# Patient Record
Sex: Male | Born: 1965 | Race: White | Hispanic: No | Marital: Single | Smoking: Never smoker
Health system: Southern US, Community
[De-identification: ages and names within clinical notes are randomized; demographics above are authoritative.]

## PROBLEM LIST (undated history)

## (undated) ENCOUNTER — Emergency Department (HOSPITAL_BASED_OUTPATIENT_CLINIC_OR_DEPARTMENT_OTHER): Discharge status: Absent without leave | Payer: Self-pay

## (undated) DIAGNOSIS — Z8669 Personal history of other diseases of the nervous system and sense organs: Secondary | ICD-10-CM

## (undated) DIAGNOSIS — E119 Type 2 diabetes mellitus without complications: Secondary | ICD-10-CM

## (undated) DIAGNOSIS — M1990 Unspecified osteoarthritis, unspecified site: Secondary | ICD-10-CM

## (undated) DIAGNOSIS — R519 Headache, unspecified: Secondary | ICD-10-CM

## (undated) DIAGNOSIS — G510 Bell's palsy: Secondary | ICD-10-CM

## (undated) HISTORY — DX: Personal history of other diseases of the nervous system and sense organs: Z86.69

## (undated) HISTORY — DX: Bell's palsy: G51.0

## (undated) HISTORY — PX: VENTRAL HERNIA REPAIR: SHX424

## (undated) HISTORY — PX: KNEE RECONSTRUCTION: SHX5883

---

## 2006-06-15 ENCOUNTER — Encounter: Admission: RE | Admit: 2006-06-15 | Discharge: 2006-06-15 | Payer: Self-pay | Admitting: Internal Medicine

## 2012-08-03 ENCOUNTER — Emergency Department: Payer: Self-pay | Admitting: Emergency Medicine

## 2018-05-12 DIAGNOSIS — I639 Cerebral infarction, unspecified: Secondary | ICD-10-CM

## 2018-05-12 HISTORY — DX: Cerebral infarction, unspecified: I63.9

## 2019-04-05 ENCOUNTER — Other Ambulatory Visit: Payer: Self-pay

## 2019-04-05 DIAGNOSIS — Z20822 Contact with and (suspected) exposure to covid-19: Secondary | ICD-10-CM

## 2019-04-06 LAB — NOVEL CORONAVIRUS, NAA: SARS-CoV-2, NAA: NOT DETECTED

## 2019-10-20 ENCOUNTER — Other Ambulatory Visit: Payer: Self-pay

## 2019-10-20 ENCOUNTER — Ambulatory Visit
Admission: EM | Admit: 2019-10-20 | Discharge: 2019-10-20 | Disposition: A | Discharge status: Home / Self Care | Payer: BC Managed Care – PPO | Attending: Physician Assistant | Admitting: Physician Assistant

## 2019-10-20 DIAGNOSIS — R0981 Nasal congestion: Secondary | ICD-10-CM | POA: Diagnosis not present

## 2019-10-20 DIAGNOSIS — J029 Acute pharyngitis, unspecified: Secondary | ICD-10-CM

## 2019-10-20 DIAGNOSIS — R070 Pain in throat: Secondary | ICD-10-CM

## 2019-10-20 DIAGNOSIS — R0982 Postnasal drip: Secondary | ICD-10-CM

## 2019-10-20 MED ORDER — IPRATROPIUM BROMIDE 0.06 % NA SOLN: 2.0000 | Freq: Four times a day (QID) | NASAL | 0 refills | Status: DC

## 2019-10-20 MED ORDER — FLUTICASONE PROPIONATE 50 MCG/ACT NA SUSP: 2.0000 | Freq: Every day | NASAL | 0 refills | Status: DC

## 2019-10-20 MED ORDER — LIDOCAINE VISCOUS HCL 2 % MT SOLN: 15.0000 mL | OROMUCOSAL | 0 refills | Status: DC | PRN

## 2019-10-20 NOTE — ED Provider Notes (Signed)
[facility]    CSN: [id number] Arrival date & time: 10/20/19  1523      History   Chief Complaint Chief Complaint  Patient presents with  . Sore Throat    HPI Matthew Jennings is a 54 y.o. male.   54 year old male comes in for few day of URI symptoms. Sore throat, post nasal drip, minimal cough, rhinorrhea, nasal congestion. Painful swallowing without tripoding, drooling, trismus. Denies fever, chills, body aches. Denies abdominal pain, nausea, vomiting, diarrhea. Denies shortness of breath, loss of taste/smell. Never smoker. otc cold medicine with some relief.      History reviewed. No pertinent past medical history.  There are no problems to display for this patient.   History reviewed. No pertinent surgical history.     Home Medications    Prior to Admission medications   Medication Sig Start Date End Date Taking? Authorizing Provider  fluticasone (FLONASE) 50 MCG/ACT nasal spray Place 2 sprays into both nostrils daily. 10/20/19   Tasia Catchings, Clifton Safley V, PA-C  ipratropium (ATROVENT) 0.06 % nasal spray Place 2 sprays into both nostrils 4 (four) times daily. 10/20/19   Tasia Catchings, Thaxton Pelley V, PA-C  lidocaine (XYLOCAINE) 2 % solution Use as directed 15 mLs in the mouth or throat as needed for mouth pain. 10/20/19   Ok Edwards, PA-C    Family History History reviewed. No pertinent family history.  Social History Social History   Tobacco Use  . Smoking status: Never Smoker  . Smokeless tobacco: Never Used  Substance Use Topics  . Alcohol use: Yes    Comment: occ  . Drug use: Not Currently     Allergies   Penicillins   Review of Systems Review of Systems  Reason unable to perform ROS: See HPI as above.     Physical Exam Triage Vital Signs ED Triage Vitals  Enc Vitals Group     BP 10/20/19 1536 136/88     Pulse Rate 10/20/19 1536 84     Resp 10/20/19 1536 18     Temp 10/20/19 1536 98.2 F (36.8 C)     Temp Source 10/20/19 1536 Oral     SpO2 10/20/19 1536 96 %      Weight --      Height --      Head Circumference --      Peak Flow --      Pain Score 10/20/19 1538 4     Pain Loc --      Pain Edu? --      Excl. in [place]? --    No data found.  Updated Vital Signs BP 136/88 (BP Location: Left Arm)   Pulse 84   Temp 98.2 F (36.8 C) (Oral)   Resp 18   SpO2 96%   Visual Acuity Right Eye Distance:   Left Eye Distance:   Bilateral Distance:    Right Eye Near:   Left Eye Near:    Bilateral Near:     Physical Exam Constitutional:      General: He is not in acute distress.    Appearance: He is well-developed. He is not ill-appearing, toxic-appearing or diaphoretic.  HENT:     Head: Normocephalic and atraumatic.     Right Ear: Tympanic membrane, ear canal and external ear normal. Tympanic membrane is not erythematous or bulging.     Left Ear: Tympanic membrane, ear canal and external ear normal. Tympanic membrane is not erythematous or bulging.     Nose:  Septal deviation present.     Right Sinus: No maxillary sinus tenderness or frontal sinus tenderness.     Left Sinus: No maxillary sinus tenderness or frontal sinus tenderness.     Mouth/Throat:     Mouth: Mucous membranes are moist.     Pharynx: Oropharynx is clear. Uvula midline.  Eyes:     Conjunctiva/sclera: Conjunctivae normal.     Pupils: Pupils are equal, round, and reactive to light.  Cardiovascular:     Rate and Rhythm: Normal rate and regular rhythm.  Pulmonary:     Effort: Pulmonary effort is normal. No accessory muscle usage, prolonged expiration, respiratory distress or retractions.     Breath sounds: No decreased air movement or transmitted upper airway sounds. No decreased breath sounds.     Comments: LCTAB Musculoskeletal:     Cervical back: Normal range of motion and neck supple.  Skin:    General: Skin is warm and dry.  Neurological:     Mental Status: He is alert and oriented to person, place, and time.      UC Treatments / Results  Labs (all labs ordered  are listed, but only abnormal results are displayed) Labs Reviewed  NOVEL CORONAVIRUS, NAA    EKG   Radiology No results found.  Procedures Procedures (including critical care time)  Medications Ordered in UC Medications - No data to display  Initial Impression / Assessment and Plan / UC Course  I have reviewed the triage vital signs and the nursing notes.  Pertinent labs & imaging results that were available during my care of the patient were reviewed by me and considered in my medical decision making (see chart for details).    COVID PCR test ordered. Patient to quarantine until testing results return. No alarming signs on exam.  Patient speaking in full sentences without respiratory distress.  Symptomatic treatment discussed.  Push fluids.  Return precautions given.  Patient expresses understanding and agrees to plan.  Final Clinical Impressions(s) / UC Diagnoses   Final diagnoses:  Nasal congestion  Sore throat  Post-nasal drip    ED Prescriptions    Medication Sig Dispense Auth. Provider   lidocaine (XYLOCAINE) 2 % solution Use as directed 15 mLs in the mouth or throat as needed for mouth pain. 100 mL Camela Wich V, PA-C   fluticasone (FLONASE) 50 MCG/ACT nasal spray Place 2 sprays into both nostrils daily. 1 g Jasmon Mattice V, PA-C   ipratropium (ATROVENT) 0.06 % nasal spray Place 2 sprays into both nostrils 4 (four) times daily. 15 mL Belinda Fisher, PA-C     PDMP not reviewed this encounter.   Belinda Fisher, PA-C 10/20/19 1630

## 2019-10-20 NOTE — ED Triage Notes (Signed)
Pt c/o sore throat and post nasal drip x2 days. States started with a cough and has now subsided.

## 2019-10-20 NOTE — Discharge Instructions (Signed)
COVID PCR testing ordered. I would like you to quarantine until testing results. Start lidocaine for sore throat, do not eat or drink for the next 40 mins after use as it can stunt your gag reflex. Start flonase, atrovent nasal spray for nasal congestion/drainage. You can use over the counter nasal saline rinse such as neti pot for nasal congestion. Keep hydrated, your urine should be clear to pale yellow in color. Tylenol/motrin for fever and pain. Monitor for any worsening of symptoms, chest pain, shortness of breath, wheezing, swelling of the throat, go to the emergency department for further evaluation needed.   

## 2019-10-21 LAB — SARS-COV-2, NAA 2 DAY TAT

## 2019-10-21 LAB — NOVEL CORONAVIRUS, NAA: SARS-CoV-2, NAA: NOT DETECTED

## 2019-10-23 ENCOUNTER — Telehealth: Payer: Self-pay

## 2019-10-23 NOTE — Telephone Encounter (Signed)
Patient's call was returned and he was notified of negative COVID results. Understanding verbalized by the patient.

## 2020-05-17 ENCOUNTER — Other Ambulatory Visit: Payer: Self-pay

## 2020-05-17 ENCOUNTER — Emergency Department: Payer: Self-pay

## 2020-05-17 ENCOUNTER — Emergency Department
Admission: EM | Admit: 2020-05-17 | Discharge: 2020-05-17 | Disposition: A | Discharge status: Home / Self Care | Payer: Self-pay | Attending: Emergency Medicine | Admitting: Emergency Medicine

## 2020-05-17 DIAGNOSIS — G510 Bell's palsy: Secondary | ICD-10-CM | POA: Insufficient documentation

## 2020-05-17 DIAGNOSIS — E119 Type 2 diabetes mellitus without complications: Secondary | ICD-10-CM | POA: Insufficient documentation

## 2020-05-17 HISTORY — DX: Type 2 diabetes mellitus without complications: E11.9

## 2020-05-17 LAB — COMPREHENSIVE METABOLIC PANEL
ALT: 33 U/L (ref 0–44)
AST: 23 U/L (ref 15–41)
Albumin: 4.6 g/dL (ref 3.5–5.0)
Alkaline Phosphatase: 99 U/L (ref 38–126)
Anion gap: 11 (ref 5–15)
BUN: 17 mg/dL (ref 6–20)
CO2: 25 mmol/L (ref 22–32)
Calcium: 9.8 mg/dL (ref 8.9–10.3)
Chloride: 101 mmol/L (ref 98–111)
Creatinine, Ser: 0.84 mg/dL (ref 0.61–1.24)
GFR, Estimated: >60 mL/min (ref >60)
Glucose, Bld: 348 mg/dL — ABNORMAL HIGH (ref 70–99)
Potassium: 4.2 mmol/L (ref 3.5–5.1)
Sodium: 137 mmol/L (ref 135–145)
Total Bilirubin: 0.6 mg/dL (ref 0.3–1.2)
Total Protein: 8.3 g/dL — ABNORMAL HIGH (ref 6.5–8.1)

## 2020-05-17 LAB — DIFFERENTIAL
Abs Immature Granulocytes: 0.01 10*3/uL (ref 0.00–0.07)
Basophils Absolute: 0 10*3/uL (ref 0.0–0.1)
Basophils Relative: 1 %
Eosinophils Absolute: 0.1 10*3/uL (ref 0.0–0.5)
Eosinophils Relative: 2 %
Immature Granulocytes: 0 %
Lymphocytes Relative: 32 %
Lymphs Abs: 1.8 10*3/uL (ref 0.7–4.0)
Monocytes Absolute: 0.5 10*3/uL (ref 0.1–1.0)
Monocytes Relative: 8 %
Neutro Abs: 3.3 10*3/uL (ref 1.7–7.7)
Neutrophils Relative %: 57 %

## 2020-05-17 LAB — CBC
HCT: 43.2 % (ref 39.0–52.0)
Hemoglobin: 14.9 g/dL (ref 13.0–17.0)
MCH: 29.5 pg (ref 26.0–34.0)
MCHC: 34.5 g/dL (ref 30.0–36.0)
MCV: 85.5 fL (ref 80.0–100.0)
Platelets: 205 10*3/uL (ref 150–400)
RBC: 5.05 MIL/uL (ref 4.22–5.81)
RDW: 12.5 % (ref 11.5–15.5)
WBC: 5.7 10*3/uL (ref 4.0–10.5)
nRBC: 0 % (ref 0.0–0.2)

## 2020-05-17 LAB — PROTIME-INR
INR: 0.9 (ref 0.8–1.2)
Prothrombin Time: 12.2 seconds (ref 11.4–15.2)

## 2020-05-17 LAB — APTT: aPTT: 25 seconds (ref 24–36)

## 2020-05-17 MED ORDER — GADOBUTROL 1 MMOL/ML IV SOLN
7.0000 mL | Freq: Once | INTRAVENOUS | Status: AC | PRN
  Administered 2020-05-17: 7 mL via INTRAVENOUS
  Filled 2020-05-17: qty 7.5

## 2020-05-17 MED ORDER — VALACYCLOVIR HCL 1 G PO TABS: 1000.0000 mg | ORAL_TABLET | Freq: Three times a day (TID) | ORAL | 0 refills | Status: AC

## 2020-05-17 MED ORDER — PREDNISONE 20 MG PO TABS: 60.0000 mg | ORAL_TABLET | Freq: Every day | ORAL | 0 refills | Status: AC

## 2020-05-17 NOTE — ED Provider Notes (Signed)
[facility] Emergency Department Provider Note   ____________________________________________   Event Date/Time   First MD Initiated Contact with Patient 05/17/20 1419     (approximate)  I have reviewed the triage vital signs and the nursing notes.   HISTORY  Chief Complaint Facial Droop    HPI Matthew Jennings is a 55 y.o. male here for evaluation of left facial droop  Patient reports that he woke up Wednesday morning and noticed the left side of his face was drooping including his eyelid.  No fevers or chills no chest pain.  No trouble walking.  No trouble speaking except his mouth will close all the way on the left side and he drools sometimes if he tries to eat things.  Additionally his left eye feels little bit dry.  No vision changes.  Does report a feeling of numbness across the thumb left hand as well reporting it feels like a different sensation in his hand on the left side  No nausea or vomiting.  He suffers from regular and recurrent migraines, reports he last had one a few days ago not having headache now  No chest pain no trouble breathing.  No history of stroke.  Denies medical history   Past Medical History:  Diagnosis Date  . Diabetes mellitus without complication (HCC)     There are no problems to display for this patient.   History reviewed. No pertinent surgical history.  Prior to Admission medications   Medication Sig Start Date End Date Taking? Authorizing Provider  fluticasone (FLONASE) 50 MCG/ACT nasal spray Place 2 sprays into both nostrils daily. 10/20/19   Cathie Hoops, Amy V, PA-C  ipratropium (ATROVENT) 0.06 % nasal spray Place 2 sprays into both nostrils 4 (four) times daily. 10/20/19   Cathie Hoops, Amy V, PA-C  lidocaine (XYLOCAINE) 2 % solution Use as directed 15 mLs in the mouth or throat as needed for mouth pain. 10/20/19   Cathie Hoops, Amy V, PA-C    Allergies Penicillins  No family history on file.  Social History Social History    Tobacco Use  . Smoking status: Never Smoker  . Smokeless tobacco: Never Used  Substance Use Topics  . Alcohol use: Yes    Comment: occ  . Drug use: Not Currently    Review of Systems Constitutional: No fever/chills Eyes: No visual changes except left eye feels dry in the morning. ENT: No sore throat.  No neck pain. Cardiovascular: Denies chest pain. Respiratory: Denies shortness of breath. Gastrointestinal: No abdominal pain.   Genitourinary: Negative for dysuria. Musculoskeletal: Negative for back pain. Skin: Negative for rash. Neurological: Negative for headaches.    ____________________________________________   PHYSICAL EXAM:  VITAL SIGNS: ED Triage Vitals  Enc Vitals Group     BP 05/17/20 1305 (!) 145/89     Pulse Rate 05/17/20 1305 86     Resp 05/17/20 1305 16     Temp 05/17/20 1305 98.5 F (36.9 C)     Temp Source 05/17/20 1305 Oral     SpO2 05/17/20 1305 97 %     Weight 05/17/20 1406 160 lb (72.6 kg)     Height 05/17/20 1406 5\' 10"  (1.778 m)     Head Circumference --      Peak Flow --      Pain Score 05/17/20 1406 0     Pain Loc --      Pain Edu? --      Excl. in [place]? --  Constitutional: Alert and oriented. Well appearing and in no acute distress. Eyes: Conjunctivae are normal. Head: Atraumatic. Nose: No congestion/rhinnorhea.  Nose is slightly deformed towards the right side which he reports is chronic from previous injuries.  No rash on the nose or face. Mouth/Throat: Mucous membranes are moist. Neck: No stridor.  Cardiovascular: Normal rate, regular rhythm. Grossly normal heart sounds.  Good peripheral circulation. Respiratory: Normal respiratory effort.  No retractions. Lungs CTAB. Gastrointestinal: Soft and nontender. No distention. Musculoskeletal: No lower extremity tenderness nor edema. Neurologic:  Normal speech and language. No gross focal neurologic deficits are appreciated except for left-sided facial droop and cranial nerve VII  palsy on the left.  Additionally he does report slight difference in discrimination to touch over the left hand compared to the right but demonstrates good and normal motor use of all 4 extremities.  No ataxia in extremity.  Normal extraocular movements.  NIH score equals = 4 (fairly severe left facial palsy).  Skin:  Skin is warm, dry and intact. No rash noted. Psychiatric: Mood and affect are normal. Speech and behavior are normal.  ____________________________________________   LABS (all labs ordered are listed, but only abnormal results are displayed)  Labs Reviewed  COMPREHENSIVE METABOLIC PANEL - Abnormal; Notable for the following components:      Result Value   Glucose, Bld 348 (*)    Total Protein 8.3 (*)    All other components within normal limits  PROTIME-INR  APTT  CBC  DIFFERENTIAL   ____________________________________________  EKG  Reviewed interpreted 1415 Heart rate 70 QRS 80 QTc 440 Normal sinus rhythm, possible LVH.  No evidence of acute ischemia ____________________________________________  RADIOLOGY  MRI pending at time of signout to Dr. Derrill Kay will follow up ____________________________________________   PROCEDURES  Procedure(s) performed: None  Procedures  Critical Care performed: No  ____________________________________________   INITIAL IMPRESSION / ASSESSMENT AND PLAN / ED COURSE  Pertinent labs & imaging results that were available during my care of the patient were reviewed by me and considered in my medical decision making (see chart for details).   Patient with left-sided facial droop, appears most consistent with Bell's palsy.  No rashes noted.  No eye pain.  No evidence of acute shingles or herpetic outbreak.  Additional on the differential given he does report some paresthesia of the left hand would also include other central cause such as stroke.  Primarily suspect Bell's, but will obtain MRI to evaluate further if this is  negative for stroke or other acute cause I would treat as possible Bell's palsy.  No acute cardiac pulmonary or infectious symptoms.  Clinical Course as of 05/17/20 1734  Thu May 17, 2020  1616 MRI reports patient in queue and pending MRI [MQ]    Clinical Course User Index [MQ] Sharyn Creamer, MD    ----------------------------------------- 5:34 PM on 05/17/2020 -----------------------------------------  Ongoing care assigned to Dr. Derrill Kay including follow-up on MRI of the brain.  If reassuring her normal MRI of the brain I would recommend discharge with treatment for Bell's palsy. ____________________________________________   FINAL CLINICAL IMPRESSION(S) / ED DIAGNOSES  Final diagnoses:  Left-sided Bell's palsy        Note:  This document was prepared using Dragon voice recognition software and may include unintentional dictation errors       Sharyn Creamer, MD 05/17/20 1734

## 2020-05-17 NOTE — ED Triage Notes (Signed)
Pt c/o left sided facial droop and numbness for the past 2 days, patient is unable to control left eye lid movement, denies any pain or numbness any where else. Pt is ambulatory to triage with a steady gait. Speech is clear.

## 2020-05-17 NOTE — Discharge Instructions (Addendum)
Please seek medical attention for any high fevers, chest pain, shortness of breath, change in behavior, persistent vomiting, bloody stool or any other new or concerning symptoms.  

## 2020-05-25 ENCOUNTER — Ambulatory Visit (INDEPENDENT_AMBULATORY_CARE_PROVIDER_SITE_OTHER): Payer: Self-pay | Admitting: Family

## 2020-05-25 ENCOUNTER — Other Ambulatory Visit: Payer: Self-pay

## 2020-05-25 ENCOUNTER — Encounter: Payer: Self-pay | Admitting: Family

## 2020-05-25 VITALS — BP 146/88 | HR 92 | Ht 70.0 in | Wt 169.4 lb

## 2020-05-25 DIAGNOSIS — G510 Bell's palsy: Secondary | ICD-10-CM

## 2020-05-25 DIAGNOSIS — Z09 Encounter for follow-up examination after completed treatment for conditions other than malignant neoplasm: Secondary | ICD-10-CM

## 2020-05-25 DIAGNOSIS — Z7689 Persons encountering health services in other specified circumstances: Secondary | ICD-10-CM

## 2020-05-25 NOTE — Patient Instructions (Signed)
Return in 4 to 6 weeks or sooner if needed for annual physical examination, labs, and health maintenance. Arrive fasting meaning having had no food and/or nothing to drink for at least 8 hours prior to appointment.  Continue Prednisone and Valcyclovir for Bell's Palsy.   Referral to Otolaryngology for Bell's Palsy.  Continue Naproxen for headaches.  Thank you for choosing Primary Care at [facility] for your medical home!    Matthew Jennings was seen by Rema Fendt, NP today.   Lyman Speller Seydel's primary care provider is Amyjo Mizrachi Jodi Geralds, NP.   For the best care possible,  you should try to see Ricky Stabs, NP whenever you come to clinic.   We look forward to seeing you again soon!  If you have any questions about your visit today,  please call us at [phone]  Or feel free to reach your provider via MyChart.    Bell's Palsy, Adult  Bell's palsy is a short-term inability to move muscles in a part of the face. The inability to move, also called paralysis, results from inflammation or compression of the seventh cranial nerve. This nerve travels along the skull and under the ear to the side of the face. This nerve is responsible for facial movements that include blinking, closing the eyes, smiling, and frowning. What are the causes? The exact cause of this condition is not known. It may be caused by an infection from a virus, such as the chickenpox (herpes zoster), Epstein-Barr, or mumps virus. What increases the risk? You are more likely to develop this condition if:  You are pregnant.  You have diabetes.  You have had a recent infection in your nose, throat, or airways.  You have a weakened body defense system (immune system).  You have had a facial injury, such as a fracture.  You have a family history of Bell's palsy. What are the signs or symptoms? Symptoms of this condition include:  Weakness on one side of the face.  Drooping eyelid and corner of the  mouth.  Excessive tearing in one eye.  Difficulty closing the eyelid.  Dry eye.  Drooling.  Dry mouth.  Changes in taste.  Change in facial appearance.  Pain behind one ear.  Ringing in one or both ears.  Sensitivity to sound in one ear.  Facial twitching.  Headache.  Impaired speech.  Dizziness.  Difficulty eating or drinking. Most of the time, only one side of the face is affected. In rare cases, Bell's palsy may affect the whole face. How is this diagnosed? This condition is diagnosed based on:  Your symptoms.  Your medical history.  A physical exam. You may also have to see health care providers who specialize in disorders of the nerves (neurologist) or diseases and conditions of the eye (ophthalmologist). You may have tests, such as:  A test to check for nerve damage (electromyogram).  Imaging studies, such as a CT scan or an MRI.  Blood tests. How is this treated? This condition affects every person differently. Sometimes symptoms go away without treatment within a couple weeks. If treatment is needed, it varies from person to person. The goal of treatment is to reduce inflammation and protect the eye from damage. Treatment for Bell's palsy may include:  Medicines, such as: ? Steroids to reduce swelling and inflammation. ? Antiviral medicines. ? Pain relievers, including aspirin, acetaminophen, or ibuprofen.  Eye drops or ointment to keep your eye moist.  Eye protection, if you cannot  close your eye.  Exercises or massage to regain muscle strength and function (physical therapy). Follow these instructions at home:  Take over-the-counter and prescription medicines only as told by your health care provider.  If your eye is affected: ? Keep your eye moist with eye drops or ointment as told by your health care provider. ? Follow instructions for eye care and protection as told by your health care provider.  Do any physical therapy exercises as  told by your health care provider.  Keep all follow-up visits. This is important.   Contact a health care provider if:  You have a fever or chills.  Your symptoms do not get better within 2-3 weeks, or your symptoms get worse.  Your eye is red, irritated, or painful.  You have new symptoms. Get help right away if:  You have weakness or numbness in a part of your body other than your face.  You have trouble swallowing.  You develop neck pain or stiffness.  You develop dizziness or shortness of breath. Summary  Bell's palsy is a short-term inability to move muscles in a part of the face. The inability to move results from inflammation or compression of the facial nerve.  This condition affects every person differently. Sometimes symptoms go away without treatment within a couple weeks.  If treatment is needed, it varies from person to person. The goal of treatment is to reduce inflammation and protect the eye from damage.  Contact your health care provider if your symptoms do not get better within 2-3 weeks, or your symptoms get worse. This information is not intended to replace advice given to you by your health care provider. Make sure you discuss any questions you have with your health care provider. Document Revised: 01/26/2020 Document Reviewed: 01/26/2020 Elsevier Patient Education  2021 [employer].

## 2020-05-25 NOTE — Progress Notes (Signed)
Subjective:    Matthew Jennings - 55 y.o. male MRN [id number]  Date of birth: 1965-09-02  HPI  Matthew Jennings is to establish care. Patient has no significant PMH.  Current issues and/or concerns: 1. ER FOLLOW UP: 05/17/2020 (per MD hospital discharge note): Visit at the [facility] Emergency Department. Patient with left-sided facial droop, appears most consistent with Bell's palsy.  No rashes noted.  No eye pain.  No evidence of acute shingles or herpetic outbreak.  Additional on the differential given he does report some paresthesia of the left hand would also include other central cause such as stroke. Primarily suspect Bell's, but will obtain MRI to evaluate further if this is negative for stroke or other acute cause I would treat as possible Bell's palsy. No acute cardiac pulmonary or infectious symptoms.   05/25/2020: Time since discharge: 8 days Hospital/facility: [facility] Emergency Department Diagnosis: left sided Bell's palsy Procedures/tests: CMP, Protime-INR, APTT, CBC DIFFERENTIAL, EKG, MRI Consultants: none New medications: Fluticasone propionate, Ipatropium bromide, Lidocaine HCl 2%, Valacyclovir HCl, Prednisone  Discharge instructions: follow-up with Dr. Jenne Campus (Otolaryngology) Status: a little better Today reports he has gotten a little better since hospital discharge. Still having problems with left eye. Reports sometimes its difficult to close the eye and sometimes it doesn't want to close at all. Reports its difficult to focus with one eye. Having light sensitivity and fluctuation of perception, wearing sunshades to help with this. Tongue still numb and difficulty speaking. Numbness in forearm and thumb. Still taking medication and has several days left of this.   ROS per HPI   Health Maintenance:  Health Maintenance Due  Topic Date Due  . Hepatitis C Screening  Never done  . HIV Screening  Never done  . TETANUS/TDAP   Never done  . COLONOSCOPY (Pts 45-29yrs Insurance coverage will need to be confirmed)  Never done    Past Medical History: There are no problems to display for this patient.   Social History   reports that he is a non-smoker but has been exposed to tobacco smoke. He has never used smokeless tobacco. He reports current alcohol use. He reports previous drug use.   Family History  family history includes Alzheimer's disease in his father; Cancer in his mother.   Medications: reviewed and updated   Objective:   Physical Exam BP (!) 146/88 (BP Location: Right Arm, Patient Position: Sitting)   Pulse 92   Ht 5\' 10"  (1.778 m)   Wt 169 lb 6.4 oz (76.8 kg)   SpO2 96%   BMI 24.31 kg/m  Physical Exam HENT:     Head: Normocephalic.     Comments: Left -sided facial paralysis; left eyelid unable to close; unable to smile/frown on left side of face.    Mouth/Throat:     Mouth: Mucous membranes are moist.  Cardiovascular:     Rate and Rhythm: Normal rate and regular rhythm.     Pulses: Normal pulses.     Heart sounds: Normal heart sounds.  Pulmonary:     Effort: Pulmonary effort is normal.     Breath sounds: Normal breath sounds.  Neurological:     General: No focal deficit present.     Mental Status: He is alert and oriented to person, place, and time.  Psychiatric:        Mood and Affect: Mood normal.        Behavior: Behavior normal.     Assessment & Plan:  1. Encounter to establish care: - Patient presents today to establish care.  - Return in 4 to 6 weeks or sooner if needed for annual physical examination, labs, and health maintenance. Arrive fasting meaning having had no food and/or nothing to drink for at least 8 hours prior to appointment. - Offered patient [employer] financial discount/orange card and blue card application. Counseled patient will need to have an appointment with the financial counselor for processing of documentation. Patient agreeable.   2. Hospital  discharge follow-up: 3. Left-sided Bell's palsy: - Visit on 05/17/2020 at the [facility] Emergency Department. Patient with left-sided facial droop, appears most consistent with Bell's palsy. MRI to evaluate further if this is negative for stroke or other acute cause, unremarkable.  - Continue Prednisone and Valacyclovir as prescribed. - Referral to Ophthalmology for further evaluation and management. - Referral to Neurology for further evaluation and management.  - Referral to ENT for further evaluation and management. - Ambulatory referral to Ophthalmology - Ambulatory referral to Neurology - Ambulatory referral to ENT   Ricky Stabs, NP 05/25/2020, 5:13 PM Primary Care at [facility]

## 2020-05-25 NOTE — Progress Notes (Signed)
Establish care  Hospital f/u

## 2020-05-29 ENCOUNTER — Telehealth: Payer: Self-pay | Admitting: Family

## 2020-05-29 NOTE — Telephone Encounter (Signed)
Copied from CRM [id number]. Topic: Appointment Scheduling - Scheduling Inquiry for Clinic >> May 28, 2020 10:25 AM Wyonia Hough E wrote: Reason for CRM: pt called to schedule appt with Mikle Bosworth for [facility] assistance /please advise   Called patient and LVM x3 advising him to call back [phone] to schedule an appointment with Mikle Bosworth.

## 2020-05-30 NOTE — Telephone Encounter (Signed)
I return Pt call, LVM to pls call us back to schedule a financial appt also remind him that he need to bring to the appt the application as well all the documentation he need to qualified for the program

## 2020-05-30 NOTE — Telephone Encounter (Signed)
PT returning a call from [place] / his very upset, his demanding to speak with him now / please advise

## 2020-06-11 ENCOUNTER — Ambulatory Visit: Payer: Self-pay

## 2020-06-11 ENCOUNTER — Other Ambulatory Visit: Payer: Self-pay

## 2020-06-14 ENCOUNTER — Telehealth: Payer: Self-pay | Admitting: Family

## 2020-06-14 NOTE — Telephone Encounter (Signed)
I called the Pt since received an email from [facility] Financial Account pending with [employer]source. Removed from WQ., Pt was inform to contact [employer] to see if he qualified for Medicaid or not and the pend of the information he will get to let us know

## 2020-06-15 ENCOUNTER — Telehealth: Payer: Self-pay | Admitting: Family

## 2020-06-15 NOTE — Progress Notes (Unsigned)
I return Pt call, will contact [facility] financial on Monday to check on [employer] note and to review the CAFA application

## 2020-06-15 NOTE — Telephone Encounter (Signed)
Copied from CRM [id number]. Topic: General - Inquiry >> Jun 13, 2020  4:28 PM Adrian Prince D wrote: Reason for CRM: Patient would like a call back from [facility], he has some questions. He can be reached at [phone]. Please advise

## 2020-06-22 ENCOUNTER — Telehealth: Payer: Self-pay | Admitting: Family

## 2020-06-22 ENCOUNTER — Ambulatory Visit: Payer: Self-pay | Admitting: Otolaryngology

## 2020-06-22 NOTE — Telephone Encounter (Signed)
I return Pt call, he was inform that I was not sure that legal Aid can help but I was going to transfer to [facility] to see if she know anything

## 2020-06-22 NOTE — Telephone Encounter (Signed)
Call placed to patient to reschedule his appointment for today.   While on the phone he asked to be transferred to [facility]. I advised him that Mikle Bosworth was with a patient and could not take his call. His unemployment claim has been denied and he needs to speak with Mikle Bosworth regarding anything legal aid may can do to help him out. Please advise.

## 2020-06-25 ENCOUNTER — Telehealth: Payer: Self-pay

## 2020-06-25 NOTE — Telephone Encounter (Signed)
Patient contacted office to notify [facility] office of an upcoming phone number change.

## 2020-06-29 ENCOUNTER — Ambulatory Visit: Payer: Self-pay | Attending: Otolaryngology | Admitting: Otolaryngology

## 2020-06-29 ENCOUNTER — Other Ambulatory Visit: Payer: Self-pay

## 2020-06-29 VITALS — BP 141/92 | HR 81 | Ht 70.0 in | Wt 167.4 lb

## 2020-06-29 DIAGNOSIS — R29898 Other symptoms and signs involving the musculoskeletal system: Secondary | ICD-10-CM

## 2020-06-29 DIAGNOSIS — G510 Bell's palsy: Secondary | ICD-10-CM

## 2020-06-29 DIAGNOSIS — H169 Unspecified keratitis: Secondary | ICD-10-CM

## 2020-06-29 NOTE — Progress Notes (Signed)
States that the left side of his tongue is numb and he often bites it when he eats.

## 2020-06-29 NOTE — Patient Instructions (Signed)
You likely had ordinary left Bell's palsy facial weakness.  The MRI scan at [facility] did show inflammation of the nerve without any obvious tumors.    Right now the upper face is working better than the lower face.  I expect this to resolve completely over the next month or so.  In the meantime, tape your left eye shut at night so it does not dry out.  I would like to see you back in 1 month.  If the vision remains blurry, you probably should see an ophthalmologist.  I think seeing a neurologist is a good choice also, especially as regards your left arm.

## 2020-06-29 NOTE — Progress Notes (Signed)
Chief complaint: Left facial weakness  History: 55 year old white male with a history remarkable for occasional migraine headaches awakened 1 month ago with complete paralysis of the left face.  He had some pain in the ear and upper neck region.  No change in hearing.  No trauma.  No ear infection.  No prior similar occurrence.  He did have some abnormal sensation on the left tongue.  He was seen at [facility] emergency room where an MRI scan with contrast showed inflammation in the internal auditory canal.  Contemporaneously, he has noticed some numbness along his first and fifth digits on the left hand, and some overall weakness of his hand.   1 month later, he is having slight improvement in his facial function but still having trouble biting his cheek, and dribbling when he eats. Examination: He is trim and basically healthy although anxious.  Mental status is appropriate.  He hears well in conversational speech.  Voice is clear and respirations unlabored through the nose.  The head is atraumatic and neck supple.  Right ear canal has a moderate wax accumulation, nonobstructive.  Left ear canal has mild wax accumulation.  Both drums appear normal and aerated.  Internal nose is moist and patent.  Oral cavity is moist with teeth in good repair.  Oropharynx is clear.  No vesicles.  Neck without adenopathy.  He has nearly complete and symmetric forehead function on both sides.  He has fairly good passive and active closure of the left eye.  He has a very weak lower face.  Cranial nerves are otherwise intact.  No obvious conjunctival injection.  Impression: Left cranial polyganglion-itis (Bell's palsy).  Possible resolving exposure keratitis.  Weakness of the left hand and arm of uncertain significance.  Plan: I taught him how to tape his eye shut at night to avoid further exposure problems.  I would like to see him back in 1 month.  If he is not having a complete response, we may need to repeat the  MRI scan.  I do think a neurologist is of value at this point.

## 2020-07-06 ENCOUNTER — Ambulatory Visit (INDEPENDENT_AMBULATORY_CARE_PROVIDER_SITE_OTHER): Payer: Self-pay | Admitting: Family

## 2020-07-06 ENCOUNTER — Other Ambulatory Visit: Payer: Self-pay

## 2020-07-06 ENCOUNTER — Encounter: Payer: Self-pay | Admitting: Family

## 2020-07-06 VITALS — BP 146/89 | HR 86 | Ht 70.0 in | Wt 165.0 lb

## 2020-07-06 DIAGNOSIS — Z13220 Encounter for screening for lipoid disorders: Secondary | ICD-10-CM

## 2020-07-06 DIAGNOSIS — E119 Type 2 diabetes mellitus without complications: Secondary | ICD-10-CM

## 2020-07-06 DIAGNOSIS — Z114 Encounter for screening for human immunodeficiency virus [HIV]: Secondary | ICD-10-CM

## 2020-07-06 DIAGNOSIS — Z130 Encounter for screening for diseases of the blood and blood-forming organs and certain disorders involving the immune mechanism: Secondary | ICD-10-CM

## 2020-07-06 DIAGNOSIS — Z1159 Encounter for screening for other viral diseases: Secondary | ICD-10-CM

## 2020-07-06 DIAGNOSIS — Z0000 Encounter for general adult medical examination without abnormal findings: Secondary | ICD-10-CM

## 2020-07-06 DIAGNOSIS — Z131 Encounter for screening for diabetes mellitus: Secondary | ICD-10-CM

## 2020-07-06 DIAGNOSIS — Z13228 Encounter for screening for other metabolic disorders: Secondary | ICD-10-CM

## 2020-07-06 DIAGNOSIS — G510 Bell's palsy: Secondary | ICD-10-CM

## 2020-07-06 DIAGNOSIS — Z0100 Encounter for examination of eyes and vision without abnormal findings: Secondary | ICD-10-CM

## 2020-07-06 DIAGNOSIS — Z1329 Encounter for screening for other suspected endocrine disorder: Secondary | ICD-10-CM

## 2020-07-06 DIAGNOSIS — Z1211 Encounter for screening for malignant neoplasm of colon: Secondary | ICD-10-CM

## 2020-07-06 DIAGNOSIS — E785 Hyperlipidemia, unspecified: Secondary | ICD-10-CM

## 2020-07-06 DIAGNOSIS — Z599 Problem related to housing and economic circumstances, unspecified: Secondary | ICD-10-CM

## 2020-07-06 DIAGNOSIS — R030 Elevated blood-pressure reading, without diagnosis of hypertension: Secondary | ICD-10-CM

## 2020-07-06 NOTE — Progress Notes (Signed)
Annual physical Needs eye doctor but most of them do not take CAFA card and he does not have cash for self pay

## 2020-07-06 NOTE — Patient Instructions (Addendum)
Annual physical exam and labs today. Will call with results.   Keep appointment with Neurology.   Follow-up with primary provider in 2 months or sooner if needed for blood pressure check.  Referral to Social Worker Jenel Lucks.  Patient given eye doctor resources to schedule appointment.  Hypertension, Adult Hypertension is another name for high blood pressure. High blood pressure forces your heart to work harder to pump blood. This can cause problems over time. There are two numbers in a blood pressure reading. There is a top number (systolic) over a bottom number (diastolic). It is best to have a blood pressure that is below 120/80. Healthy choices can help lower your blood pressure, or you may need medicine to help lower it. What are the causes? The cause of this condition is not known. Some conditions may be related to high blood pressure. What increases the risk?  Smoking.  Having type 2 diabetes mellitus, high cholesterol, or both.  Not getting enough exercise or physical activity.  Being overweight.  Having too much fat, sugar, calories, or salt (sodium) in your diet.  Drinking too much alcohol.  Having long-term (chronic) kidney disease.  Having a family history of high blood pressure.  Age. Risk increases with age.  Race. You may be at higher risk if you are African American.  Gender. Men are at higher risk than women before age 33. After age 15, women are at higher risk than men.  Having obstructive sleep apnea.  Stress. What are the signs or symptoms?  High blood pressure may not cause symptoms. Very high blood pressure (hypertensive crisis) may cause: ? Headache. ? Feelings of worry or nervousness (anxiety). ? Shortness of breath. ? Nosebleed. ? A feeling of being sick to your stomach (nausea). ? Throwing up (vomiting). ? Changes in how you see. ? Very bad chest pain. ? Seizures. How is this treated?  This condition is treated by making healthy  lifestyle changes, such as: ? Eating healthy foods. ? Exercising more. ? Drinking less alcohol.  Your health care provider may prescribe medicine if lifestyle changes are not enough to get your blood pressure under control, and if: ? Your top number is above 130. ? Your bottom number is above 80.  Your personal target blood pressure may vary. Follow these instructions at home: Eating and drinking  If told, follow the DASH eating plan. To follow this plan: ? Fill one half of your plate at each meal with fruits and vegetables. ? Fill one fourth of your plate at each meal with whole grains. Whole grains include whole-wheat pasta, brown rice, and whole-grain bread. ? Eat or drink low-fat dairy products, such as skim milk or low-fat yogurt. ? Fill one fourth of your plate at each meal with low-fat (lean) proteins. Low-fat proteins include fish, chicken without skin, eggs, beans, and tofu. ? Avoid fatty meat, cured and processed meat, or chicken with skin. ? Avoid pre-made or processed food.  Eat less than 1,500 mg of salt each day.  Do not drink alcohol if: ? Your doctor tells you not to drink. ? You are pregnant, may be pregnant, or are planning to become pregnant.  If you drink alcohol: ? Limit how much you use to:  0-1 drink a day for women.  0-2 drinks a day for men. ? Be aware of how much alcohol is in your drink. In the [place] one drink equals one 12 oz bottle of beer (355 mL), one 5 oz glass  of wine (148 mL), or one 1 oz glass of hard liquor (44 mL).   Lifestyle  Work with your doctor to stay at a healthy weight or to lose weight. Ask your doctor what the best weight is for you.  Get at least 30 minutes of exercise most days of the week. This may include walking, swimming, or biking.  Get at least 30 minutes of exercise that strengthens your muscles (resistance exercise) at least 3 days a week. This may include lifting weights or doing Pilates.  Do not use any products  that contain nicotine or tobacco, such as cigarettes, e-cigarettes, and chewing tobacco. If you need help quitting, ask your doctor.  Check your blood pressure at home as told by your doctor.  Keep all follow-up visits as told by your doctor. This is important.   Medicines  Take over-the-counter and prescription medicines only as told by your doctor. Follow directions carefully.  Do not skip doses of blood pressure medicine. The medicine does not work as well if you skip doses. Skipping doses also puts you at risk for problems.  Ask your doctor about side effects or reactions to medicines that you should watch for. Contact a doctor if you:  Think you are having a reaction to the medicine you are taking.  Have headaches that keep coming back (recurring).  Feel dizzy.  Have swelling in your ankles.  Have trouble with your vision. Get help right away if you:  Get a very bad headache.  Start to feel mixed up (confused).  Feel weak or numb.  Feel faint.  Have very bad pain in your: ? Chest. ? Belly (abdomen).  Throw up more than once.  Have trouble breathing. Summary  Hypertension is another name for high blood pressure.  High blood pressure forces your heart to work harder to pump blood.  For most people, a normal blood pressure is less than 120/80.  Making healthy choices can help lower blood pressure. If your blood pressure does not get lower with healthy choices, you may need to take medicine. This information is not intended to replace advice given to you by your health care provider. Make sure you discuss any questions you have with your health care provider. Document Revised: 01/06/2018 Document Reviewed: 01/06/2018 Elsevier Patient Education  2021 [employer].

## 2020-07-06 NOTE — Progress Notes (Signed)
Patient ID: Matthew Jennings, male     DOB: [birth year]  MRN: [id number]  CC: Annual Physical Exam   Subjective: Matthew Jennings is a 55 y.o. male who presents for annual physical exam. His concerns today include: financial concerns since being unemployed.    Current Outpatient Medications on File Prior to Visit  Medication Sig Dispense Refill  . naproxen sodium (ALEVE) 220 MG tablet Take 220 mg by mouth.     No current facility-administered medications on file prior to visit.    Allergies  Allergen Reactions  . Penicillins Rash    Social History   Socioeconomic History  . Marital status: Single    Spouse name: Not on file  . Number of children: Not on file  . Years of education: Not on file  . Highest education level: Not on file  Occupational History  . Not on file  Tobacco Use  . Smoking status: Passive Smoke Exposure - Never Smoker  . Smokeless tobacco: Never Used  Substance and Sexual Activity  . Alcohol use: Yes    Comment: occ  . Drug use: Not Currently  . Sexual activity: Not Currently  Other Topics Concern  . Not on file  Social History Narrative  . Not on file   Social Determinants of Health   Financial Resource Strain: Not on file  Food Insecurity: Not on file  Transportation Needs: Not on file  Physical Activity: Not on file  Stress: Not on file  Social Connections: Not on file  Intimate Partner Violence: Not on file    Family History  Problem Relation Age of Onset  . Cancer Mother   . Alzheimer's disease Father     Past Surgical History:  Procedure Laterality Date  . KNEE RECONSTRUCTION Left     ROS: Review of Systems Negative except as stated above  PHYSICAL EXAM: BP (!) 146/89 (BP Location: Left Arm, Patient Position: Sitting)   Pulse 86   Ht 5\' 10"  (1.778 m)   Wt 165 lb (74.8 kg)   SpO2 98%   BMI 23.68 kg/m    Wt Readings from Last 3 Encounters:  07/06/20 165 lb (74.8 kg)  06/29/20 167 lb 6.4 oz (75.9 kg)  05/25/20 169  lb 6.4 oz (76.8 kg)    Physical Exam Constitutional:      Appearance: He is normal weight.  HENT:     Head: Normocephalic and atraumatic.     Right Ear: Tympanic membrane, ear canal and external ear normal.     Left Ear: Tympanic membrane, ear canal and external ear normal.     Nose: Nose normal.     Mouth/Throat:     Mouth: Mucous membranes are moist.     Pharynx: Oropharynx is clear.     Comments: Left -sided facial paralysis; left eyelid with decreased active opening and closure; decreased smile/frown on left side of face. Eyes:     Extraocular Movements: Extraocular movements intact.     Conjunctiva/sclera: Conjunctivae normal.     Pupils: Pupils are equal, round, and reactive to light.  Cardiovascular:     Rate and Rhythm: Normal rate and regular rhythm.     Pulses: Normal pulses.     Heart sounds: Normal heart sounds.  Pulmonary:     Effort: Pulmonary effort is normal.     Breath sounds: Normal breath sounds.  Abdominal:     General: Bowel sounds are normal.     Palpations: Abdomen is soft.  Genitourinary:  Comments: Patient declined examination. Musculoskeletal:        General: Normal range of motion.     Cervical back: Normal range of motion and neck supple.  Skin:    General: Skin is warm and dry.     Capillary Refill: Capillary refill takes less than 2 seconds.  Neurological:     General: No focal deficit present.     Mental Status: He is alert and oriented to person, place, and time.  Psychiatric:        Mood and Affect: Mood normal.        Behavior: Behavior normal.     ASSESSMENT AND PLAN: 1. Annual physical exam: - Counseled on 150 minutes of exercise per week as tolerated, healthy eating (including decreased daily intake of saturated fats, cholesterol, added sugars, sodium), STI prevention, and routine healthcare maintenance.  2. Screening for metabolic disorder: - CMP last obtained 05/17/2020.  3. Screening for deficiency anemia: - CBC last  obtained 05/17/2020.  4. Diabetes mellitus screening: - Hemoglobin A1c to screen for pre-diabetes/diabetes. - Hemoglobin A1c  5. Screening cholesterol level: - Lipid panel to screen for high cholesterol.  - Lipid Panel  6. Thyroid disorder screen: - TSH to check thyroid function.  - TSH  7. Need for hepatitis C screening test: - HCV antibody to screen for hepatitis C.  - HCV Ab w/Rflx to Verification  8. Encounter for screening for HIV: - HIV antibody to screen for human immunodeficiency virus.  - HIV antibody (with reflex)  9. Colon cancer screening: - Declined colon cancer screening.  10. Elevated blood-pressure reading, without diagnosis of hypertension: - Blood pressure not at goal during today's visit. Patient asymptomatic without chest pressure, chest pain, palpitations, shortness of breath, and worst headache of life. - Patient not ready to begin hypertension medication at this time until the plans for Matthew Jennings finalized. - Follow-up with primary provider as scheduled.   11. Left-sided Matthew Jennings: - Keep all appointments with ENT. - Keep all appointments with Neurology.    12. Eye exam, routine: -  Referral to Ophthalmology for further evaluation and management.  - Ambulatory referral to Ophthalmology  13. Financial difficulties: - Patient reports concerns for paying bills since being unemployed. Says he is currently receiving EBT. Also, receiving CAFA from [facility] but finding challenges finding providers who accepts this. -Referral to Social Work for [employer].  - Ambulatory referral to Social Work  Patient was given the opportunity to ask questions.  Patient verbalized understanding of the plan and was able to repeat key elements of the plan. Patient was given clear instructions to go to Emergency Department or return to medical center if symptoms don't improve, worsen, or new problems develop.The patient verbalized understanding.   Orders  Placed This Encounter  Procedures  . Hemoglobin A1c  . TSH  . Lipid Panel  . HCV Ab w/Rflx to Verification  . HIV antibody (with reflex)     Requested Prescriptions    No prescriptions requested or ordered in this encounter    Return for Follow-Up 2 months blood pressure check and as soon as possible with Jenel Lucks, LCSW.  Matthew Fendt, NP

## 2020-07-07 DIAGNOSIS — R030 Elevated blood-pressure reading, without diagnosis of hypertension: Secondary | ICD-10-CM | POA: Insufficient documentation

## 2020-07-07 DIAGNOSIS — G510 Bell's palsy: Secondary | ICD-10-CM | POA: Insufficient documentation

## 2020-07-09 LAB — LIPID PANEL
Chol/HDL Ratio: 3.9 ratio (ref 0.0–5.0)
Cholesterol, Total: 213 mg/dL — ABNORMAL HIGH (ref 100–199)
HDL: 54 mg/dL (ref >39)
LDL Chol Calc (NIH): 147 mg/dL — ABNORMAL HIGH (ref 0–99)
Triglycerides: 67 mg/dL (ref 0–149)
VLDL Cholesterol Cal: 12 mg/dL (ref 5–40)

## 2020-07-09 LAB — HCV INTERPRETATION

## 2020-07-09 LAB — HIV ANTIBODY (ROUTINE TESTING W REFLEX): HIV Screen 4th Generation wRfx: NONREACTIVE

## 2020-07-09 LAB — HEMOGLOBIN A1C
Est. average glucose Bld gHb Est-mCnc: 272 mg/dL
Hgb A1c MFr Bld: 11.1 % — ABNORMAL HIGH (ref 4.8–5.6)

## 2020-07-09 LAB — HCV AB W/RFLX TO VERIFICATION: HCV Ab: <0.1 s/co ratio (ref 0.0–0.9)

## 2020-07-09 LAB — TSH: TSH: 1.72 u[IU]/mL (ref 0.450–4.500)

## 2020-07-10 ENCOUNTER — Other Ambulatory Visit: Payer: Self-pay | Admitting: Family

## 2020-07-10 DIAGNOSIS — E785 Hyperlipidemia, unspecified: Secondary | ICD-10-CM | POA: Insufficient documentation

## 2020-07-10 DIAGNOSIS — E119 Type 2 diabetes mellitus without complications: Secondary | ICD-10-CM | POA: Insufficient documentation

## 2020-07-10 MED ORDER — INSULIN GLARGINE 100 UNIT/ML SOLOSTAR PEN: 10.0000 [IU] | PEN_INJECTOR | Freq: Every day | SUBCUTANEOUS | 0 refills | Status: DC

## 2020-07-10 MED ORDER — TRUE METRIX BLOOD GLUCOSE TEST VI STRP
ORAL_STRIP | 12 refills | Status: DC
  Filled 2020-09-26: qty 100, 25d supply, fill #0

## 2020-07-10 MED ORDER — ATORVASTATIN CALCIUM 20 MG PO TABS
20.0000 mg | ORAL_TABLET | Freq: Every day | ORAL | 0 refills | Status: DC
Start: 2020-07-10 — End: 2020-08-20

## 2020-07-10 MED ORDER — TRUEPLUS LANCETS 28G MISC
4 refills | Status: DC
Start: 2020-07-10 — End: 2020-08-20

## 2020-07-10 MED ORDER — TRUE METRIX METER W/DEVICE KIT: PACK | 0 refills | Status: DC

## 2020-07-10 MED ORDER — PEN NEEDLES 31G X 8 MM MISC: 0 refills | Status: DC

## 2020-07-10 MED FILL — TRUEplus LANCETS 28G MISC: 30 days supply | Qty: 100 | Fill #0

## 2020-07-10 MED FILL — !TRUE METRIX BLOOD GLUCOSE: 1 days supply | Qty: 1 | Fill #0

## 2020-07-10 MED FILL — !LANTUS SOLOSTAR 100UNITS/M: 100 | 28 days supply | Qty: 3 | Fill #0

## 2020-07-10 MED FILL — TRUEPLUS PEN NDL 31GX5/16: 31G X 8 MM | 30 days supply | Qty: 100 | Fill #0

## 2020-07-10 MED FILL — TRUE METRIX GLUCOSE TEST ST: 30 days supply | Qty: 100 | Fill #0

## 2020-07-10 MED FILL — ATORVASTATIN CALCIUM 20 MG: 20 | 30 days supply | Qty: 30 | Fill #0

## 2020-07-10 NOTE — Progress Notes (Signed)
Please call patient with update.   Thyroid normal.   Hepatitis C negative.   HIV negative.   Cholesterol higher than expected. High cholesterol may increase risk of heart attack and/or stroke. Consider eating more fruits, vegetables, and lean baked meats such as chicken or fish. Moderate intensity exercise at least 150 minutes as tolerated per week may help as well.    Atorvastatin prescribed for high cholesterol.   Your hemoglobin A1c is consistent with diabetes. Practice healthy eating habits of fresh fruit and vegetables, lean baked meats such as chicken, fish, and [place]; limit breads, rice, pastas, and desserts; practice regular aerobic exercise (at least 150 minutes a week as tolerated) and will recheck at next visit.   Will begin insulin injections Lantus 10 units every night. Check your blood sugars at least 3 times daily, write these results and bring them to your diabetes checkup in 4 weeks or sooner if needed. Please schedule appointment while patient is on the phone.   Goals for blood sugars are: - A fasting blood sugar of 80 to 130 mg/dL. - After eating 180 mg/dL or less.  - In the event of sugars less than 60 mg/dl or greater than 740 mg/dl please notify the clinic ASAP.   Keep all appointments with primary provider and specialists.

## 2020-07-10 NOTE — Addendum Note (Signed)
Addended by: Rema Fendt on: 07/10/2020 12:24 PM   Modules accepted: Orders

## 2020-07-12 ENCOUNTER — Other Ambulatory Visit: Payer: Self-pay

## 2020-07-12 ENCOUNTER — Ambulatory Visit (INDEPENDENT_AMBULATORY_CARE_PROVIDER_SITE_OTHER): Payer: Self-pay | Admitting: Licensed Clinical Social Worker

## 2020-07-12 DIAGNOSIS — Z599 Problem related to housing and economic circumstances, unspecified: Secondary | ICD-10-CM

## 2020-07-16 NOTE — BH Specialist Note (Signed)
LCSW met with patient. LCSW introduced self and explained role at [facility]. Pt was informed of [facility] referral from PCP to assist with community resources.   Pt shared that he is managing chronic medical conditions that resulted in him being fired from employer November 2021. Since then, pt has been experiencing financial strain. He was recently denied both unemployment benefits, in addition, to [place]. Pt receives limited support from brother occasionally.   LCSW provided support and encouragement. Pt was informed about services provided by Legal Aid. Pt provided consent for LCSW to complete referral to assist with appeals.  A message was sent to [facility] Financial Counselor to assist patient with the [facility] program. No additional concerns noted.

## 2020-07-17 ENCOUNTER — Telehealth: Payer: Self-pay | Admitting: Family

## 2020-07-17 NOTE — Telephone Encounter (Signed)
Spoke w/pt in regards to labs and medication that was sent in for cholesterol and diabetes. Pt stated that he is needle phobic and he will not be able to inject the insulin, he wants a pill instead. Please advise

## 2020-07-17 NOTE — Telephone Encounter (Signed)
Pt received a message that medications where ready for pick up but pt says he isn't on any medications and on his last visit medications were not discussed so pt is asking if PCP wants PT to be taking all the medications on his current list because he didn't know. Patient would like some clarification. Please advise and thank you.

## 2020-07-18 ENCOUNTER — Telehealth: Payer: Self-pay | Admitting: Licensed Clinical Social Worker

## 2020-07-18 ENCOUNTER — Other Ambulatory Visit: Payer: Self-pay | Admitting: Family

## 2020-07-18 MED ORDER — METFORMIN HCL 500 MG PO TABS: 500.0000 mg | ORAL_TABLET | Freq: Two times a day (BID) | ORAL | 0 refills | Status: DC

## 2020-07-18 NOTE — Telephone Encounter (Signed)
Insulin discontinued per patient preference.  Metformin prescribed for diabetes. Patient will need a diabetes checkup in 4 weeks. Please schedule appointment while patient is on the phone.

## 2020-07-18 NOTE — Telephone Encounter (Signed)
Call placed to patient. LCSW informed patient that he has been approved for CAFA at 100%. Financial Counselor will mail letter to address on file. Pt shared that he received two missed calls from Legal Aid. LCSW agreed to e-mail Donavan Burnet, requesting pt is contacted earlier in the day to promote chances of contact. No additional concerns noted.

## 2020-07-18 NOTE — Addendum Note (Signed)
Addended by: Rema Fendt on: 07/18/2020 09:19 PM   Modules accepted: Orders

## 2020-07-19 MED FILL — METFORMIN HCL 500 MG TABS: 500 | 30 days supply | Qty: 60 | Fill #0

## 2020-07-24 MED FILL — !LANTUS SOLOSTAR 100UNITS/M: 100 | 28 days supply | Qty: 3 | Fill #0

## 2020-07-24 NOTE — Telephone Encounter (Signed)
Spoke w/pt will be picking up metformin on 3/18

## 2020-07-27 ENCOUNTER — Ambulatory Visit: Payer: Self-pay | Admitting: Otolaryngology

## 2020-07-27 ENCOUNTER — Telehealth: Payer: Self-pay | Admitting: Licensed Clinical Social Worker

## 2020-07-27 NOTE — Telephone Encounter (Signed)
Follow up call placed to patient. Patient was able to speak with Donavan Burnet with Legal Aid. Ms. Lowell Guitar strongly encouraged to apply for SSI/SSDI due to pt's ineligibility for unemployment benefits (lost job due to medical condition) Pt was provided contact number for Social Security Administration to apply.   Pt reports that he has multiple bills mailed to residence. LCSW directed pt to inform the Financial Department of his 100% CAFA and to inquire about [employer] for that particular bill. Pt verbalized understanding and was appreciative for the assistance.   A copy of CAFA letter was mailed to residence, per pt request.

## 2020-08-10 ENCOUNTER — Other Ambulatory Visit: Payer: Self-pay

## 2020-08-10 ENCOUNTER — Encounter: Payer: Self-pay | Admitting: Otolaryngology

## 2020-08-10 ENCOUNTER — Ambulatory Visit: Payer: Self-pay | Attending: Otolaryngology | Admitting: Otolaryngology

## 2020-08-10 VITALS — BP 151/84 | HR 69 | Wt 175.2 lb

## 2020-08-10 DIAGNOSIS — H6121 Impacted cerumen, right ear: Secondary | ICD-10-CM

## 2020-08-10 DIAGNOSIS — R2689 Other abnormalities of gait and mobility: Secondary | ICD-10-CM | POA: Insufficient documentation

## 2020-08-10 DIAGNOSIS — G510 Bell's palsy: Secondary | ICD-10-CM

## 2020-08-10 DIAGNOSIS — H9192 Unspecified hearing loss, left ear: Secondary | ICD-10-CM | POA: Insufficient documentation

## 2020-08-10 MED ORDER — CARBAMIDE PEROXIDE 6.5 % OT SOLN: 5.0000 [drp] | Freq: Two times a day (BID) | OTIC | 2 refills | Status: AC

## 2020-08-10 NOTE — Progress Notes (Signed)
Chief complaint: Follow-up left facial weakness.  History: 6-week check.  He feels like his face may be slightly better, but still having trouble with chewing and speaking.  The left ear still feels like it is underwater.  Recently, he is having trouble hearing with the right ear as well.  The left arm remains weak and numb.  He has not yet gone back to work.  He is having some episodic near falls.  He feels like he is being "pushed over".  No fainting.  No apparent arrhythmias.  No vertigo.  Examination: He is somewhat pale.  He is thin.  Mental status is sharp.  He does have some reduced motion of the left face still, lower more so than upper.  He now does close his left eye well, passively and actively.  The right ear canal has impacted wax against the drum.  Left ear canal appears clear with an aerated tympanic membrane.  Cranial nerves intact except for VII. finger-to-nose testing eyes open and eyes shut intact.  Impression: Incomplete recovery of left facial weakness with some additional symptoms including falling, decreased hearing on the left, and numbness and weakness of his left arm.  Cerumen impaction, right.

## 2020-08-10 NOTE — Patient Instructions (Addendum)
The left side of your face has not fully recovered.  It has now been 2-1/2 months.  Your arm is still weak and numb.  I definitely think you should see the neurologist, which appointment you have scheduled in 10 weeks.  I think you can stop taping your eyes shut at night.  I do not have a good explanation for the decreased hearing on the left and the alteration in your sense of balance.  I will send you to get a hearing test.  The neurologist may address the balance issues as well as the arm.    I think between the numbness and weakness of your left arm, and the occasional balance problems, you are probably not safe to go back to work yet.  We will use Debrox solution to loosen the wax from the right ear before your hearing test.  Recheck here 1-2 months.  If the face does not recover fully by that time, we will check another MRI scan.

## 2020-08-15 ENCOUNTER — Other Ambulatory Visit: Payer: Self-pay

## 2020-08-15 ENCOUNTER — Ambulatory Visit: Payer: Self-pay | Attending: Family

## 2020-08-15 MED FILL — Glucose Blood Test Strip: 25 days supply | Qty: 100 | Fill #0 | Status: CN

## 2020-08-20 ENCOUNTER — Ambulatory Visit (INDEPENDENT_AMBULATORY_CARE_PROVIDER_SITE_OTHER): Payer: Self-pay | Admitting: Diagnostic Neuroimaging

## 2020-08-20 ENCOUNTER — Other Ambulatory Visit: Payer: Self-pay

## 2020-08-20 ENCOUNTER — Encounter: Payer: Self-pay | Admitting: Diagnostic Neuroimaging

## 2020-08-20 VITALS — BP 138/76 | HR 76 | Ht 70.0 in | Wt 176.0 lb

## 2020-08-20 DIAGNOSIS — G510 Bell's palsy: Secondary | ICD-10-CM

## 2020-08-20 DIAGNOSIS — R202 Paresthesia of skin: Secondary | ICD-10-CM

## 2020-08-20 DIAGNOSIS — R200 Anesthesia of skin: Secondary | ICD-10-CM

## 2020-08-20 DIAGNOSIS — G43109 Migraine with aura, not intractable, without status migrainosus: Secondary | ICD-10-CM

## 2020-08-20 MED ORDER — AMITRIPTYLINE HCL 25 MG PO TABS
25.0000 mg | ORAL_TABLET | Freq: Every day | ORAL | 6 refills | Status: DC
  Filled 2020-08-20 – 2020-08-28 (×2): qty 30, 30d supply, fill #0

## 2020-08-20 MED ORDER — RIZATRIPTAN BENZOATE 10 MG PO TBDP
10.0000 mg | ORAL_TABLET | ORAL | 11 refills | Status: DC | PRN
  Filled 2020-08-20: qty 9, 30d supply, fill #0
  Filled 2020-08-28: qty 9, 5d supply, fill #0

## 2020-08-20 NOTE — Patient Instructions (Signed)
LEFT BELL'S PALSY (improving) - continue supportive care   LEFT HAND NUMBNESS (likely peripheral neuropathy; carpal tunnel syndrome + ulnar neuropathy; in setting of diabetic polyneuropathy) - recently started diabetes treatments; recommend to continue diabetes control and monitor; may consider EMG/NCS in future   MIGRAINE WITH AURA  MIGRAINE PREVENTION  LIFESTYLE CHANGES -Stop or avoid smoking -Decrease or avoid caffeine / alcohol -Eat and sleep on a regular schedule -Exercise several times per week - start amitriptyline 25mg  at bedtime (can also help with nerve pain)  MIGRAINE RESCUE  - ibuprofen, tylenol as needed - start rizatriptan (Maxalt) 10mg  as needed for breakthrough headache; may repeat x 1 after 2 hours; max 2 tabs per day or 8 per month   DIABETES - medical mgmt per PCP

## 2020-08-20 NOTE — Progress Notes (Signed)
 [facility]  PATIENT: Matthew Jennings DOB: 01/21/1966  REFERRING CLINICIAN: Stephens, Amy J, NP HISTORY FROM: patient  REASON FOR VISIT: new consult    HISTORICAL  CHIEF COMPLAINT:  Chief Complaint  Patient presents with  . Left sided Bell's Palsy    Rm 7 New Pt  "Jan 13th is when I developed Bell's palsy"    HISTORY OF PRESENT ILLNESS:   55-year-old male here for evaluation of Bell's palsy.  January 2022 patient had onset of left facial weakness, went to the ER for evaluation.  He was also having left hand numbness at the time and had MRI of the brain which showed enhancement of the left facial nerve.  No cause for left hand numbness was found.  Patient followed up with ENT and Bell's palsy symptoms have improved.  Patient continues to have left hand numbness and weakness, mainly affecting digits 1 and 5.  Patient also has bilateral toe and feet numbness.  Patient has new diagnosis of diabetes, to started medications about 1 month ago.  A1c is greater than 11.  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  Patient has history of migraine since age 30 years old with global intense squeezing throbbing sensation, sensitive to light and sound, triggered by weather changes.  Having 3 headaches per week.  He has tried Aleve and Keppra in the past without relief.    REVIEW OF SYSTEMS: Full 14 system review of systems performed and negative with exception of: as per HPI.   ALLERGIES: Allergies  Allergen Reactions  . Penicillins Rash    HOME MEDICATIONS: Outpatient Medications Prior to Visit  Medication Sig Dispense Refill  . atorvastatin (LIPITOR) 20 MG tablet TAKE 1 TABLET (20 MG TOTAL) BY MOUTH DAILY. 90 tablet 0  . Blood Glucose Monitoring Suppl (TRUE METRIX METER) w/Device KIT Use as directed 1 kit 0  . glucose blood (TRUE METRIX BLOOD GLUCOSE TEST) test strip Use as instructed 100 each  12  . metFORMIN (GLUCOPHAGE) 500 MG tablet Take 1 tablet (500 mg total) by mouth 2 (two) times daily with a meal. 60 tablet 0  . naproxen sodium (ALEVE) 220 MG tablet Take 220 mg by mouth.    . carbamide peroxide (DEBROX) 6.5 % OTIC solution Place 5 drops into the right ear 2 (two) times daily. (Patient not taking: Reported on 08/20/2020) 15 mL 2  . insulin glargine (LANTUS) 100 UNIT/ML Solostar Pen INJECT 10 UNITS INTO THE SKIN DAILY. (Patient not taking: Reported on 08/20/2020) 15 mL 0  . Insulin Pen Needle (PEN NEEDLES) 31G X 8 MM MISC UAD (Patient not taking: Reported on 08/20/2020) 100 each 0  . Insulin Pen Needle 31G X 8 MM MISC USE AS DIRECTED (Patient not taking: Reported on 08/20/2020) 100 each 0  . TRUEplus Lancets 28G MISC USE AS DIRECTED (Patient not taking: Reported on 08/20/2020) 100 each 4  . atorvastatin (LIPITOR) 20 MG tablet Take 1 tablet (20 mg total) by mouth daily. 90 tablet 0  . glucose blood test strip USE AS INSTRUCTED 100 strip 12  . metFORMIN (GLUCOPHAGE) 500 MG tablet TAKE 1 TABLET (500 MG TOTAL) BY MOUTH 2 (TWO) TIMES DAILY WITH A MEAL. 60 tablet 0  . TRUEplus Lancets 28G MISC Use as directed 100 each 4   No facility-administered medications prior to visit.    PAST MEDICAL HISTORY: Past Medical History:  Diagnosis Date  . Bell's palsy      left  . Diabetes mellitus without complication ([place])   . Facial paralysis/Bells palsy   . Hx of migraines     PAST SURGICAL HISTORY: Past Surgical History:  Procedure Laterality Date  . KNEE RECONSTRUCTION Left     FAMILY HISTORY: Family History  Problem Relation Age of Onset  . Cancer Mother   . Alzheimer's disease Father     SOCIAL HISTORY: Social History   Socioeconomic History  . Marital status: Single    Spouse name: Not on file  . Number of children: 0  . Years of education: Not on file  . Highest education level: Associate degree: occupational, Hotel manager, or vocational program  Occupational History     Comment: EMT  Tobacco Use  . Smoking status: Passive Smoke Exposure - Never Smoker  . Smokeless tobacco: Never Used  Substance and Sexual Activity  . Alcohol use: Yes    Comment: occ  . Drug use: Not Currently  . Sexual activity: Not Currently  Other Topics Concern  . Not on file  Social History Narrative   Lives alone   Social Determinants of Health   Financial Resource Strain: Not on file  Food Insecurity: Not on file  Transportation Needs: Not on file  Physical Activity: Not on file  Stress: Not on file  Social Connections: Not on file  Intimate Partner Violence: Not on file     PHYSICAL EXAM  GENERAL EXAM/CONSTITUTIONAL: Vitals:  Vitals:   08/20/20 1113  BP: 138/76  Pulse: 76  Weight: 176 lb (79.8 kg)  Height: 5' 10" (1.778 m)   Body mass index is 25.25 kg/m. Wt Readings from Last 3 Encounters:  08/20/20 176 lb (79.8 kg)  08/10/20 175 lb 3.2 oz (79.5 kg)  07/06/20 165 lb (74.8 kg)    Patient is in no distress; well developed, nourished and groomed; neck is supple  CARDIOVASCULAR:  Examination of carotid arteries is normal; no carotid bruits  Regular rate and rhythm, no murmurs  Examination of peripheral vascular system by observation and palpation is normal  EYES:  Ophthalmoscopic exam of optic discs and posterior segments is normal; no papilledema or hemorrhages No exam data present  MUSCULOSKELETAL:  Gait, strength, tone, movements noted in Neurologic exam below  NEUROLOGIC: MENTAL STATUS:  No flowsheet data found.  awake, alert, oriented to person, place and time  recent and remote memory intact  normal attention and concentration  language fluent, comprehension intact, naming intact  fund of knowledge appropriate  CRANIAL NERVE:   2nd - no papilledema on fundoscopic exam  2nd, 3rd, 4th, 6th - pupils equal and reactive to light, visual fields full to confrontation, extraocular muscles intact, no nystagmus  5th - facial  sensation symmetric  7th - facial strength symmetric; EXCEPT LEFT EYE CLOSURE SLIGHTLY DECR  8th - hearing intact  9th - palate elevates symmetrically, uvula midline  11th - shoulder shrug symmetric  12th - tongue protrusion midline  MOTOR:   normal bulk and tone, full strength in the BUE, BLE; EXCEPT MILD ATROPHY AND WEAKNESS OF LEFT HAND INTRINSIC AND THENAR MUSCLES  SENSORY:   normal and symmetric to light touch, pinprick, temperature, vibration; EXCEPT DECR PP IN LEFT HAND DIGIT 1 AND 5; ABSENT VIB AT TOES  COORDINATION:   finger-nose-finger, fine finger movements normal  REFLEXES:   deep tendon reflexes TRACE and symmetric  GAIT/STATION:   narrow based gait     DIAGNOSTIC DATA (LABS, IMAGING, TESTING) - I reviewed patient records, labs, notes, testing and  imaging myself where available.  Lab Results  Component Value Date   WBC 5.7 05/17/2020   HGB 14.9 05/17/2020   HCT 43.2 05/17/2020   MCV 85.5 05/17/2020   PLT 205 05/17/2020      Component Value Date/Time   NA 137 05/17/2020 1416   K 4.2 05/17/2020 1416   CL 101 05/17/2020 1416   CO2 25 05/17/2020 1416   GLUCOSE 348 (H) 05/17/2020 1416   BUN 17 05/17/2020 1416   CREATININE 0.84 05/17/2020 1416   CALCIUM 9.8 05/17/2020 1416   PROT 8.3 (H) 05/17/2020 1416   ALBUMIN 4.6 05/17/2020 1416   AST 23 05/17/2020 1416   ALT 33 05/17/2020 1416   ALKPHOS 99 05/17/2020 1416   BILITOT 0.6 05/17/2020 1416   GFRNONAA >60 05/17/2020 1416   Lab Results  Component Value Date   CHOL 213 (H) 07/06/2020   HDL 54 07/06/2020   LDLCALC 147 (H) 07/06/2020   TRIG 67 07/06/2020   CHOLHDL 3.9 07/06/2020   Lab Results  Component Value Date   HGBA1C 11.1 (H) 07/06/2020   No results found for: VITAMINB12 Lab Results  Component Value Date   TSH 1.720 07/06/2020    05/17/20 MRI brain / IAC [I reviewed images myself and agree with interpretation. -VRP]  - Prominent asymmetric enhancement of the left seventh cranial  nerve extending from the IAC fundus through the mastoid segment. Findings are compatible with Bell's palsy in the appropriate clinical setting. Should the patient's symptoms persist or follow an unexpected course, follow-up brain MRI with contrast and with IAC protocol recommended. - T2 hyperintensity and enhancement within the left mandibular condyle, nonspecific but possibly degenerative or inflammatory in etiology. Clinical correlation is recommended.    ASSESSMENT AND PLAN  55 y.o. year old male here with:  Dx:  1. Left-sided Bell's palsy   2. Numbness and tingling in left hand   3. Migraine with aura and without status migrainosus, not intractable      PLAN:   LEFT BELL'S PALSY (improving) - continue supportive care   LEFT HAND NUMBNESS (likely peripheral neuropathy; carpal tunnel syndrome + ulnar neuropathy; in setting of diabetic polyneuropathy) - recently started diabetes treatments; recommend to continue diabetes control and monitor; may consider EMG/NCS in future   MIGRAINE WITH AURA  MIGRAINE PREVENTION  LIFESTYLE CHANGES -Stop or avoid smoking -Decrease or avoid caffeine / alcohol -Eat and sleep on a regular schedule -Exercise several times per week - start amitriptyline 25mg at bedtime (can also help with nerve pain)  MIGRAINE RESCUE  - ibuprofen, tylenol as needed - start rizatriptan (Maxalt) 10mg as needed for breakthrough headache; may repeat x 1 after 2 hours; max 2 tabs per day or 8 per month   DIABETES - medical mgmt per PCP   Meds ordered this encounter  Medications  . amitriptyline (ELAVIL) 25 MG tablet    Sig: Take 1 tablet (25 mg total) by mouth at bedtime.    Dispense:  30 tablet    Refill:  6  . rizatriptan (MAXALT-MLT) 10 MG disintegrating tablet    Sig: Take 1 tablet (10 mg total) by mouth as needed for migraine. May repeat in 2 hours if needed    Dispense:  9 tablet    Refill:  11    Return in about 6 months (around  02/19/2021) for with NP (Amy Lomax).    VIKRAM R. PENUMALLI, MD 08/20/2020, 11:52 AM Certified in Neurology, Neurophysiology and Neuroimaging  [facility] [address], [address] [place], [place] [address] [phone]  

## 2020-08-22 ENCOUNTER — Other Ambulatory Visit: Payer: Self-pay

## 2020-08-27 ENCOUNTER — Other Ambulatory Visit: Payer: Self-pay

## 2020-08-28 ENCOUNTER — Other Ambulatory Visit: Payer: Self-pay

## 2020-08-29 ENCOUNTER — Telehealth: Payer: Self-pay | Admitting: Family

## 2020-08-29 NOTE — Telephone Encounter (Signed)
Copied from CRM [id number]. Topic: General - Other >> Aug 28, 2020 10:43 AM Leafy Ro wrote: Reason for CRM: Pt is calling and requesting Matthew Jennings to return his call. Pt had an appt with dr Marjory Lies on 08-20-2020 and per pt doctor told him once he gets proper insurance he would sch nerve conduction test if need be. Pt also mention the doctor said he guess his numbness and weakness in left arm was due to pulled /pinch nerve in left elbow also carpal tunnel in left wrist

## 2020-08-29 NOTE — Telephone Encounter (Signed)
I return Pt call, LVM inform that he is approve for the [facility] Financial program and if he need any specialist, he need to talk to his PCP to get a referral

## 2020-09-01 NOTE — Progress Notes (Signed)
Patient ID: Matthew Jennings, male    DOB: Jun 09, 1965  MRN: [id number]  CC: Diabetes Follow-Up  Subjective: Matthew Jennings is a 55 y.o. male who presents for diabetes follow-up. His concerns today include:   1. DIABETES TYPE 2 FOLLOW-UP: 07/06/2020: - Continue Metformin.  - Begin Lantus 10 units daily.   09/03/2020: Last A1C: 10.7% on 09/03/2020  Med Adherence:  '[]'  Yes    '[x]'  No, reports did not begin taking Lantus because he does not like needles. Wanted to give course of Metformin as monotherapy before considering Lantus. Medication side effects:  '[]'  Yes    '[x]'  No Home Monitoring?  '[x]'  Yes    '[]'  No Home glucose results range: 140's-240's Diet Adherence: '[x]'  Yes    '[]'  No Exercise: '[x]'  Yes    '[]'  No Hypoglycemic episodes?: '[]'  Yes    '[x]'  No Numbness of the feet? '[]'  Yes    '[x]'  No Retinopathy hx? '[]'  Yes    '[x]'  No Last eye exam: Reports he does have list of eye resources but unable to pay the $100 - $300 fee to be seen at this time.  Comments: Reports he is still not ready to try Lantus as of yet because he does not like the thought of sticking himself with needles. Would rather try an additional pill first.   2. BLOOD PRESSURE FOLLOW-UP: 07/06/2020: - Blood pressure not at goal during today's visit. Patient asymptomatic without chest pressure, chest pain, palpitations, shortness of breath, and worst headache of life. - Patient not ready to begin hypertension medication at this time until the plans for Bell's palsy finalized.  09/03/2020: Blood pressure normal at today's visit.  Patient Active Problem List   Diagnosis Date Noted  . Hearing loss of right ear due to cerumen impaction 08/10/2020  . Hearing loss of left ear 08/10/2020  . Balance problem 08/10/2020  . Type 2 diabetes mellitus ([place]) 07/10/2020  . Hyperlipidemia 07/10/2020  . Bell's palsy 07/07/2020  . Elevated blood-pressure reading without diagnosis of hypertension 07/07/2020     Current Outpatient Medications on  File Prior to Visit  Medication Sig Dispense Refill  . amitriptyline (ELAVIL) 25 MG tablet Take 1 tablet (25 mg total) by mouth at bedtime. 30 tablet 6  . atorvastatin (LIPITOR) 20 MG tablet TAKE 1 TABLET (20 MG TOTAL) BY MOUTH DAILY. 90 tablet 0  . Blood Glucose Monitoring Suppl (TRUE METRIX METER) w/Device KIT Use as directed 1 kit 0  . carbamide peroxide (DEBROX) 6.5 % OTIC solution Place 5 drops into the right ear 2 (two) times daily. (Patient not taking: Reported on 08/20/2020) 15 mL 2  . glucose blood (TRUE METRIX BLOOD GLUCOSE TEST) test strip Use as instructed 100 each 12  . insulin glargine (LANTUS) 100 UNIT/ML Solostar Pen INJECT 10 UNITS INTO THE SKIN DAILY. (Patient not taking: Reported on 08/20/2020) 15 mL 0  . Insulin Pen Needle (PEN NEEDLES) 31G X 8 MM MISC UAD (Patient not taking: Reported on 08/20/2020) 100 each 0  . Insulin Pen Needle 31G X 8 MM MISC USE AS DIRECTED (Patient not taking: Reported on 08/20/2020) 100 each 0  . naproxen sodium (ALEVE) 220 MG tablet Take 220 mg by mouth.    . rizatriptan (MAXALT-MLT) 10 MG disintegrating tablet Take 1 tablet (10 mg total) by mouth as needed for migraine. May repeat in 2 hours if needed 9 tablet 11  . TRUEplus Lancets 28G MISC USE AS DIRECTED (Patient not taking: Reported on 08/20/2020) 100 each  4   No current facility-administered medications on file prior to visit.    Allergies  Allergen Reactions  . Penicillins Rash    Social History   Socioeconomic History  . Marital status: Single    Spouse name: Not on file  . Number of children: 0  . Years of education: Not on file  . Highest education level: Associate degree: occupational, Hotel manager, or vocational program  Occupational History    Comment: EMT  Tobacco Use  . Smoking status: Passive Smoke Exposure - Never Smoker  . Smokeless tobacco: Never Used  Substance and Sexual Activity  . Alcohol use: Yes    Comment: occ  . Drug use: Not Currently  . Sexual activity: Not  Currently  Other Topics Concern  . Not on file  Social History Narrative   Lives alone   Social Determinants of Health   Financial Resource Strain: Not on file  Food Insecurity: Not on file  Transportation Needs: Not on file  Physical Activity: Not on file  Stress: Not on file  Social Connections: Not on file  Intimate Partner Violence: Not on file    Family History  Problem Relation Age of Onset  . Cancer Mother   . Alzheimer's disease Father     Past Surgical History:  Procedure Laterality Date  . KNEE RECONSTRUCTION Left     ROS: Review of Systems Negative except as stated above  PHYSICAL EXAM: BP 128/79 (BP Location: Left Arm, Patient Position: Sitting)   Pulse 79   Ht '5\' 10"'  (1.778 m)   Wt 170 lb 3.2 oz (77.2 kg)   SpO2 98%   BMI 24.42 kg/m   Physical Exam HENT:     Head: Normocephalic and atraumatic.  Eyes:     Extraocular Movements: Extraocular movements intact.     Conjunctiva/sclera: Conjunctivae normal.     Pupils: Pupils are equal, round, and reactive to light.  Cardiovascular:     Rate and Rhythm: Normal rate and regular rhythm.     Pulses: Normal pulses.     Heart sounds: Normal heart sounds.  Pulmonary:     Effort: Pulmonary effort is normal.     Breath sounds: Normal breath sounds.  Musculoskeletal:     Cervical back: Normal range of motion and neck supple.  Neurological:     General: No focal deficit present.     Mental Status: He is alert and oriented to person, place, and time.  Psychiatric:        Mood and Affect: Mood normal.        Behavior: Behavior normal.    ASSESSMENT AND PLAN: 1. Type 2 diabetes mellitus without complication, without long-term current use of insulin ([place]): - Hemoglobin A1c not at goal today at 10.7%, goal < 7%. This is similar to previous hemoglobin A1c of 11.1% on 07/06/2020. Next hemoglobin A1c due July 2022.  - Patient not ready to try course of Lantus as of yet. He does not like needles.  - Increase  Metformin from 500 mg twice daily to 1000 mg twice daily.  - Begin Dapagliflozin as prescribed.  - To achieve an A1C goal of less than or equal to 7.0 percent, a fasting blood sugar of 80 to 130 mg/dL and a postprandial glucose (90 to 120 minutes after a meal) less than 180 mg/dL. In the event of sugars less than 60 mg/dl or greater than 400 mg/dl please notify the clinic ASAP. It is recommended that you undergo annual eye exams and  annual foot exams. - Discussed the importance of healthy eating habits, low-carbohydrate diet, low-sugar diet, regular aerobic exercise (at least 150 minutes a week as tolerated) and medication compliance to achieve or maintain control of diabetes. - BMP to evaluate kidney function and electrolyte balance. -Microalbumin/creatinine ratio to check kidney function.  -Follow-up with clinical pharmacist in 4 weeks for diabetes checkup. Write your home blood sugar results down each day and bring those results to your appointment along with your home glucose monitor. Medications may be revised at that time if needed. - POCT glycosylated hemoglobin (Hb U4R) - Basic Metabolic Panel - Microalbumin/Creatinine Ratio, Urine - metFORMIN (GLUCOPHAGE) 1000 MG tablet; Take 1 tablet (1,000 mg total) by mouth 2 (two) times daily with a meal.  Dispense: 180 tablet; Refill: 0 - dapagliflozin propanediol (FARXIGA) 5 MG TABS tablet; Take 1 tablet (5 mg total) by mouth daily before breakfast.  Dispense: 30 tablet; Refill: 0   Patient was given the opportunity to ask questions.  Patient verbalized understanding of the plan and was able to repeat key elements of the plan. Patient was given clear instructions to go to Emergency Department or return to medical center if symptoms don't improve, worsen, or new problems develop.The patient verbalized understanding.   Orders Placed This Encounter  Procedures  . Basic Metabolic Panel  . Microalbumin/Creatinine Ratio, Urine  . POCT glycosylated  hemoglobin (Hb A1C)     Requested Prescriptions   Signed Prescriptions Disp Refills  . metFORMIN (GLUCOPHAGE) 1000 MG tablet 180 tablet 0    Sig: Take 1 tablet (1,000 mg total) by mouth 2 (two) times daily with a meal.  . dapagliflozin propanediol (FARXIGA) 5 MG TABS tablet 30 tablet 0    Sig: Take 1 tablet (5 mg total) by mouth daily before breakfast.    Return in about 4 weeks (around 10/01/2020) for Follow-Up Lurena Joiner at [facility] and [facility].  Camillia Herter, NP

## 2020-09-03 ENCOUNTER — Other Ambulatory Visit: Payer: Self-pay

## 2020-09-03 ENCOUNTER — Encounter: Payer: Self-pay | Admitting: Family

## 2020-09-03 ENCOUNTER — Ambulatory Visit (INDEPENDENT_AMBULATORY_CARE_PROVIDER_SITE_OTHER): Payer: Self-pay | Admitting: Family

## 2020-09-03 VITALS — BP 128/79 | HR 79 | Ht 70.0 in | Wt 170.2 lb

## 2020-09-03 DIAGNOSIS — E119 Type 2 diabetes mellitus without complications: Secondary | ICD-10-CM

## 2020-09-03 LAB — POCT GLYCOSYLATED HEMOGLOBIN (HGB A1C): Hemoglobin A1C: 10.7 % — AB (ref 4.0–5.6)

## 2020-09-03 MED ORDER — DAPAGLIFLOZIN PROPANEDIOL 5 MG PO TABS
5.0000 mg | ORAL_TABLET | Freq: Every day | ORAL | 0 refills | Status: DC
  Filled 2020-09-03: qty 30, 30d supply, fill #0

## 2020-09-03 MED ORDER — METFORMIN HCL 1000 MG PO TABS
1000.0000 mg | ORAL_TABLET | Freq: Two times a day (BID) | ORAL | 0 refills | Status: DC
Start: 2020-09-03 — End: 2020-11-29
  Filled 2020-09-03: qty 60, 30d supply, fill #0
  Filled 2020-11-01: qty 60, 30d supply, fill #1

## 2020-09-03 NOTE — Progress Notes (Signed)
diabetes f/u

## 2020-09-03 NOTE — Patient Instructions (Addendum)
Increase Metformin.   Begin Matthew Jennings.  Follow-up in 4 weeks for diabetes meter check with Franky Macho located at [facility] [address] [place], [address] Phone: [phone] Fax: [phone]  Type 2 Diabetes Mellitus, Diagnosis, Adult Type 2 diabetes (type 2 diabetes mellitus) is a long-term disease. It may happen when there is one or both of these problems:  The pancreas does not make enough insulin.  The body does not react in a normal way to insulin that it makes. Insulin lets sugars go into cells in your body. If you have type 2 diabetes, sugars cannot get into your cells. Sugars build up in the blood. This causes high blood sugar. What are the causes? The exact cause of this condition is not known. What increases the risk? The following factors may make you more likely to develop this condition:  Having type 2 diabetes in your family.  Being overweight or very overweight.  Not being active.  Your body not reacting in a normal way to the insulin it makes.  Having higher than normal blood sugar over time.  Having a type of diabetes when you were pregnant.  Having a condition that causes small fluid-filled sacs on your ovaries. What are the signs or symptoms? At first, you may have no symptoms. You will get symptoms slowly. They may include:  More thirst than normal.  More hunger than normal.  Needing to pee more than normal.  Losing weight without trying.  Feeling tired.  Feeling weak.  Seeing things blurry.  Dark patches on your skin. How is this treated? This condition may be treated by a diabetes expert. You may need to:  Follow an eating plan made by a food expert (dietitian).  Get regular exercise.  Find ways to deal with stress.  Check blood sugar as often as told.  Take medicines. Your doctor will set treatment goals for you. Your blood sugar should be at these levels:  Before meals: 80-130  mg/dL (4.4-8.1 mmol/L).  After meals: below 180 mg/dL (10 mmol/L).  Over the last 2-3 months: less than 7%. Follow these instructions at home: Medicines  Take your diabetes medicines or insulin every day.  Take medicines to help you not get other problems caused by this condition. You may need: ? Aspirin. ? Medicine to lower cholesterol. ? Medicine to control blood pressure. Questions to ask your doctor  Should I meet with a diabetes educator?  What medicines do I need, and when should I take them?  What will I need to treat my condition at home?  When should I check my blood sugar?  Where can I find a support group?  Who can I call if I have questions?  When is my next doctor visit? General instructions  Take over-the-counter and prescription medicines only as told by your doctor.  Keep all follow-up visits as told by your doctor. This is important. Where to find more information  American Diabetes Association (ADA): www.diabetes.org  American Association of Diabetes Care and Education Specialists (ADCES): www.diabeteseducator.org  International Diabetes Federation (IDF): DCOnly.dk Contact a doctor if:  Your blood sugar is at or above 240 mg/dL (85.6 mmol/L) for 2 days in a row.  You have been sick for 2 days or more, and you are not getting better.  You have had a fever for 2 days or more, and you are not getting better.  You have any of these problems for more than  6 hours: ? You cannot eat or drink. ? You feel like you may vomit. ? You vomit. ? You have watery poop (diarrhea). Get help right away if:  Your blood sugar is very low. This means it is lower than 54 mg/dL (3 mmol/L).  You feel mixed up (confused).  You have trouble thinking clearly.  You have trouble breathing.  You have medium or large ketone levels in your pee. These symptoms may be an emergency. Do not wait to see if the symptoms will go away. Get medical help right away. Call your  local emergency services (911 in the [place] Do not drive yourself to the hospital. Summary  Type 2 diabetes is a long-term disease. Your pancreas may not make enough insulin, or your body may not react in a normal way to insulin that it makes.  This condition is treated with an eating plan, lifestyle changes, and medicines.  Your doctor will set treatment goals for you. These will help you keep your blood sugar in a healthy range.  Keep all follow-up visits as told by your doctor. This is important. This information is not intended to replace advice given to you by your health care provider. Make sure you discuss any questions you have with your health care provider. Document Revised: 11/23/2019 Document Reviewed: 11/23/2019 Elsevier Patient Education  2021 [employer].

## 2020-09-04 LAB — BASIC METABOLIC PANEL
BUN/Creatinine Ratio: 23 — ABNORMAL HIGH (ref 9–20)
BUN: 22 mg/dL (ref 6–24)
CO2: 24 mmol/L (ref 20–29)
Calcium: 9.5 mg/dL (ref 8.7–10.2)
Chloride: 102 mmol/L (ref 96–106)
Creatinine, Ser: 0.94 mg/dL (ref 0.76–1.27)
Glucose: 234 mg/dL — ABNORMAL HIGH (ref 65–99)
Potassium: 4.2 mmol/L (ref 3.5–5.2)
Sodium: 141 mmol/L (ref 134–144)
eGFR: 96 mL/min/{1.73_m2} (ref >59)

## 2020-09-04 LAB — MICROALBUMIN / CREATININE URINE RATIO
Creatinine, Urine: 154.7 mg/dL
Microalb/Creat Ratio: 37 mg/g creat — ABNORMAL HIGH (ref 0–29)
Microalbumin, Urine: 58 ug/mL

## 2020-09-04 NOTE — Progress Notes (Signed)
You have protein spilling over into the urine. This could mean that diabetes is affecting the kidneys. Good diabetes control may help.   Kidney function normal.   Diabetes discussed in office.

## 2020-09-24 ENCOUNTER — Ambulatory Visit: Payer: Self-pay | Attending: Otolaryngology | Admitting: Audiology

## 2020-09-24 ENCOUNTER — Other Ambulatory Visit: Payer: Self-pay

## 2020-09-24 DIAGNOSIS — H6121 Impacted cerumen, right ear: Secondary | ICD-10-CM | POA: Insufficient documentation

## 2020-09-24 NOTE — Progress Notes (Deleted)
Patient ID: Matthew Jennings, male    DOB: 06/04/1965  MRN: [id number]  CC: Impacted Ear Wax  Subjective: Matthew Jennings is a 55 y.o. male who presents for  His concerns today include:  1. Impacted Ear Wax: Duration: {Blank single:19197::"days","weeks","months"} Involved ear(s): {Blank single:19197::"yes","no"} {Blank single:19197::""right","left","bilateral"} Sensation of feeling clogged/plugged: {Blank single:19197::"yes","no"} Decreased/muffled hearing:{Blank single:19197::"yes","no"} Ear pain: {Blank single:19197::"yes","no"} Fever: {Blank single:19197::"yes","no"} Otorrhea: {Blank single:19197::"yes","no"} Hearing loss: {Blank single:19197::"yes","no"} Upper respiratory infection symptoms: {Blank single:19197::"yes","no"} Using Q-Tips: {Blank single:19197::"yes","no"} Status: {Blank multiple:19196::"better","worse","stable","fluctuating"} History of cerumenosis: {Blank single:19197::"yes","no"} Treatments attempted: {Blank single:19197::"none","pseudoephedrine"}   Patient Active Problem List   Diagnosis Date Noted  . Hearing loss of right ear due to cerumen impaction 08/10/2020  . Hearing loss of left ear 08/10/2020  . Balance problem 08/10/2020  . Type 2 diabetes mellitus ([place]) 07/10/2020  . Hyperlipidemia 07/10/2020  . Bell's palsy 07/07/2020  . Elevated blood-pressure reading without diagnosis of hypertension 07/07/2020     Current Outpatient Medications on File Prior to Visit  Medication Sig Dispense Refill  . amitriptyline (ELAVIL) 25 MG tablet Take 1 tablet (25 mg total) by mouth at bedtime. 30 tablet 6  . atorvastatin (LIPITOR) 20 MG tablet TAKE 1 TABLET (20 MG TOTAL) BY MOUTH DAILY. 90 tablet 0  . Blood Glucose Monitoring Suppl (TRUE METRIX METER) w/Device KIT Use as directed 1 kit 0  . carbamide peroxide (DEBROX) 6.5 % OTIC solution Place 5 drops into the right ear 2 (two) times daily. (Patient not taking: Reported on 08/20/2020) 15 mL 2  . dapagliflozin  propanediol (FARXIGA) 5 MG TABS tablet Take 1 tablet (5 mg total) by mouth daily before breakfast. 30 tablet 0  . glucose blood (TRUE METRIX BLOOD GLUCOSE TEST) test strip Use as instructed 100 each 12  . insulin glargine (LANTUS) 100 UNIT/ML Solostar Pen INJECT 10 UNITS INTO THE SKIN DAILY. (Patient not taking: Reported on 08/20/2020) 15 mL 0  . Insulin Pen Needle (PEN NEEDLES) 31G X 8 MM MISC UAD (Patient not taking: Reported on 08/20/2020) 100 each 0  . Insulin Pen Needle 31G X 8 MM MISC USE AS DIRECTED (Patient not taking: Reported on 08/20/2020) 100 each 0  . metFORMIN (GLUCOPHAGE) 1000 MG tablet Take 1 tablet (1,000 mg total) by mouth 2 (two) times daily with a meal. 180 tablet 0  . naproxen sodium (ALEVE) 220 MG tablet Take 220 mg by mouth.    . rizatriptan (MAXALT-MLT) 10 MG disintegrating tablet Take 1 tablet (10 mg total) by mouth as needed for migraine. May repeat in 2 hours if needed 9 tablet 11  . TRUEplus Lancets 28G MISC USE AS DIRECTED (Patient not taking: Reported on 08/20/2020) 100 each 4   No current facility-administered medications on file prior to visit.    Allergies  Allergen Reactions  . Penicillins Rash    Social History   Socioeconomic History  . Marital status: Single    Spouse name: Not on file  . Number of children: 0  . Years of education: Not on file  . Highest education level: Associate degree: occupational, Hotel manager, or vocational program  Occupational History    Comment: EMT  Tobacco Use  . Smoking status: Passive Smoke Exposure - Never Smoker  . Smokeless tobacco: Never Used  Substance and Sexual Activity  . Alcohol use: Yes    Comment: occ  . Drug use: Not Currently  . Sexual activity: Not Currently  Other Topics Concern  . Not on file  Social History Narrative   Lives  alone   Social Determinants of Health   Financial Resource Strain: Not on file  Food Insecurity: Not on file  Transportation Needs: Not on file  Physical Activity: Not on  file  Stress: Not on file  Social Connections: Not on file  Intimate Partner Violence: Not on file    Family History  Problem Relation Age of Onset  . Cancer Mother   . Alzheimer's disease Father     Past Surgical History:  Procedure Laterality Date  . KNEE RECONSTRUCTION Left     ROS: Review of Systems Negative except as stated above  PHYSICAL EXAM: There were no vitals taken for this visit.  Physical Exam  {male adult master:310786} {male adult master:310785}  CMP Latest Ref Rng & Units 09/03/2020 05/17/2020  Glucose 65 - 99 mg/dL 234(H) 348(H)  BUN 6 - 24 mg/dL 22 17  Creatinine 0.76 - 1.27 mg/dL 0.94 0.84  Sodium 134 - 144 mmol/L 141 137  Potassium 3.5 - 5.2 mmol/L 4.2 4.2  Chloride 96 - 106 mmol/L 102 101  CO2 20 - 29 mmol/L 24 25  Calcium 8.7 - 10.2 mg/dL 9.5 9.8  Total Protein 6.5 - 8.1 g/dL - 8.3(H)  Total Bilirubin 0.3 - 1.2 mg/dL - 0.6  Alkaline Phos 38 - 126 U/L - 99  AST 15 - 41 U/L - 23  ALT 0 - 44 U/L - 33   Lipid Panel     Component Value Date/Time   CHOL 213 (H) 07/06/2020 1013   TRIG 67 07/06/2020 1013   HDL 54 07/06/2020 1013   CHOLHDL 3.9 07/06/2020 1013   LDLCALC 147 (H) 07/06/2020 1013    CBC    Component Value Date/Time   WBC 5.7 05/17/2020 1416   RBC 5.05 05/17/2020 1416   HGB 14.9 05/17/2020 1416   HCT 43.2 05/17/2020 1416   PLT 205 05/17/2020 1416   MCV 85.5 05/17/2020 1416   MCH 29.5 05/17/2020 1416   MCHC 34.5 05/17/2020 1416   RDW 12.5 05/17/2020 1416   LYMPHSABS 1.8 05/17/2020 1416   MONOABS 0.5 05/17/2020 1416   EOSABS 0.1 05/17/2020 1416   BASOSABS 0.0 05/17/2020 1416    ASSESSMENT AND PLAN:  There are no diagnoses linked to this encounter.   Patient was given the opportunity to ask questions.  Patient verbalized understanding of the plan and was able to repeat key elements of the plan. Patient was given clear instructions to go to Emergency Department or return to medical center if symptoms don't improve,  worsen, or new problems develop.The patient verbalized understanding.   No orders of the defined types were placed in this encounter.    Requested Prescriptions    No prescriptions requested or ordered in this encounter    No follow-ups on file.  Camillia Herter, NP

## 2020-09-24 NOTE — Procedures (Signed)
  Outpatient Audiology and [facility] [address] [place], [place]  [address] [phone]  AUDIOLOGICAL  EVALUATION  NAME: Matthew Jennings     DOB:   Dec 11, 1965      MRN: [id number]                                                                                     DATE: 09/24/2020     REFERENT: Rema Fendt, NP STATUS: Outpatient DIAGNOSIS: Cerumen impaction   History: Karlis was seen for an audiological evaluation due to a history of left Bell's Palsy. Breccan reports decreased hearing in the right ear occurring for many years. He denies concerns regarding hearing sensitivity in the left ear. Trice reports right tinnitus with the onset occurring 2 months ago and right aural fullness. He denies otalgia. Uriel reprots episodes of dizziness and feeling off-balance when he stands up. Valeria reports using debrox for cerumen management in the right ear.   Evaluation:   Otoscopy showed a clear view of the tympanic membrane in the left ear and occluding cerumen in the right ear.   Tympanometry results were consistent in the left ear with normal middle ear pressure and normal tympanic membrane mobility and in the right ear with no tympanic membrane mobility and middle ear dysfunction likely due to cerumen impaction.   Further audiological testing was not completed due to the cerumen.   Results:  The test and recommendations were reviewed with Beverely Pace. A complete audiological evaluation was not completed at today's evaluation due to impacted cerumen in the right ear. It is recommended for Te[place]um to undergo cerumen removal and management prior to an audiological evaluation.   Recommendations: 1. Referral to an Ear, Nose, and Throat Physician for cerumen removal.  2. Repeat Audiological Evaluation after cerumen management.      Marton Redwood Audiologist, Au.D., CCC-A 09/24/2020  2:56 PM  Cc: Rema Fendt, NP

## 2020-09-25 ENCOUNTER — Ambulatory Visit: Payer: Self-pay | Admitting: Family

## 2020-09-25 ENCOUNTER — Ambulatory Visit: Payer: Self-pay

## 2020-09-26 ENCOUNTER — Other Ambulatory Visit: Payer: Self-pay

## 2020-09-26 ENCOUNTER — Other Ambulatory Visit: Payer: Self-pay | Admitting: Family

## 2020-09-26 DIAGNOSIS — E119 Type 2 diabetes mellitus without complications: Secondary | ICD-10-CM

## 2020-09-26 NOTE — Telephone Encounter (Signed)
Medication Refill - Medication: glucose blood (TRUE METRIX BLOOD GLUCOSE TEST) test strip    Preferred Pharmacy (with phone number or street name):  [facility] Phone:  [phone]  Fax:  [phone]       Agent: Please be advised that RX refills may take up to 3 business days. We ask that you follow-up with your pharmacy.

## 2020-10-01 ENCOUNTER — Ambulatory Visit: Payer: Self-pay | Attending: Family Medicine | Admitting: Pharmacist

## 2020-10-01 ENCOUNTER — Other Ambulatory Visit: Payer: Self-pay

## 2020-10-01 DIAGNOSIS — E119 Type 2 diabetes mellitus without complications: Secondary | ICD-10-CM

## 2020-10-01 MED ORDER — TRULICITY 0.75 MG/0.5ML ~~LOC~~ SOAJ
0.7500 mg | SUBCUTANEOUS | 0 refills | Status: DC
  Filled 2020-10-01: qty 2, 28d supply, fill #0

## 2020-10-01 NOTE — Progress Notes (Signed)
    S:    PCP: Ricky Stabs   No chief complaint on file.  Patient arrives in good spirits.  Presents for diabetes evaluation, education, and management Patient was referred and last seen by Primary Care Provider on 09/03/2020. A1c at that visit was 10.7%. Amy added Marcelline Deist to his metformin. Pt reported that he was not taking Lantus at that visit.  Patient reports Diabetes is longstanding. Denies hx of pancreatitis. Denies personal or fhx of thyroid cancer. No ACS/CAD, CHF, CKD, or stroke hx.   Family/Social History:  -FHx: no pertinent positives  -Tobacco: never smoker  -Alcohol: occasional use   Insurance coverage/medication affordability: self pay  Medication adherence reported with metformin and [place].   Current diabetes medications include: metformin 1000mg  BID, Farxiga 5 mg daily, Lantus 10u daily (not taking).  Patient denies hypoglycemic events.  Patient reported dietary habits:  - Has switched to wheat bread - Continues to consume regular pasta - Has given up sweets, sugar-sweetened beverages; drinks mostly water   Patient-reported exercise habits:  - Mostly through yard work    Patient denies nocturia (nighttime urination).  Patient reports neuropathy (nerve pain). Patient reports visual changes. Patient reports self foot exams.     O:  Lab Results  Component Value Date   HGBA1C 10.7 (A) 09/03/2020   There were no vitals filed for this visit.  Lipid Panel     Component Value Date/Time   CHOL 213 (H) 07/06/2020 1013   TRIG 67 07/06/2020 1013   HDL 54 07/06/2020 1013   CHOLHDL 3.9 07/06/2020 1013   LDLCALC 147 (H) 07/06/2020 1013    Home fasting blood sugars: 144 - 357  2 hour post-meal/random blood sugars: 167 - 311.   Clinical Atherosclerotic Cardiovascular Disease (ASCVD): No  The 10-year ASCVD risk score 07/08/2020 DC Jr., et al., 2013) is: 10.6%   Values used to calculate the score:     Age: 55 years     Sex: Male     Is Non-Hispanic African  American: No     Diabetic: Yes     Tobacco smoker: No     Systolic Blood Pressure: 128 mmHg     Is BP treated: No     HDL Cholesterol: 54 mg/dL     Total Cholesterol: 213 mg/dL    A/P: Diabetes longstanding currently uncontrolled. Patient is able to verbalize appropriate hypoglycemia management plan. Medication adherence appears okay but does not wish to take insulin. We discussed risk vs benefit of a medication such as Trulicity and pt is amenable. -Started Trulicity 0.75 mg weekly.  -Continue metformin and Farxiga at current doses.  -Extensively discussed pathophysiology of diabetes, recommended lifestyle interventions, dietary effects on blood sugar control -Counseled on s/sx of and management of hypoglycemia -Next A1C anticipated 7/22.   Written patient instructions provided.  Total time in face to face counseling 30 minutes.   Follow up Pharmacist Clinic Visit in 1 month.   8/22, PharmD, Butch Penny, CPP Clinical Pharmacist [facility] [phone]

## 2020-10-09 ENCOUNTER — Telehealth: Payer: Self-pay | Admitting: Family

## 2020-10-09 NOTE — Telephone Encounter (Signed)
Pt called in asking about Referral to an Ear, Nose, and Throat Physician for cerumen removal.

## 2020-10-10 NOTE — Telephone Encounter (Signed)
Patient scheduled with Flo Shanks, MD on 12/14/2020 at 11:00 am. If possible let's see if we can call over to [facility] to get him a sooner appointment.

## 2020-10-11 ENCOUNTER — Telehealth: Payer: Self-pay | Admitting: Family

## 2020-10-11 NOTE — Telephone Encounter (Signed)
Called pt to notify him of the upcoming appointment. LVM informing pt of appointment and to call back if he has any questions.

## 2020-10-11 NOTE — Telephone Encounter (Signed)
Copied from CRM [id number]. Topic: General - Other >> Oct 09, 2020 10:21 AM Gaetana Michaelis A wrote: Reason for CRM: Patient would like to be contacted regarding scheduling for their financial counseling   Patient is interested in reapplying for another 6 months of assistance  Please contact to advise further when possible   Called patient and LVM advising I was calling from [facility] in regards to financial correspondence. I let patient know he has active coverage until the end of July and will not be eligible to re-apply until after his current coverage lapses. He will need to give [place] a call Monday July 25th to schedule an appointment for renewal. Patient will have to re-fill out the application and re-provide all necessary documents. Advised patient to call with any further questions or concerns.

## 2020-10-19 ENCOUNTER — Ambulatory Visit: Payer: Self-pay | Admitting: Otolaryngology

## 2020-11-01 ENCOUNTER — Other Ambulatory Visit: Payer: Self-pay

## 2020-11-01 ENCOUNTER — Encounter: Payer: Self-pay | Admitting: Pharmacist

## 2020-11-01 ENCOUNTER — Ambulatory Visit: Payer: Self-pay | Attending: Family | Admitting: Pharmacist

## 2020-11-01 DIAGNOSIS — E785 Hyperlipidemia, unspecified: Secondary | ICD-10-CM

## 2020-11-01 DIAGNOSIS — E119 Type 2 diabetes mellitus without complications: Secondary | ICD-10-CM

## 2020-11-01 LAB — GLUCOSE, POCT (MANUAL RESULT ENTRY): POC Glucose: 179 mg/dl — AB (ref 70–99)

## 2020-11-01 MED ORDER — TRULICITY 1.5 MG/0.5ML ~~LOC~~ SOAJ
1.5000 mg | SUBCUTANEOUS | 2 refills | Status: DC
  Filled 2020-11-01: qty 2, 28d supply, fill #0
  Filled 2020-11-29: qty 2, 28d supply, fill #1
  Filled 2020-12-28: qty 2, 28d supply, fill #2

## 2020-11-01 MED ORDER — ATORVASTATIN CALCIUM 40 MG PO TABS
40.0000 mg | ORAL_TABLET | Freq: Every day | ORAL | 2 refills | Status: DC
  Filled 2020-11-01: qty 30, 30d supply, fill #0
  Filled 2020-12-07: qty 30, 30d supply, fill #1
  Filled 2021-01-24: qty 30, 30d supply, fill #2

## 2020-11-01 MED ORDER — DAPAGLIFLOZIN PROPANEDIOL 5 MG PO TABS
5.0000 mg | ORAL_TABLET | Freq: Every day | ORAL | 2 refills | Status: DC
  Filled 2020-11-01: qty 30, 30d supply, fill #0
  Filled 2020-11-29: qty 30, 30d supply, fill #1
  Filled 2021-01-24: qty 30, 30d supply, fill #2

## 2020-11-01 NOTE — Patient Instructions (Signed)
Thank you for coming to see me today. Please do the following:  Increase your Trulicity to 1.5 mg once a week. I have sent this to your pharmacy.  Try to take metformin twice a day regularly.  Continue taking Marcelline Deist once a day.  Stop the atorvastatin 20 mg and start taking the atorvastatin 40 mg. You will take 1 tablet once a day.   Continue checking blood sugars at home.  Continue making the lifestyle changes we've discussed together during our visit. Diet and exercise play a significant role in improving your blood sugars.  Follow-up with me in 1 month for an A1c check.    Hypoglycemia or low blood sugar:   Low blood sugar can happen quickly and may become an emergency if not treated right away.   While this shouldn't happen often, it can be brought upon if you skip a meal or do not eat enough. Also, if your insulin or other diabetes medications are dosed too high, this can cause your blood sugar to go to low.   Warning signs of low blood sugar include: Feeling shaky or dizzy Feeling weak or tired  Excessive hunger Feeling anxious or upset  Sweating even when you aren't exercising  What to do if I experience low blood sugar? Check your blood sugar with your meter. If lower than 70, proceed to step 2.  Treat with 3-4 glucose tablets or 3 packets of regular sugar. If these aren't around, you can try hard candy. Yet another option would be to drink 4 ounces of fruit juice or 6 ounces of REGULAR soda.  Re-check your sugar in 15 minutes. If it is still below 70, do what you did in step 2 again. If has come back up, go ahead and eat a snack or small meal at this time.

## 2020-11-01 NOTE — Progress Notes (Signed)
S:    PCP: Rema Fendt, NP No chief complaint on file.  Patient arrives in good spirits on 11/01/20. Presents for diabetes evaluation, education, and management. Patient was referred and last seen by Primary Care Provider on 09/03/20. Patient was last seen by pharmacy on 10/01/20. At visit with PCP, patient's A1c was 10.7 and patient reported non-adherence to Lantus 10 units. Farxiga (Dapagliflozin) 5mg  was added to patient's metformin. At visit with CPP on 10/01/20, Trulicity 0.75mg  was initiated d/t above goal home fasting blood sugars (144-357) and post-prandial sugars (167-311).   Patient's diabetes is longstanding. Today he reports doing well. Patient reports adherence to medications. Denies side effects, however, he typically takes one dose of metformin and will only take second dose of metformin if his sugar is too high. Patient does state since starting trulicity that he has only had 3-4 BG readings >200 right beefore bed and one <70, which he attributes to being out in the heat for too long.   Patient reports FBG readings to be between 160-175. He also takes his CBG right before eating and will see readings around 190. Patient does endorse a healthy diet. He eats mainly chicken, salmon, wheat, fruits and vegetables, gatorade zero, and occasional fruit juice. Patient gets most of exercise via yard work and walking to and from his mailbox every day.    Family/Social History:  FH: no pertinent positives Tobacco: never smoker Alcohol: occasional use   Insurance coverage/medication affordability: self-pay  Current diabetes medications include: metformin 1000mg  BID, Farxiga 5mg  daily, Trulicity 0.75mg  once weekly Current hyperlipidemia medications include: Atorvastatin 20mg  once daily    Patient denies nocturia.  Patient reports neuropathy (nerve pain). Patient reports visual changes. Patient reports self foot exams.      O:   PCOT Glucose: 179  Lab Results  Component Value  Date   HGBA1C 10.7 (A) 09/03/2020   There were no vitals filed for this visit.  Lipid Panel     Component Value Date/Time   CHOL 213 (H) 07/06/2020 1013   TRIG 67 07/06/2020 1013   HDL 54 07/06/2020 1013   CHOLHDL 3.9 07/06/2020 1013   LDLCALC 147 (H) 07/06/2020 1013    Clinical Atherosclerotic Cardiovascular Disease (ASCVD): No  The 10-year ASCVD risk score 07/08/2020 DC Jr., et al., 2013) is: 10.6%   Values used to calculate the score:     Age: 55 years     Sex: Male     Is Non-Hispanic African American: No     Diabetic: Yes     Tobacco smoker: No     Systolic Blood Pressure: 128 mmHg     Is BP treated: No     HDL Cholesterol: 54 mg/dL     Total Cholesterol: 213 mg/dL    A/P: Diabetes longstanding currently uncontrolled. Patient is able to verbalize appropriate hypoglycemia management plan. Medication adherence appears optimal. Control is suboptimal due to only taking one dose of metformin 1000mg . Emphasized importance of adherence with patient and he verbalized agreement. Discussed with patient on timing of taking sugar levels throughout the day. Congratulated patient on lifestyle changes being made. Discussed alternative milk options with patient such as oat milk and he was open to trying it.  -Increased dose of Trulicity to 1.5mg  once weekly.  -Continue Metformin 1000mg  BID (instructed to take as prescribed) -Farxiga 5mg  once daily  -Extensively discussed pathophysiology of diabetes, recommended lifestyle interventions, dietary effects on blood sugar control -Counseled on s/sx of and management of hypoglycemia -  Next A1C anticipated 7/22.   ASCVD risk - primary prevention in patient with diabetes. Last LDL is not controlled. ASCVD risk score is not >20%. Patient's LDL on 07/06/20 was 147. Will increase Atorvastatin 20mg  to 40mg  to get patient's LDL levels at goal.  -Adjusted dose of Atorvastatin 40 mg.  -Lipid  Written patient instructions provided.  Total time in face to face  counseling 30 minutes.   Follow up with CPP Clinic Visit in 1 month.     Patient seen with:  PharmD/MBA Candidate [employer] 2023.   [facility], PharmD, 2024, CPP Clinical Pharmacist [facility] [phone]

## 2020-11-02 LAB — LIPID PANEL
Chol/HDL Ratio: 3.8 ratio (ref 0.0–5.0)
Cholesterol, Total: 201 mg/dL — ABNORMAL HIGH (ref 100–199)
HDL: 53 mg/dL (ref >39)
LDL Chol Calc (NIH): 135 mg/dL — ABNORMAL HIGH (ref 0–99)
Triglycerides: 70 mg/dL (ref 0–149)
VLDL Cholesterol Cal: 13 mg/dL (ref 5–40)

## 2020-11-21 ENCOUNTER — Ambulatory Visit: Payer: Self-pay | Admitting: Physician Assistant

## 2020-11-21 ENCOUNTER — Other Ambulatory Visit: Payer: Self-pay

## 2020-11-21 VITALS — BP 153/83 | HR 80 | Temp 98.4°F | Resp 18 | Ht 70.0 in | Wt 166.0 lb

## 2020-11-21 DIAGNOSIS — R200 Anesthesia of skin: Secondary | ICD-10-CM

## 2020-11-21 DIAGNOSIS — R202 Paresthesia of skin: Secondary | ICD-10-CM

## 2020-11-21 DIAGNOSIS — E119 Type 2 diabetes mellitus without complications: Secondary | ICD-10-CM

## 2020-11-21 DIAGNOSIS — R2689 Other abnormalities of gait and mobility: Secondary | ICD-10-CM

## 2020-11-21 DIAGNOSIS — G510 Bell's palsy: Secondary | ICD-10-CM

## 2020-11-21 NOTE — Patient Instructions (Signed)
Please let us know if there is anything else we can do for you  Roney Jaffe, PA-C Physician Assistant [facility] [email or web address]   Health Maintenance, Male Adopting a healthy lifestyle and getting preventive care are important in promoting health and wellness. Ask your health care provider about: The right schedule for you to have regular tests and exams. Things you can do on your own to prevent diseases and keep yourself healthy. What should I know about diet, weight, and exercise? Eat a healthy diet  Eat a diet that includes plenty of vegetables, fruits, low-fat dairy products, and lean protein. Do not eat a lot of foods that are high in solid fats, added sugars, or sodium.  Maintain a healthy weight Body mass index (BMI) is a measurement that can be used to identify possible weight problems. It estimates body fat based on height and weight. Your health care provider can help determine your BMI and help you achieve or maintain ahealthy weight. Get regular exercise Get regular exercise. This is one of the most important things you can do for your health. Most adults should: Exercise for at least 150 minutes each week. The exercise should increase your heart rate and make you sweat (moderate-intensity exercise). Do strengthening exercises at least twice a week. This is in addition to the moderate-intensity exercise. Spend less time sitting. Even light physical activity can be beneficial. Watch cholesterol and blood lipids Have your blood tested for lipids and cholesterol at 55 years of age, then havethis test every 5 years. You may need to have your cholesterol levels checked more often if: Your lipid or cholesterol levels are high. You are older than 55 years of age. You are at high risk for heart disease. What should I know about cancer screening? Many types of cancers can be detected early and may often be prevented.  Depending on your health history and family history, you may need to have cancer screening at various ages. This may include screening for: Colorectal cancer. Prostate cancer. Skin cancer. Lung cancer. What should I know about heart disease, diabetes, and high blood pressure? Blood pressure and heart disease High blood pressure causes heart disease and increases the risk of stroke. This is more likely to develop in people who have high blood pressure readings, are of African descent, or are overweight. Talk with your health care provider about your target blood pressure readings. Have your blood pressure checked: Every 3-5 years if you are 92-46 years of age. Every year if you are 60 years old or older. If you are between the ages of 63 and 64 and are a current or former smoker, ask your health care provider if you should have a one-time screening for abdominal aortic aneurysm (AAA). Diabetes Have regular diabetes screenings. This checks your fasting blood sugar level. Have the screening done: Once every three years after age 72 if you are at a normal weight and have a low risk for diabetes. More often and at a younger age if you are overweight or have a high risk for diabetes. What should I know about preventing infection? Hepatitis B If you have a higher risk for hepatitis B, you should be screened for this virus. Talk with your health care provider to find out if you are at risk forhepatitis B infection. Hepatitis C Blood testing is recommended for: Everyone born from [birth year] through 1965. Anyone with known risk factors for hepatitis C. Sexually transmitted infections (STIs) You should be screened each  year for STIs, including gonorrhea and chlamydia, if: You are sexually active and are younger than 55 years of age. You are older than 55 years of age and your health care provider tells you that you are at risk for this type of infection. Your sexual activity has changed since you were  last screened, and you are at increased risk for chlamydia or gonorrhea. Ask your health care provider if you are at risk. Ask your health care provider about whether you are at high risk for HIV. Your health care provider may recommend a prescription medicine to help prevent HIV infection. If you choose to take medicine to prevent HIV, you should first get tested for HIV. You should then be tested every 3 months for as long as you are taking the medicine. Follow these instructions at home: Lifestyle Do not use any products that contain nicotine or tobacco, such as cigarettes, e-cigarettes, and chewing tobacco. If you need help quitting, ask your health care provider. Do not use street drugs. Do not share needles. Ask your health care provider for help if you need support or information about quitting drugs. Alcohol use Do not drink alcohol if your health care provider tells you not to drink. If you drink alcohol: Limit how much you have to 0-2 drinks a day. Be aware of how much alcohol is in your drink. In the [place] one drink equals one 12 oz bottle of beer (355 mL), one 5 oz glass of wine (148 mL), or one 1 oz glass of hard liquor (44 mL). General instructions Schedule regular health, dental, and eye exams. Stay current with your vaccines. Tell your health care provider if: You often feel depressed. You have ever been abused or do not feel safe at home. Summary Adopting a healthy lifestyle and getting preventive care are important in promoting health and wellness. Follow your health care provider's instructions about healthy diet, exercising, and getting tested or screened for diseases. Follow your health care provider's instructions on monitoring your cholesterol and blood pressure. This information is not intended to replace advice given to you by your health care provider. Make sure you discuss any questions you have with your healthcare provider. Document Revised: 04/21/2018 Document  Reviewed: 04/21/2018 Elsevier Patient Education  2022 [employer].

## 2020-11-21 NOTE — Progress Notes (Signed)
Patient request referral to ENT for hearing loss and Neuro for continued arm tingling. Patient has taken medication and has eaten today.

## 2020-11-21 NOTE — Progress Notes (Signed)
Established Patient Office Visit  Subjective:  Patient ID: Matthew Jennings, male    DOB: [birth year]  Age: 55 y.o. MRN: [id number]  CC:  Chief Complaint  Patient presents with   arm numbness     HPI Matthew Jennings reports that he continues to have numbness and tingling in his left arm and hand, has been having difficulty with balance issues.  Reports that this has all been present since he was diagnosed with left-sided Bell's palsy.  Reports that he has not been able to work and is becoming frustrated because he does not feel anyone is helping him resolve his health issues.  Does endorse that he saw neurology August 20, 2020.     ASSESSMENT AND PLAN   55 y.o. year old male here with:   Dx:   1. Left-sided Bell's palsy  2. Numbness and tingling in left hand  3. Migraine with aura and without status migrainosus, not intractable         PLAN:     LEFT BELL'S PALSY (improving) - continue supportive care     LEFT HAND NUMBNESS (likely peripheral neuropathy; carpal tunnel syndrome + ulnar neuropathy; in setting of diabetic polyneuropathy) - recently started diabetes treatments; recommend to continue diabetes control and monitor; may consider EMG/NCS in future     MIGRAINE WITH AURA   MIGRAINE PREVENTION LIFESTYLE CHANGES -Stop or avoid smoking -Decrease or avoid caffeine / alcohol -Eat and sleep on a regular schedule -Exercise several times per week - start amitriptyline 72m at bedtime (can also help with nerve pain)   MIGRAINE RESCUE - ibuprofen, tylenol as needed - start rizatriptan (Maxalt) 150mas needed for breakthrough headache; may repeat x 1 after 2 hours; max 2 tabs per day or 8 per month     DIABETES - medical mgmt per PCP     Past Medical History:  Diagnosis Date   Bell's palsy    left   Diabetes mellitus without complication (HCC)    Facial paralysis/Bells palsy    Hx of migraines     Past Surgical History:  Procedure Laterality Date    KNEE RECONSTRUCTION Left     Family History  Problem Relation Age of Onset   Cancer Mother    Alzheimer's disease Father     Social History   Socioeconomic History   Marital status: Single    Spouse name: Not on file   Number of children: 0   Years of education: Not on file   Highest education level: Associate degree: occupational, teHotel manageror vocational program  Occupational History    Comment: EMT  Tobacco Use   Smoking status: Passive Smoke Exposure - Never Smoker   Smokeless tobacco: Never  Substance and Sexual Activity   Alcohol use: Yes    Comment: occ   Drug use: Not Currently   Sexual activity: Not Currently  Other Topics Concern   Not on file  Social History Narrative   Lives alone   Social Determinants of Health   Financial Resource Strain: Not on file  Food Insecurity: Not on file  Transportation Needs: Not on file  Physical Activity: Not on file  Stress: Not on file  Social Connections: Not on file  Intimate Partner Violence: Not on file    Outpatient Medications Prior to Visit  Medication Sig Dispense Refill   amitriptyline (ELAVIL) 25 MG tablet Take 1 tablet (25 mg total) by mouth at bedtime. 30 tablet 6   atorvastatin (LIPITOR) 40 MG  tablet Take 1 tablet (40 mg total) by mouth daily. 30 tablet 2   Blood Glucose Monitoring Suppl (TRUE METRIX METER) w/Device KIT Use as directed 1 kit 0   carbamide peroxide (DEBROX) 6.5 % OTIC solution Place 5 drops into the right ear 2 (two) times daily. (Patient not taking: Reported on 08/20/2020) 15 mL 2   dapagliflozin propanediol (FARXIGA) 5 MG TABS tablet Take 1 tablet (5 mg total) by mouth daily before breakfast. 30 tablet 2   Dulaglutide (TRULICITY) 1.5 OY/7.7AJ SOPN Inject 1.5 mg into the skin once a week. 2 mL 2   glucose blood (TRUE METRIX BLOOD GLUCOSE TEST) test strip Use as instructed 100 each 12   metFORMIN (GLUCOPHAGE) 1000 MG tablet Take 1 tablet (1,000 mg total) by mouth 2 (two) times daily with a  meal. 180 tablet 0   naproxen sodium (ALEVE) 220 MG tablet Take 220 mg by mouth.     rizatriptan (MAXALT-MLT) 10 MG disintegrating tablet Take 1 tablet (10 mg total) by mouth as needed for migraine. May repeat in 2 hours if needed 9 tablet 11   TRUEplus Lancets 28G MISC USE AS DIRECTED (Patient not taking: Reported on 08/20/2020) 100 each 4   No facility-administered medications prior to visit.    Allergies  Allergen Reactions   Penicillins Rash    ROS Review of Systems  Constitutional: Negative.   HENT: Negative.    Eyes: Negative.   Respiratory:  Negative for shortness of breath.   Cardiovascular:  Negative for chest pain.  Gastrointestinal: Negative.   Endocrine: Negative.   Genitourinary: Negative.   Musculoskeletal:  Positive for arthralgias, gait problem and myalgias.  Skin: Negative.   Allergic/Immunologic: Negative.   Neurological:  Positive for weakness. Negative for syncope.  Hematological: Negative.   Psychiatric/Behavioral: Negative.       Objective:    Physical Exam Vitals and nursing note reviewed.  Constitutional:      General: He is not in acute distress.    Appearance: Normal appearance. He is not ill-appearing.  HENT:     Head: Normocephalic and atraumatic.     Right Ear: External ear normal.     Left Ear: External ear normal.     Nose: Nose normal.     Mouth/Throat:     Mouth: Mucous membranes are moist.     Pharynx: Oropharynx is clear.  Eyes:     Extraocular Movements: Extraocular movements intact.     Conjunctiva/sclera: Conjunctivae normal.     Pupils: Pupils are equal, round, and reactive to light.  Cardiovascular:     Rate and Rhythm: Normal rate and regular rhythm.     Pulses: Normal pulses.     Heart sounds: Normal heart sounds.  Pulmonary:     Effort: Pulmonary effort is normal.     Breath sounds: Normal breath sounds.  Musculoskeletal:     Right upper arm: No swelling or deformity.     Left upper arm: No swelling or deformity.      Right forearm: No swelling.     Left forearm: No swelling.     Right hand: No swelling. Decreased strength.     Left hand: No swelling.     Cervical back: Normal range of motion and neck supple.  Skin:    General: Skin is warm and dry.  Neurological:     General: No focal deficit present.     Mental Status: He is alert and oriented to person, place, and time.  Psychiatric:  Mood and Affect: Mood normal.        Behavior: Behavior normal.        Thought Content: Thought content normal.        Judgment: Judgment normal.    BP (!) 153/83 (BP Location: Left Arm, Patient Position: Sitting, Cuff Size: Normal)   Pulse 80   Temp 98.4 F (36.9 C) (Temporal)   Resp 18   Ht '5\' 10"'  (1.778 m)   Wt 166 lb (75.3 kg)   SpO2 96%   BMI 23.82 kg/m  Wt Readings from Last 3 Encounters:  11/21/20 166 lb (75.3 kg)  09/03/20 170 lb 3.2 oz (77.2 kg)  08/20/20 176 lb (79.8 kg)     Health Maintenance Due  Topic Date Due   COVID-19 Vaccine (1) Never done   FOOT EXAM  Never done   OPHTHALMOLOGY EXAM  Never done   Zoster Vaccines- Shingrix (1 of 2) Never done    There are no preventive care reminders to display for this patient.  Lab Results  Component Value Date   TSH 1.720 07/06/2020   Lab Results  Component Value Date   WBC 5.7 05/17/2020   HGB 14.9 05/17/2020   HCT 43.2 05/17/2020   MCV 85.5 05/17/2020   PLT 205 05/17/2020   Lab Results  Component Value Date   NA 141 09/03/2020   K 4.2 09/03/2020   CO2 24 09/03/2020   GLUCOSE 234 (H) 09/03/2020   BUN 22 09/03/2020   CREATININE 0.94 09/03/2020   BILITOT 0.6 05/17/2020   ALKPHOS 99 05/17/2020   AST 23 05/17/2020   ALT 33 05/17/2020   PROT 8.3 (H) 05/17/2020   ALBUMIN 4.6 05/17/2020   CALCIUM 9.5 09/03/2020   ANIONGAP 11 05/17/2020   EGFR 96 09/03/2020   Lab Results  Component Value Date   CHOL 201 (H) 11/01/2020   Lab Results  Component Value Date   HDL 53 11/01/2020   Lab Results  Component Value Date    LDLCALC 135 (H) 11/01/2020   Lab Results  Component Value Date   TRIG 70 11/01/2020   Lab Results  Component Value Date   CHOLHDL 3.8 11/01/2020   Lab Results  Component Value Date   HGBA1C 10.7 (A) 09/03/2020      Assessment & Plan:   Problem List Items Addressed This Visit       Endocrine   Type 2 diabetes mellitus (HCC)     Nervous and Auditory   Bell's palsy - Primary     Other   Balance problem   Numbness and tingling in left hand    No orders of the defined types were placed in this encounter.  1. Bell's palsy Patient encouraged to keep follow-up appointment with neurology.  We did attempt to call the neurologist to see if we can get patient in sooner, but unfortunately no earlier appointments are available.  Patient encouraged to continue working on previously prescribed exercises.  Continue working on lowering blood glucose levels.  Red flags given for prompt reevaluation  2. Balance problem   3. Numbness and tingling in left hand   4. Type 2 diabetes mellitus without complication, unspecified whether long term insulin use ([place])   I have reviewed the patient's medical history (PMH, PSH, Social History, Family History, Medications, and allergies) , and have been updated if relevant. I spent 27 minutes reviewing chart and  face to face time with patient.    Follow-up: Return for with Durene Fruits, NP  at [facility] at [facility].    Loraine Grip Mayers, PA-C

## 2020-11-22 ENCOUNTER — Encounter: Payer: Self-pay | Admitting: Physician Assistant

## 2020-11-22 DIAGNOSIS — R200 Anesthesia of skin: Secondary | ICD-10-CM | POA: Insufficient documentation

## 2020-11-23 ENCOUNTER — Other Ambulatory Visit: Payer: Self-pay

## 2020-11-29 ENCOUNTER — Telehealth: Payer: Self-pay | Admitting: Pharmacist

## 2020-11-29 ENCOUNTER — Other Ambulatory Visit: Payer: Self-pay

## 2020-11-29 ENCOUNTER — Ambulatory Visit: Payer: Self-pay | Attending: Family | Admitting: Pharmacist

## 2020-11-29 DIAGNOSIS — E119 Type 2 diabetes mellitus without complications: Secondary | ICD-10-CM

## 2020-11-29 LAB — POCT GLYCOSYLATED HEMOGLOBIN (HGB A1C): Hemoglobin A1C: 7.4 % — AB (ref 4.0–5.6)

## 2020-11-29 MED ORDER — METFORMIN HCL 1000 MG PO TABS
1000.0000 mg | ORAL_TABLET | Freq: Two times a day (BID) | ORAL | 2 refills | Status: DC
  Filled 2020-11-29: qty 60, 30d supply, fill #0
  Filled 2021-01-24: qty 60, 30d supply, fill #1
  Filled 2021-02-19: qty 60, 30d supply, fill #2

## 2020-11-29 MED ORDER — TRUE METRIX BLOOD GLUCOSE TEST VI STRP
ORAL_STRIP | 12 refills | Status: DC
  Filled 2020-11-29: qty 100, 30d supply, fill #0

## 2020-11-29 NOTE — Progress Notes (Signed)
    S:    PCP: Rema Fendt, NP No chief complaint on file.  Patient arrives in good spirits. Presents for diabetes evaluation, education, and management. Patient was referred and last seen by Primary Care Provider on 09/03/20. Patient was last seen by pharmacy on 11/01/2020. At that visit, we increased his Trulicity to 1.5 mg weekly. Also, we increased his atorvastatin to 40 mg daily.   Patient's diabetes is longstanding. Today, he reports doing well. Patient reports adherence to medications. Denies side effects.   Family/Social History:  FH: no pertinent positives Tobacco: never smoker Alcohol: occasional use   Insurance coverage/medication affordability: self-pay  Current diabetes medications include: metformin 1000mg  BID, Farxiga 5mg  daily, Trulicity 1.5mg  once weekly Current hyperlipidemia medications include: Atorvastatin 40mg  once daily    Patient denies nocturia.  Patient reports neuropathy (nerve pain). Patient reports visual changes. Patient reports self foot exams.      O:   Lab Results  Component Value Date   HGBA1C 7.4 (A) 11/29/2020   There were no vitals filed for this visit.  Lipid Panel     Component Value Date/Time   CHOL 201 (H) 11/01/2020 1111   TRIG 70 11/01/2020 1111   HDL 53 11/01/2020 1111   CHOLHDL 3.8 11/01/2020 1111   LDLCALC 135 (H) 11/01/2020 1111    Clinical Atherosclerotic Cardiovascular Disease (ASCVD): No  The 10-year ASCVD risk score 11/03/2020 DC Jr., et al., 2013) is: 13.5%   Values used to calculate the score:     Age: 55 years     Sex: Male     Is Non-Hispanic African American: No     Diabetic: Yes     Tobacco smoker: No     Systolic Blood Pressure: 153 mmHg     Is BP treated: No     HDL Cholesterol: 53 mg/dL     Total Cholesterol: 201 mg/dL    A/P: Diabetes longstanding. A1c today reveals improvement. A1c is 7.4% (down from 10.7%). Patient is able to verbalize appropriate hypoglycemia management plan. Medication adherence  appears optimal. Commended patient on improvement. No changes for now. Will send message to pt's PCP regarding management of neuropathy.  -Continue Trulicity to 1.5mg  once weekly.  -Continue Metformin 1000mg  BID (instructed to take as prescribed) -Farxiga 5mg  once daily  -Extensively discussed pathophysiology of diabetes, recommended lifestyle interventions, dietary effects on blood sugar control -Counseled on s/sx of and management of hypoglycemia -Next A1C anticipated 02/2021.   ASCVD risk - primary prevention in patient with diabetes. Last LDL is not controlled. ASCVD risk score is not >20%. We increased his atorvastatin at his last visit and will continue high intensity statin.   -Continue Atorvastatin 40 mg.   Written patient instructions provided.  Total time in face to face counseling 30 minutes.   Follow up with w/PCP.     53, PharmD, , CPP Clinical Pharmacist [facility] [phone]

## 2020-11-29 NOTE — Telephone Encounter (Signed)
Saw pt today for DM management. His A1c reveals improvement (A1c down from 10.7 to 7.4%). Despite improved CBG control, pt continues to endorse numbness and paresthesia in his feet. He is interested in trying gabapentin for management of diabetic neuropathy. Will forward to patient's PCP for follow-up.  Amy,   Would you consider sending gabapentin rx if appropriate? Also, will yu route to me your response so I can call and let him know? Thank you for including me!

## 2020-11-30 ENCOUNTER — Other Ambulatory Visit: Payer: Self-pay | Admitting: Family

## 2020-11-30 ENCOUNTER — Other Ambulatory Visit: Payer: Self-pay

## 2020-11-30 DIAGNOSIS — E1141 Type 2 diabetes mellitus with diabetic mononeuropathy: Secondary | ICD-10-CM

## 2020-11-30 DIAGNOSIS — E1140 Type 2 diabetes mellitus with diabetic neuropathy, unspecified: Secondary | ICD-10-CM | POA: Insufficient documentation

## 2020-11-30 MED ORDER — GABAPENTIN 300 MG PO CAPS
300.0000 mg | ORAL_CAPSULE | Freq: Every day | ORAL | 0 refills | Status: DC
  Filled 2020-11-30: qty 30, 30d supply, fill #0

## 2020-11-30 NOTE — Telephone Encounter (Signed)
Patient informed. Thank you

## 2020-11-30 NOTE — Telephone Encounter (Signed)
Matthew Jennings,   Per patient request Gabapentin (Neurontin) prescribed for course trial to assist with diabetic neuropathy. Please let patient know Gabapentin may cause drowsiness. To not consume if operating heavy machinery, driving, or working. To not consume with alcohol or illicit substances. Notify provider should he have any side effects and seek immediate medical evaluation if needed.

## 2020-12-07 ENCOUNTER — Other Ambulatory Visit: Payer: Self-pay

## 2020-12-14 ENCOUNTER — Other Ambulatory Visit: Payer: Self-pay

## 2020-12-14 ENCOUNTER — Ambulatory Visit: Payer: Self-pay | Admitting: Otolaryngology

## 2020-12-14 ENCOUNTER — Ambulatory Visit: Payer: Self-pay | Attending: Family Medicine

## 2020-12-21 ENCOUNTER — Other Ambulatory Visit: Payer: Self-pay

## 2020-12-21 ENCOUNTER — Encounter: Payer: Self-pay | Admitting: Family Medicine

## 2020-12-21 MED ORDER — GABAPENTIN 300 MG PO CAPS
300.0000 mg | ORAL_CAPSULE | Freq: Every day | ORAL | 0 refills | Status: DC
  Filled 2020-12-21: qty 90, 90d supply, fill #0

## 2020-12-27 NOTE — Progress Notes (Signed)
Patient not seen by provider

## 2020-12-28 ENCOUNTER — Other Ambulatory Visit: Payer: Self-pay

## 2020-12-28 ENCOUNTER — Ambulatory Visit: Payer: Self-pay | Attending: Family

## 2020-12-28 ENCOUNTER — Ambulatory Visit: Payer: Self-pay | Admitting: Family

## 2020-12-28 ENCOUNTER — Telehealth: Payer: Self-pay | Admitting: Family Medicine

## 2021-01-03 ENCOUNTER — Telehealth: Payer: Self-pay | Admitting: Family

## 2021-01-03 NOTE — Telephone Encounter (Signed)
I return Pt call, he was inform that the application will start the process today and it will take at least 3 week to get an answer, for the financial program

## 2021-01-03 NOTE — Telephone Encounter (Signed)
Copied from CRM [id number]. Topic: General - Inquiry >> Jan 03, 2021 11:06 AM Aretta Nip wrote: Reason for CRM: pt states he is returning a call from [place]... pls fu at [phone]

## 2021-01-10 ENCOUNTER — Ambulatory Visit: Payer: Self-pay | Admitting: Family Medicine

## 2021-01-24 ENCOUNTER — Other Ambulatory Visit: Payer: Self-pay

## 2021-01-24 ENCOUNTER — Other Ambulatory Visit: Payer: Self-pay | Admitting: Family Medicine

## 2021-01-24 MED ORDER — TRULICITY 1.5 MG/0.5ML ~~LOC~~ SOAJ
1.5000 mg | SUBCUTANEOUS | 0 refills | Status: DC
  Filled 2021-01-24: qty 2, 28d supply, fill #0
  Filled 2021-02-19: qty 2, 28d supply, fill #1

## 2021-01-24 NOTE — Telephone Encounter (Signed)
Dulaglutide (Trulicity) refilled per patient request for courtesy 30 day supply. Please schedule in-person visit for medication refills as hemoglobin A1c will be due October 2022.

## 2021-01-25 ENCOUNTER — Other Ambulatory Visit: Payer: Self-pay

## 2021-01-28 ENCOUNTER — Ambulatory Visit: Payer: Self-pay | Admitting: Family Medicine

## 2021-01-29 ENCOUNTER — Other Ambulatory Visit: Payer: Self-pay

## 2021-02-01 ENCOUNTER — Other Ambulatory Visit: Payer: Self-pay

## 2021-02-12 ENCOUNTER — Telehealth: Payer: Self-pay | Admitting: Family Medicine

## 2021-02-12 NOTE — Telephone Encounter (Signed)
OV cancelled due to NP out- phone # out of service, mychart msg sent

## 2021-02-13 NOTE — Telephone Encounter (Signed)
Dr. Marjory Lies,  I was not sure what else to offer this pt. You did not have anything sooner than 03/06/21. Would he be ok to see Shanda Bumps as scheduled?   Called and spoke w/ pt. Appt had to be cx 02/18/21 w/ Amy d/t her being out. Pt insisted on appt asap d/t having disability hearing in Nov. He expressed dissatisfaction stating he has had this appt since April and needed "testing" completed. I apologized for the inconvenience and asked what testing he is referring to as I did not see anything in last note about testing being ordered. He states "testing to figure out what is going on/causing sx". I read that Dr. Marjory Lies mentioned doing EMG/NCS in the future if needed but had not ordered yet. He was going to have him follow up first and do re-eval before determining next steps. There were no available appt except w/ Jessica on 02/18/21. He did not want appt on 03/06/21 at 230p w/ Dr. Marjory Lies. Aware we have to speak w/ MD to ensure this is appropriate. I reiterated that no testing will be done at this visit, this is a 6 month f/u from April to get updated exam, discuss how things are going since last visit.  He states he needs to have it be determined whether he can return to work or if he would qualify for disability. Advised NP can complete exam/do visit and if any questions, can reach out to MD. However, I do need to speak with MD to make sure this is appropriate for him to see NP given this new information. We may need to makes changes to appt if needed. He verbalized understanding.

## 2021-02-13 NOTE — Telephone Encounter (Signed)
Tried calling pt back, # not working. Will send mychart message.

## 2021-02-13 NOTE — Telephone Encounter (Signed)
Pt called stating that he has been waiting for a long time to be seen and he is needing this appt sooner than the appt that was offered. Pt states he can not do VV. Pt has a hearing coming up in Nov and is needing this appt before the hearing. Please advise.

## 2021-02-18 ENCOUNTER — Other Ambulatory Visit: Payer: Self-pay

## 2021-02-18 ENCOUNTER — Ambulatory Visit (INDEPENDENT_AMBULATORY_CARE_PROVIDER_SITE_OTHER): Payer: Self-pay | Admitting: Adult Health

## 2021-02-18 ENCOUNTER — Ambulatory Visit: Payer: Self-pay | Admitting: Family Medicine

## 2021-02-18 ENCOUNTER — Encounter: Payer: Self-pay | Admitting: Adult Health

## 2021-02-18 VITALS — BP 126/86 | HR 99 | Ht 70.0 in | Wt 167.0 lb

## 2021-02-18 DIAGNOSIS — G510 Bell's palsy: Secondary | ICD-10-CM

## 2021-02-18 DIAGNOSIS — R202 Paresthesia of skin: Secondary | ICD-10-CM

## 2021-02-18 DIAGNOSIS — R531 Weakness: Secondary | ICD-10-CM

## 2021-02-18 DIAGNOSIS — R200 Anesthesia of skin: Secondary | ICD-10-CM

## 2021-02-18 MED ORDER — CLOPIDOGREL BISULFATE 75 MG PO TABS
75.0000 mg | ORAL_TABLET | Freq: Every day | ORAL | 11 refills | Status: DC
  Filled 2021-02-18: qty 30, 30d supply, fill #0
  Filled 2021-04-08: qty 30, 30d supply, fill #1
  Filled 2021-06-10: qty 30, 30d supply, fill #0

## 2021-02-18 NOTE — Patient Instructions (Signed)
You are cleared to return back to work but please ensure you take it easy for the first few days and do not over due it! If you have difficulty with your balance or left leg issues, we may need to have you participate in therapies  If symptoms should worsen, we may need to proceed to doing an EMG/NCV  Start clopidogrel 75 mg daily  and continue atorvastatin 40mg  daily  for secondary stroke prevention  Continue to follow up with PCP regarding cholesterol, diabetes and blood pressure management  Maintain strict control of hypertension with blood pressure goal below 130/90, diabetes with hemoglobin A1c goal below 7.0% and cholesterol with LDL cholesterol (bad cholesterol) goal below 70 mg/dL.       Followup in the future with me in 4 months or call earlier if needed       Thank you for coming to see at [facility]. I hope we have been able to provide you high quality care today.  You may receive a patient satisfaction survey over the next few weeks. We would appreciate your feedback and comments so that we may continue to improve ourselves and the health of our patients.

## 2021-02-18 NOTE — Progress Notes (Signed)
[facility]  PATIENT: Matthew Jennings DOB: 1966/04/24  REFERRING CLINICIAN: Camillia Herter, NP HISTORY FROM: patient  REASON FOR VISIT: new consult    HISTORICAL  CHIEF COMPLAINT:  Chief Complaint  Patient presents with   Follow-up    Rm 3 alone   Pt is well, has occasional headaches as well as occasional speech difficulty, weakness in L hand and some imbalance on L side.     HISTORY OF PRESENT ILLNESS:   Today, 02/18/2021, Mr. Matthew Jennings returns for 6 months follow-up.  No residual facial weakness or reoccurring of symptoms.  Continued left hand weakness with numbness (ring and pinky finger and occasional thumb) with difficulty picking up smaller objects and left leg numbness with leg giving out leading to imbalance.  No AD and no falls. Denies any associated pain such as in neck, lower back, hip or knee.  Also reports occasional slurred speech especially when trying to speak quicker.  All symptoms persistent since 05/2020.  Denies new stroke/TIA symptoms.  Reports improvement of bilateral foot numbness as glucose levels improved (most recent A1c 7.4 down from 10.4).  Migraine headaches improved since prior visit - only took 1 month of amitriptyline as he was not aware he had refills.  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 He has been doing cooking at home (like he would at work) without difficulty.  Remains on atorvastatin 40 mg daily.  Blood pressure today 126/86.  No further concerns at this time.    Initial consult visit 08/20/2020 Dr. Leta Baptist: 55 year old male here for evaluation of Bell's palsy.  January 2022 patient had onset of left facial weakness, went to the ER for evaluation.  He was also having left hand numbness at the time and had MRI of the brain which showed enhancement of the left facial nerve.  No cause for left hand numbness was found.  Patient followed up with ENT and Bell's palsy symptoms have improved.  Patient continues  to have left hand numbness and weakness, mainly affecting digits 1 and 5.  Patient also has bilateral toe and feet numbness.  Patient has new diagnosis of diabetes, to started medications about 1 month ago.  A1c is greater than 11.  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  Patient has history of migraine since age 78 years old with global intense squeezing throbbing sensation, sensitive to light and sound, triggered by weather changes.  Having 3 headaches per week.  He has tried Aleve and Keppra in the past without relief.    REVIEW OF SYSTEMS: Full 14 system review of systems performed and negative with exception of: as per HPI.   ALLERGIES: Allergies  Allergen Reactions   Aspirin Other (See Comments)   Penicillins Rash    HOME MEDICATIONS: Outpatient Medications Prior to Visit  Medication Sig Dispense Refill   amitriptyline (ELAVIL) 25 MG tablet Take 1 tablet (25 mg total) by mouth at bedtime. 30 tablet 6   atorvastatin (LIPITOR) 40 MG tablet Take 1 tablet (40 mg total) by mouth daily. 30 tablet 2   Blood Glucose Monitoring Suppl (TRUE METRIX METER) w/Device KIT Use as directed 1 kit 0   carbamide peroxide (DEBROX) 6.5 % OTIC solution Place 5 drops into the right ear 2 (two) times daily. 15 mL 2   dapagliflozin propanediol (FARXIGA) 5 MG TABS tablet Take 1 tablet (5 mg total) by mouth  daily before breakfast. 30 tablet 2   Dulaglutide (TRULICITY) 1.5 ZY/2.4MG SOPN Inject 1.5 mg into the skin once a week. 2.5 mL 0   glucose blood (TRUE METRIX BLOOD GLUCOSE TEST) test strip Use as instructed 100 each 12   metFORMIN (GLUCOPHAGE) 1000 MG tablet Take 1 tablet (1,000 mg total) by mouth 2 (two) times daily with a meal. 60 tablet 2   naproxen sodium (ALEVE) 220 MG tablet Take 220 mg by mouth.     rizatriptan (MAXALT-MLT) 10 MG disintegrating tablet Take 1 tablet (10 mg total) by mouth as needed for migraine. May repeat in 2  hours if needed 9 tablet 11   TRUEplus Lancets 28G MISC USE AS DIRECTED 100 each 4   gabapentin (NEURONTIN) 300 MG capsule Take 1 capsule (300 mg total) by mouth at bedtime. 90 capsule 0   No facility-administered medications prior to visit.    PAST MEDICAL HISTORY: Past Medical History:  Diagnosis Date   Bell's palsy    left   Diabetes mellitus without complication (HCC)    Facial paralysis/Bells palsy    Hx of migraines     PAST SURGICAL HISTORY: Past Surgical History:  Procedure Laterality Date   KNEE RECONSTRUCTION Left     FAMILY HISTORY: Family History  Problem Relation Age of Onset   Cancer Mother    Alzheimer's disease Father     SOCIAL HISTORY: Social History   Socioeconomic History   Marital status: Single    Spouse name: Not on file   Number of children: 0   Years of education: Not on file   Highest education level: Associate degree: occupational, Hotel manager, or vocational program  Occupational History    Comment: EMT  Tobacco Use   Smoking status: Passive Smoke Exposure - Never Smoker   Smokeless tobacco: Never  Substance and Sexual Activity   Alcohol use: Yes    Comment: occ   Drug use: Not Currently   Sexual activity: Not Currently  Other Topics Concern   Not on file  Social History Narrative   Lives alone   Social Determinants of Health   Financial Resource Strain: Not on file  Food Insecurity: Not on file  Transportation Needs: Not on file  Physical Activity: Not on file  Stress: Not on file  Social Connections: Not on file  Intimate Partner Violence: Not on file     PHYSICAL EXAM  GENERAL EXAM/CONSTITUTIONAL: Vitals:  Vitals:   02/18/21 1508  BP: 126/86  Pulse: 99  Weight: 167 lb (75.8 kg)  Height: _0  (1.778 m)   Body mass index is 23.96 kg/m. Wt Readings from Last 3 Encounters:  02/18/21 167 lb (75.8 kg)  11/21/20 166 lb (75.3 kg)  09/03/20 170 lb 3.2 oz (77.2 kg)   Patient is in no distress; well developed,  nourished and groomed; neck is supple  CARDIOVASCULAR: Examination of carotid arteries is normal; no carotid bruits Regular rate and rhythm, no murmurs Examination of peripheral vascular system by observation and palpation is normal  EYES: Ophthalmoscopic exam of optic discs and posterior segments is normal; no papilledema or hemorrhages  MUSCULOSKELETAL: Gait, strength, tone, movements noted in Neurologic exam below  NEUROLOGIC: MENTAL STATUS:  awake, alert, oriented to person, place and time recent and remote memory intact normal attention and concentration language fluent, comprehension intact, naming intact fund of knowledge appropriate  CRANIAL NERVE:  2nd - no papilledema on fundoscopic exam 2nd, 3rd, 4th, 6th - pupils equal and reactive to light, visual  fields full to confrontation, extraocular muscles intact, no nystagmus 5th - facial sensation symmetric 7th - facial strength symmetric 8th - hearing intact 9th - palate elevates symmetrically, uvula midline 11th - shoulder shrug symmetric 12th - tongue protrusion midline  MOTOR:  normal bulk and tone, full strength in the BUE, BLE; EXCEPT MILD ATROPHY AND WEAKNESS OF LEFT HAND INTRINSIC AND THENAR MUSCLES AND SLIGHT LEFT HIP FLEXOR WEAKNESS  SENSORY:  normal and symmetric to light touch, pinprick, temperature, vibration; EXCEPT DECR PP IN LEFT HAND DIGIT 1 AND 5  COORDINATION:  finger-nose-finger mild incoordination of LUE, fine finger movements normal  REFLEXES:  deep tendon reflexes TRACE and symmetric  GAIT/STATION:  narrow based gait with decreased LLE stride length and step height without use of assistive device     DIAGNOSTIC DATA (LABS, IMAGING, TESTING) - I reviewed patient records, labs, notes, testing and imaging myself where available.  Lab Results  Component Value Date   WBC 5.7 05/17/2020   HGB 14.9 05/17/2020   HCT 43.2 05/17/2020   MCV 85.5 05/17/2020   PLT 205 05/17/2020       Component Value Date/Time   NA 141 09/03/2020 1210   K 4.2 09/03/2020 1210   CL 102 09/03/2020 1210   CO2 24 09/03/2020 1210   GLUCOSE 234 (H) 09/03/2020 1210   GLUCOSE 348 (H) 05/17/2020 1416   BUN 22 09/03/2020 1210   CREATININE 0.94 09/03/2020 1210   CALCIUM 9.5 09/03/2020 1210   PROT 8.3 (H) 05/17/2020 1416   ALBUMIN 4.6 05/17/2020 1416   AST 23 05/17/2020 1416   ALT 33 05/17/2020 1416   ALKPHOS 99 05/17/2020 1416   BILITOT 0.6 05/17/2020 1416   GFRNONAA >60 05/17/2020 1416   Lab Results  Component Value Date   CHOL 201 (H) 11/01/2020   HDL 53 11/01/2020   LDLCALC 135 (H) 11/01/2020   TRIG 70 11/01/2020   CHOLHDL 3.8 11/01/2020   Lab Results  Component Value Date   HGBA1C 7.4 (A) 11/29/2020   No results found for: VITAMINB12 Lab Results  Component Value Date   TSH 1.720 07/06/2020    05/17/20 MRI brain / IAC  - Prominent asymmetric enhancement of the left seventh cranial nerve extending from the IAC fundus through the mastoid segment. Findings are compatible with Bell's palsy in the appropriate clinical setting. Should the patient's symptoms persist or follow an unexpected course, follow-up brain MRI with contrast and with IAC protocol recommended. - T2 hyperintensity and enhancement within the left mandibular condyle, nonspecific but possibly degenerative or inflammatory in etiology. Clinical correlation is recommended.     ASSESSMENT AND PLAN  55 y.o. year old male here with:  Dx:  1. Left-sided Bell's palsy   2. Left-sided weakness       PLAN:   LEFT BELL'S PALSY (resolved) - continue supportive care  LEFT SIDED WEAKNESS WITH PARESTHESIAS -mild with ongoing improvement - declines interest in therapy.  Discussed return back to work - currently performing all job duties at home as a [employer] without difficulty. Cleared to return back to work with gradually decreasing job functions/responsibilities as tolerated. He plans to f/u with PCP to  obtain letter for official return -discussed possibility of small right sided stroke (?thalamic) not seen on imaging which would be more than likely secondary to small vessel disease in setting of newly diagnosed diabetes and uncontrolled cholesterol - recommend initiating Plavix 75 mg daily (aspirin allergy) and continue atorvastatin 40 mg daily for stroke prevention measures -Discussed  possibly pursuing EMG/NCV to rule out other contributing factors although patient declines interest at this time -Declines interest in further stroke work-up at this present time -Continue to follow with PCP for aggressive stroke risk factor management including HTN with BP goal<130/90, HLD with LDL goal<70 and DM with A1c goal<7.0 -Most recent A1c 7.4 down from 10.7 managed by PCP -Most recent lipid panel LDL 135 therefore atorvastatin increased to 40 mg daily- plans on repeat lipid panel with PCP (currently not fasting).  If LDL remains above goal, would recommend increasing to 80 mg daily  LEFT HAND NUMBNESS (likely peripheral neuropathy; carpal tunnel syndrome + ulnar neuropathy; in setting of diabetic polyneuropathy) -Gradually improving with improvement of symptoms -Discussed conservative measures and monitoring and if symptoms should worsen or progress,   MIGRAINE WITH AURA -Worsened with episode of Bell's palsy currently now at baseline monitor by PCP      Meds ordered this encounter  Medications   clopidogrel (PLAVIX) 75 MG tablet    Sig: Take 1 tablet (75 mg total) by mouth daily.    Dispense:  30 tablet    Refill:  11     Return in about 4 months (around 06/21/2021).   I spent 38 minutes of face-to-face and non-face-to-face time with patient.  This included previsit chart review, lab review, study review, order entry, electronic health record documentation, patient education and discussion regarding continued left-sided symptoms and possible etiology and further evaluation, further stroke  prevention measures and importance of aggressive stroke risk factor management, history of Bell's palsy and migraine with aura and answered all other questions to patient satisfaction  Frann Rider, AGNP-BC  [facility] [address] [place], [place] [address]  Phone [phone] Fax [phone] Note: This document was prepared with digital dictation and possible smart phrase technology. Any transcriptional errors that result from this process are unintentional.  CC:  [facility] provider: Dr. Harriette Bouillon, Amy Lenna Sciara, NP

## 2021-02-19 ENCOUNTER — Other Ambulatory Visit: Payer: Self-pay | Admitting: Family Medicine

## 2021-02-19 ENCOUNTER — Other Ambulatory Visit: Payer: Self-pay

## 2021-02-19 ENCOUNTER — Other Ambulatory Visit: Payer: Self-pay | Admitting: Family

## 2021-02-19 DIAGNOSIS — E785 Hyperlipidemia, unspecified: Secondary | ICD-10-CM

## 2021-02-19 DIAGNOSIS — E119 Type 2 diabetes mellitus without complications: Secondary | ICD-10-CM

## 2021-02-19 NOTE — Telephone Encounter (Signed)
Requested medications are due for refill today.  yes  Requested medications are on the active medications list.  yes  Last refill. 11/01/2020  Future visit scheduled.   no  Notes to clinic.  PCP listed is Amy Zonia Kief

## 2021-02-20 ENCOUNTER — Other Ambulatory Visit: Payer: Self-pay

## 2021-02-20 MED ORDER — ATORVASTATIN CALCIUM 40 MG PO TABS
40.0000 mg | ORAL_TABLET | Freq: Every day | ORAL | 2 refills | Status: DC
  Filled 2021-02-20: qty 30, 30d supply, fill #0
  Filled 2021-04-08: qty 30, 30d supply, fill #1

## 2021-02-20 MED ORDER — DAPAGLIFLOZIN PROPANEDIOL 5 MG PO TABS
5.0000 mg | ORAL_TABLET | Freq: Every day | ORAL | 0 refills | Status: DC
  Filled 2021-02-20: qty 30, 30d supply, fill #0

## 2021-02-20 NOTE — Telephone Encounter (Signed)
Dapagliflozin Propanediol (Farxiga) and Atorvastatin (Lipitor) refilled per patient request for courtesy refill. Last appointment with me for diabetes management 09/03/2020. Please schedule office appointment for additional medication refills and to update hemoglobin A1c.

## 2021-02-21 ENCOUNTER — Other Ambulatory Visit: Payer: Self-pay

## 2021-02-21 MED ORDER — TRULICITY 1.5 MG/0.5ML ~~LOC~~ SOAJ
1.5000 mg | SUBCUTANEOUS | 0 refills | Status: DC
  Filled 2021-02-21: qty 2, 28d supply, fill #0

## 2021-02-25 ENCOUNTER — Other Ambulatory Visit: Payer: Self-pay

## 2021-02-25 NOTE — Progress Notes (Signed)
Patient ID: Matthew Jennings, male    DOB: 1965/12/16  MRN: [id number]  CC: BELL'S PALSY FOLLOW-UP    Subjective: Matthew Jennings is a 55 y.o. male who presents for Bell's palsy.  His concerns today include:  NEUROLOGY FOLLOW-UP: 02/18/2021 at [facility] per NP note: LEFT BELL'S PALSY (resolved) - continue supportive care   LEFT SIDED WEAKNESS WITH PARESTHESIAS -mild with ongoing improvement - declines interest in therapy.  Discussed return back to work - currently performing all job duties at home as a [employer] without difficulty. Cleared to return back to work with gradually decreasing job functions/responsibilities as tolerated. He plans to f/u with PCP to obtain letter for official return -discussed possibility of small right sided stroke (?thalamic) not seen on imaging which would be more than likely secondary to small vessel disease in setting of newly diagnosed diabetes and uncontrolled cholesterol - recommend initiating Plavix 75 mg daily (aspirin allergy) and continue atorvastatin 40 mg daily for stroke prevention measures -Discussed possibly pursuing EMG/NCV to rule out other contributing factors although patient declines interest at this time -Declines interest in further stroke work-up at this present time -Continue to follow with PCP for aggressive stroke risk factor management including HTN with BP goal<130/90, HLD with LDL goal<70 and DM with A1c goal<7.0 -Most recent A1c 7.4 down from 10.7 managed by PCP -Most recent lipid panel LDL 135 therefore atorvastatin increased to 40 mg daily- plans on repeat lipid panel with PCP (currently not fasting).  If LDL remains above goal, would recommend increasing to 80 mg daily   LEFT HAND NUMBNESS (likely peripheral neuropathy; carpal tunnel syndrome + ulnar neuropathy; in setting of diabetic polyneuropathy) -Gradually improving with improvement of symptoms -Discussed conservative measures and monitoring and if  symptoms should worsen or progress,   MIGRAINE WITH AURA -Worsened with episode of Bell's palsy currently now at baseline monitor by PCP  You are cleared to return back to work but please ensure you take it easy for the first few days and do not over due it! If you have difficulty with your balance or left leg issues, we may need to have you participate in therapies   If symptoms should worsen, we may need to proceed to doing an EMG/NCV   Start clopidogrel 75 mg daily  and continue atorvastatin 75m daily  for secondary stroke prevention  Continue to follow up with PCP regarding cholesterol, diabetes and blood pressure management  Maintain strict control of hypertension with blood pressure goal below 130/90, diabetes with hemoglobin A1c goal below 7.0% and cholesterol with LDL cholesterol (bad cholesterol) goal below 70 mg/dL.     Followup in the future with me in 4 months or call earlier if needed  02/26/2021: Reports still having numbness and weakness in left hand but manageable. Reports still having left leg numbness and giving out occasionally. Reports bilateral feet numbness. Denies recent falls. Denies any facial weakness. Speech back to normal. Headaches are infrequent. Reports was given medications from Neurology for headaches and has not had to use any as of present. Ready to return to work. Employed at [employer] 40 hours per week (8 hour shifts).  2. DIABETES TYPE 2 FOLLOW-UP: 09/03/2020: - Hemoglobin A1c not at goal today at 10.7%, goal < 7%. This is similar to previous hemoglobin A1c of 11.1% on 07/06/2020. Next hemoglobin A1c due July 2022.  - Patient not ready to try course of Lantus as of yet. He does not like needles.  -  Increase Metformin from 500 mg twice daily to 1000 mg twice daily.  - Begin Dapagliflozin as prescribed.    11/29/2020 at [facility] per clinical pharmacist note: Diabetes longstanding. A1c today reveals  improvement. A1c is 7.4% (down from 10.7%). Patient is able to verbalize appropriate hypoglycemia management plan. Medication adherence appears optimal. Commended patient on improvement. No changes for now. Will send message to pt's PCP regarding management of neuropathy.  -Continue Trulicity to 4.0JW once weekly.  -Continue Metformin 1083m BID (instructed to take as prescribed) -Farxiga 517monce daily  -Extensively discussed pathophysiology of diabetes, recommended lifestyle interventions, dietary effects on blood sugar control -Counseled on s/sx of and management of hypoglycemia -Next A1C anticipated 02/2021.    ASCVD risk - primary prevention in patient with diabetes. Last LDL is not controlled. ASCVD risk score is not >20%. We increased his atorvastatin at his last visit and will continue high intensity statin.   -Continue Atorvastatin 40 mg.   02/26/2021: Doing well on current regimen. Home blood sugars 120's-140's with occasional 160. Monitoring what he eats. No additional physical activity outside of home.  Patient Active Problem List   Diagnosis Date Noted   Diabetic neuropathy associated with type 2 diabetes mellitus ([place]/22/2022   Numbness and tingling in left hand 11/22/2020   Hearing loss of right ear due to cerumen impaction 08/10/2020   Hearing loss of left ear 08/10/2020   Balance problem 08/10/2020   Type 2 diabetes mellitus ([place]/05/2020   Hyperlipidemia 07/10/2020   Bell's palsy 07/07/2020   Elevated blood-pressure reading without diagnosis of hypertension 07/07/2020     Current Outpatient Medications on File Prior to Visit  Medication Sig Dispense Refill   amitriptyline (ELAVIL) 25 MG tablet Take 1 tablet (25 mg total) by mouth at bedtime. 30 tablet 6   atorvastatin (LIPITOR) 40 MG tablet Take 1 tablet (40 mg total) by mouth daily. 30 tablet 2   Blood Glucose Monitoring Suppl (TRUE METRIX METER) w/Device KIT Use as directed 1 kit 0   carbamide peroxide  (DEBROX) 6.5 % OTIC solution Place 5 drops into the right ear 2 (two) times daily. 15 mL 2   clopidogrel (PLAVIX) 75 MG tablet Take 1 tablet (75 mg total) by mouth daily. 30 tablet 11   glucose blood (TRUE METRIX BLOOD GLUCOSE TEST) test strip Use as instructed 100 each 12   naproxen sodium (ALEVE) 220 MG tablet Take 220 mg by mouth.     rizatriptan (MAXALT-MLT) 10 MG disintegrating tablet Take 1 tablet (10 mg total) by mouth as needed for migraine. May repeat in 2 hours if needed 9 tablet 11   TRUEplus Lancets 28G MISC USE AS DIRECTED 100 each 4   No current facility-administered medications on file prior to visit.    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 program  Occupational History    Comment: EMT  Tobacco Use   Smoking status: Never    Passive exposure: Yes   Smokeless tobacco: Never  Substance and Sexual Activity   Alcohol use: Yes    Comment: occ   Drug use: Not Currently   Sexual activity: Not Currently  Other Topics  Concern   Not on file  Social History Narrative   Lives alone   Social Determinants of Health   Financial Resource Strain: Not on file  Food Insecurity: Not on file  Transportation Needs: Not on file  Physical Activity: Not on file  Stress: Not on file  Social Connections: Not on file  Intimate Partner Violence: Not on file    Family History  Problem Relation Age of Onset   Cancer Mother    Alzheimer's disease Father     Past Surgical History:  Procedure Laterality Date   KNEE RECONSTRUCTION Left     ROS: Review of Systems Negative except as stated above  PHYSICAL EXAM: BP 129/87 (BP Location: Left Arm, Patient Position: Sitting, Cuff Size: Normal)    Pulse 89   Temp 98 F (36.7 C) (Oral)   Resp 16   Wt 164 lb (74.4 kg)   SpO2 96%   BMI 23.53 kg/m   Physical Exam HENT:     Head: Normocephalic and atraumatic.     Right Ear: Tympanic membrane, ear canal and external ear normal.     Left Ear: Tympanic membrane, ear canal and external ear normal.     Nose: Nose normal.     Mouth/Throat:     Mouth: Mucous membranes are moist.     Pharynx: Oropharynx is clear.  Eyes:     Extraocular Movements: Extraocular movements intact.     Conjunctiva/sclera: Conjunctivae normal.     Pupils: Pupils are equal, round, and reactive to light.  Cardiovascular:     Rate and Rhythm: Normal rate and regular rhythm.     Pulses: Normal pulses.     Heart sounds: Normal heart sounds.  Pulmonary:     Effort: Pulmonary effort is normal.     Breath sounds: Normal breath sounds.  Musculoskeletal:        General: Normal range of motion.     Cervical back: Normal range of motion and neck supple.     Comments: Left upper extremity 4/5. Right upper extremity 5/5.  Neurological:     General: No focal deficit present.     Mental Status: He is alert and oriented to person, place, and time.  Psychiatric:        Mood and Affect: Mood normal.        Behavior: Behavior normal.   Results for orders placed or performed in visit on 02/26/21  POCT glycosylated hemoglobin (Hb A1C)  Result Value Ref Range   Hemoglobin A1C     HbA1c POC (<> result, manual entry)     HbA1c, POC (prediabetic range)     HbA1c, POC (controlled diabetic range) 7.0 0.0 - 7.0 %    ASSESSMENT AND PLAN: 1. Left-sided Bell's palsy: 2. Left-sided weakness: 3. Numbness and tingling in left hand: - Cleared to return to work at most recent Neurology appointment on 02/18/2021. - Patient provided with note to return to work with restrictions of lifting less than 10 pounds, wearing cut-resistant kitchen gloves when appropriate, and having uninterrupted breaks and lunch during his scheduled  shift.  - Follow-up with primary provider in 4 weeks or sooner if needed.   4. Type 2 diabetes mellitus without complication, without long-term current use of insulin ([place]): - Hemoglobin A1c today at goal at 7.0%. This is improved from previous of 7.4% on 11/29/2020. Next due January 2023.  - Continue Dulaglutide, Dapagliflozin Propanediol, and Metformin as prescribed.  - Discussed the importance of healthy eating habits, low-carbohydrate  diet, low-sugar diet, regular aerobic exercise (at least 150 minutes a week as tolerated) and medication compliance to achieve or maintain control of diabetes. - To achieve an A1C goal of less than or equal to 7.0 percent, a fasting blood sugar of 80 to 130 mg/dL and a postprandial glucose (90 to 120 minutes after a meal) less than 180 mg/dL. In the event of sugars less than 60 mg/dl or greater than 400 mg/dl please notify the clinic ASAP. It is recommended that you undergo annual eye exams and annual foot exams. - Follow-up with primary provider in 3 months or sooner if needed.  - POCT glycosylated hemoglobin (Hb A1C) - Dulaglutide (TRULICITY) 1.5 WL/8.9HT SOPN; Inject 1.5 mg into the skin once a week.  Dispense: 6 mL; Refill: 0 - dapagliflozin propanediol (FARXIGA) 5 MG TABS tablet; Take 1 tablet (5 mg total) by mouth daily before breakfast.  Dispense: 90 tablet; Refill: 0 - metFORMIN (GLUCOPHAGE) 1000 MG tablet; Take 1 tablet (1,000 mg total) by mouth 2 (two) times daily with a meal.  Dispense: 180 tablet; Refill: 0 - Insulin Pen Needle 31G X 8 MM MISC; Use as directed  Dispense: 100 each; Refill: 0  5. Hyperlipidemia, unspecified hyperlipidemia type: -Practice low-fat heart healthy diet and at least 150 minutes of moderate intensity exercise weekly as tolerated.  - Continue Atorvastatin as prescribed.  - Lipid panel to screen level of cholesterol control.  - Follow-up with primary provider as scheduled. - Lipid Panel    Patient was given the opportunity  to ask questions.  Patient verbalized understanding of the plan and was able to repeat key elements of the plan. Patient was given clear instructions to go to Emergency Department or return to medical center if symptoms don't improve, worsen, or new problems develop.The patient verbalized understanding.   Orders Placed This Encounter  Procedures   Lipid Panel   POCT glycosylated hemoglobin (Hb A1C)    Requested Prescriptions   Signed Prescriptions Disp Refills   Dulaglutide (TRULICITY) 1.5 DS/2.8JG SOPN 6 mL 0    Sig: Inject 1.5 mg into the skin once a week.   dapagliflozin propanediol (FARXIGA) 5 MG TABS tablet 90 tablet 0    Sig: Take 1 tablet (5 mg total) by mouth daily before breakfast.   metFORMIN (GLUCOPHAGE) 1000 MG tablet 180 tablet 0    Sig: Take 1 tablet (1,000 mg total) by mouth 2 (two) times daily with a meal.   Insulin Pen Needle 31G X 8 MM MISC 100 each 0    Sig: Use as directed    Return in about 3 months (around 05/29/2021) for Follow-Up or next available diabetes and 4 weeks return to work follow-up telemedicine.  Camillia Herter, NP

## 2021-02-26 ENCOUNTER — Other Ambulatory Visit: Payer: Self-pay

## 2021-02-26 ENCOUNTER — Encounter: Payer: Self-pay | Admitting: Family

## 2021-02-26 ENCOUNTER — Ambulatory Visit (INDEPENDENT_AMBULATORY_CARE_PROVIDER_SITE_OTHER): Payer: Self-pay | Admitting: Family

## 2021-02-26 VITALS — BP 129/87 | HR 89 | Temp 98.0°F | Resp 16 | Wt 164.0 lb

## 2021-02-26 DIAGNOSIS — R200 Anesthesia of skin: Secondary | ICD-10-CM

## 2021-02-26 DIAGNOSIS — E785 Hyperlipidemia, unspecified: Secondary | ICD-10-CM

## 2021-02-26 DIAGNOSIS — G510 Bell's palsy: Secondary | ICD-10-CM

## 2021-02-26 DIAGNOSIS — R531 Weakness: Secondary | ICD-10-CM

## 2021-02-26 DIAGNOSIS — E119 Type 2 diabetes mellitus without complications: Secondary | ICD-10-CM

## 2021-02-26 DIAGNOSIS — R202 Paresthesia of skin: Secondary | ICD-10-CM

## 2021-02-26 LAB — POCT GLYCOSYLATED HEMOGLOBIN (HGB A1C): HbA1c, POC (controlled diabetic range): 7 % (ref 0.0–7.0)

## 2021-02-26 MED ORDER — INSULIN PEN NEEDLE 31G X 8 MM MISC
0 refills | Status: DC
  Filled 2021-02-26: qty 100, 25d supply, fill #0

## 2021-02-26 MED ORDER — PEN NEEDLES 31G X 8 MM MISC
0 refills | Status: DC
  Filled 2021-02-26: qty 90, fill #0

## 2021-02-26 MED ORDER — DAPAGLIFLOZIN PROPANEDIOL 5 MG PO TABS
5.0000 mg | ORAL_TABLET | Freq: Every day | ORAL | 0 refills | Status: AC
  Filled 2021-02-26: qty 90, 90d supply, fill #0
  Filled 2021-04-08: qty 30, 30d supply, fill #0

## 2021-02-26 MED ORDER — TRULICITY 1.5 MG/0.5ML ~~LOC~~ SOAJ
1.5000 mg | SUBCUTANEOUS | 0 refills | Status: DC
  Filled 2021-02-26: qty 6, 84d supply, fill #0
  Filled 2021-03-20 – 2021-03-29 (×3): qty 2, 28d supply, fill #0
  Filled 2021-04-10 – 2021-05-20 (×4): qty 2, 28d supply, fill #1
  Filled 2021-05-21 – 2021-05-30 (×2): qty 2, 28d supply, fill #0

## 2021-02-26 MED ORDER — METFORMIN HCL 1000 MG PO TABS
1000.0000 mg | ORAL_TABLET | Freq: Two times a day (BID) | ORAL | 0 refills | Status: DC
  Filled 2021-02-26: qty 180, 90d supply, fill #0
  Filled 2021-04-08: qty 60, 30d supply, fill #0

## 2021-02-26 NOTE — Patient Instructions (Signed)
Diabetic Neuropathy Diabetic neuropathy refers to nerve damage that is caused by diabetes. Over time, people with diabetes can develop nerve damage throughout the body. There are several types of diabetic neuropathy: Peripheral neuropathy. This is the most common type of diabetic neuropathy. It damages the nerves that carry signals between the spinal cord and other parts of the body (peripheral nerves). This usually affects nerves in the feet, legs, hands, and arms. Autonomic neuropathy. This type causes damage to nerves that control involuntary functions (autonomic nerves). Involuntary functions are functions of the body that you do not control. They include heartbeat, body temperature, blood pressure, urination, digestion, sweating, sexual function, or response to changes in blood glucose. Focal neuropathy. This type of nerve damage affects one area of the body, such as an arm, a leg, or the face. The injury may involve one nerve or a small group of nerves. Focal neuropathy can be painful and unpredictable. It occurs most often in older adults with diabetes. This often develops suddenly, but usually improves over time and does not cause long-term problems. Proximal neuropathy. This type of nerve damage affects the nerves of the thighs, hips, buttocks, or legs. It causes severe pain, weakness, and muscle death (atrophy), usually in the thigh muscles. It is more common among older men and people who have type 2 diabetes. The length of recovery time may vary. What are the causes? Peripheral, autonomic, and focal neuropathies are caused by diabetes that is not well controlled with treatment. The cause of proximal neuropathy is not known, but it may be caused by inflammation related to uncontrolled blood glucose levels. What are the signs or symptoms? Peripheral neuropathy Peripheral neuropathy develops slowly over time. When the nerves of the feet and legs no longer work, you may experience: Burning,  stabbing, or aching pain in the legs or feet. Pain or cramping in the legs or feet. Loss of feeling (numbness) and inability to feel pressure or pain in the feet. This can lead to: Thick calluses or sores on areas of constant pressure. Ulcers. Reduced ability to feel temperature changes. Foot deformities. Muscle weakness. Loss of balance or coordination. Autonomic neuropathy The symptoms of autonomic neuropathy vary depending on which nerves are affected. Symptoms may include: Problems with digestion, such as: Nausea or vomiting. Poor appetite. Bloating. Diarrhea or constipation. Trouble swallowing. Losing weight without trying to. Problems with the heart, blood, and lungs, such as: Dizziness, especially when standing up. Fainting. Shortness of breath. Irregular heartbeat. Bladder problems, such as: Trouble starting or stopping urination. Leaking urine. Trouble emptying the bladder. Urinary tract infections (UTIs). Problems with other body functions, such as: Sweat. You may sweat too much or too little. Temperature. You might get hot easily. Or, you might feel cold more than usual. Sexual function. Men may not be able to get or maintain an erection. Women may have vaginal dryness and difficulty with arousal. Focal neuropathy Symptoms affect only one area of the body. Common symptoms include: Numbness. Tingling. Burning pain. Prickling feeling. Very sensitive skin. Weakness. Inability to move (paralysis). Muscle twitching. Muscles getting smaller (wasting). Poor coordination. Double or blurred vision. Proximal neuropathy Sudden, severe pain in the hip, thigh, or buttocks. Pain may spread from the back into the legs (sciatica). Pain and numbness in the arms and legs. Tingling. Loss of bladder control or bowel control. Weakness and wasting of thigh muscles. Difficulty getting up from a seated position. Abdominal swelling. Unexplained weight loss. How is this  diagnosed? Diagnosis varies depending on the type   of neuropathy your health care provider suspects. Peripheral neuropathy Your health care provider will do a neurologic exam. This exam checks your reflexes, how you move, and what you can feel. You may have other tests, such as: Blood tests. Tests of the fluid that surrounds the spinal cord (lumbar puncture). CT scan. MRI. Checking the nerves that control muscles (electromyogram, or EMG). Checking how quickly signals pass through your nerves (nerve conduction study). Checking a small piece of a nerve using a microscope (biopsy). Autonomic neuropathy You may have tests, such as: Tests to measure your blood pressure and heart rate. You may be secured to an exam table that moves you from a lying position to an upright position (table tilt test). Breathing tests to check your lungs. Tests to check how food moves through the digestive system (gastric emptying tests). Blood, sweat, or urine tests. Ultrasound of your bladder. Spinal fluid tests. Focal neuropathy This condition may be diagnosed with: A neurologic exam. CT scan. MRI. EMG. Nerve conduction study. Proximal neuropathy There is no test to diagnose this type of neuropathy. You may have tests to rule out other possible causes of this type of neuropathy. Tests may include: X-rays of your spine and lumbar region. Lumbar puncture. MRI. How is this treated? The goal of treatment is to keep nerve damage from getting worse. Treatment may include: Following your diabetes management plan. This will help keep your blood glucose level and your A1C level within your target range. This is the most important treatment. Using prescription pain medicine. Follow these instructions at home: Diabetes management Follow your diabetes management plan as told by your health care provider. Check your blood glucose levels. Keep your blood glucose in your target range. Have your A1C level checked at  least two times a year, or as often as told. Take over the counter and prescription medicines only as told by your health care provider. This includes insulin and diabetes medicine.  Lifestyle  Do not use any products that contain nicotine or tobacco, such as cigarettes, e-cigarettes, and chewing tobacco. If you need help quitting, ask your health care provider. Be physically active every day. Include strength training and balance exercises. Follow a healthy meal plan. Work with your health care provider to manage your blood pressure. General instructions Ask your health care provider if the medicine prescribed to you requires you to avoid driving or using machinery. Check your skin and feet every day for cuts, bruises, redness, blisters, or sores. Keep all follow-up visits. This is important. Contact a health care provider if: You have burning, stabbing, or aching pain in your legs or feet. You are unable to feel pressure or pain in your feet. You develop problems with digestion, such as: Nausea. Vomiting. Bloating. Constipation. Diarrhea. Abdominal pain. You have difficulty with urination, such as: Inability to control when you urinate (incontinence). Inability to completely empty the bladder (retention). You feel as if your heart is racing (palpitations). You feel dizzy, weak, or faint when you stand up. Get help right away if: You cannot urinate. You have sudden weakness or loss of coordination. You have trouble speaking. You have pain or pressure in your chest. You have an irregular heartbeat. You have sudden inability to move a part of your body. These symptoms may represent a serious problem that is an emergency. Do not wait to see if the symptoms will go away. Get medical help right away. Call your local emergency services (911 in the [place] Do not drive yourself to   the hospital. Summary Diabetic neuropathy is nerve damage that is caused by diabetes. It can cause numbness  and pain in the arms, legs, digestive tract, heart, and other body systems. This condition is treated by keeping your blood glucose level and your A1C level within your target range. This can help prevent neuropathy from getting worse. Check your skin and feet every day for cuts, bruises, redness, blisters, or sores. Do not use any products that contain nicotine or tobacco, such as cigarettes, e-cigarettes, and chewing tobacco. This information is not intended to replace advice given to you by your health care provider. Make sure you discuss any questions you have with your health care provider. Document Revised: 09/08/2019 Document Reviewed: 09/08/2019 Elsevier Patient Education  2022 [employer]  

## 2021-02-27 ENCOUNTER — Other Ambulatory Visit: Payer: Self-pay

## 2021-02-27 LAB — LIPID PANEL
Chol/HDL Ratio: 2.8 ratio (ref 0.0–5.0)
Cholesterol, Total: 125 mg/dL (ref 100–199)
HDL: 45 mg/dL (ref >39)
LDL Chol Calc (NIH): 64 mg/dL (ref 0–99)
Triglycerides: 81 mg/dL (ref 0–149)
VLDL Cholesterol Cal: 16 mg/dL (ref 5–40)

## 2021-02-27 NOTE — Progress Notes (Signed)
Cholesterol improved since 3 months ago. Continue Atorvastatin (Lipitor) for cholesterol maintenance.   Diabetes discussed in office.

## 2021-03-05 ENCOUNTER — Other Ambulatory Visit: Payer: Self-pay

## 2021-03-06 ENCOUNTER — Other Ambulatory Visit: Payer: Self-pay

## 2021-03-11 ENCOUNTER — Other Ambulatory Visit: Payer: Self-pay

## 2021-03-14 ENCOUNTER — Encounter: Payer: Self-pay | Admitting: *Deleted

## 2021-03-20 ENCOUNTER — Other Ambulatory Visit: Payer: Self-pay

## 2021-03-22 NOTE — Progress Notes (Signed)
Virtual Visit via Telephone Note  I connected with Matthew Jennings, on 03/26/2021 at 8:55 AM by telephone due to the COVID-19 pandemic and verified that I am speaking with the correct person using two identifiers.  Due to current restrictions/limitations of in-office visits due to the COVID-19 pandemic, this scheduled clinical appointment was converted to a telehealth visit.   Consent: I discussed the limitations, risks, security and privacy concerns of performing an evaluation and management service by telephone and the availability of in person appointments. I also discussed with the patient that there may be a patient responsible charge related to this service. The patient expressed understanding and agreed to proceed.   Location of Patient: Home  Location of Provider: [facility] [place] participating in Telemedicine visit: [place], NP Elmon Else, CMA   History of Present Illness: Matthew Jennings is a 55 year-old male who presents for return to work follow-up.  RETURN TO WORK FOLLOW-UP: 02/26/2021: Left-sided Bell's palsy: Left-sided weakness: Numbness and tingling in left hand: - Cleared to return to work at most recent Neurology appointment on 02/18/2021. - Patient provided with note to return to work with restrictions of lifting less than 10 pounds, wearing cut-resistant kitchen gloves when appropriate, and having uninterrupted breaks and lunch during his scheduled shift.  - Follow-up with primary provider in 4 weeks or sooner if needed.   03/26/2021: Reports has not returned to work since last appointment. Reports his job at [employer] was filled by the owner therefore he was unable to return to work. Currently cannot find employment. Still having occasional dizziness. Feet tingling persisting. Numbness in hands resolved. Has questions about Plavix, next appointment with Neurology February 2023.     Past  Medical History:  Diagnosis Date   Bell's palsy    left   Diabetes mellitus without complication (HCC)    Facial paralysis/Bells palsy    Hx of migraines    Allergies  Allergen Reactions   Aspirin Other (See Comments)   Penicillins Rash    Current Outpatient Medications on File Prior to Visit  Medication Sig Dispense Refill   amitriptyline (ELAVIL) 25 MG tablet Take 1 tablet (25 mg total) by mouth at bedtime. 30 tablet 6   atorvastatin (LIPITOR) 40 MG tablet Take 1 tablet (40 mg total) by mouth daily. 30 tablet 2   Blood Glucose Monitoring Suppl (TRUE METRIX METER) w/Device KIT Use as directed 1 kit 0   carbamide peroxide (DEBROX) 6.5 % OTIC solution Place 5 drops into the right ear 2 (two) times daily. 15 mL 2   clopidogrel (PLAVIX) 75 MG tablet Take 1 tablet (75 mg total) by mouth daily. 30 tablet 11   dapagliflozin propanediol (FARXIGA) 5 MG TABS tablet Take 1 tablet (5 mg total) by mouth daily before breakfast. 90 tablet 0   Dulaglutide (TRULICITY) 1.5 KK/9.3GH SOPN Inject 1.5 mg into the skin once a week. 6 mL 0   glucose blood (TRUE METRIX BLOOD GLUCOSE TEST) test strip Use as instructed 100 each 12   Insulin Pen Needle 31G X 8 MM MISC Use as directed 100 each 0   metFORMIN (GLUCOPHAGE) 1000 MG tablet Take 1 tablet (1,000 mg total) by mouth 2 (two) times daily with a meal. 180 tablet 0   naproxen sodium (ALEVE) 220 MG tablet Take 220 mg by mouth.     rizatriptan (MAXALT-MLT) 10 MG disintegrating tablet Take 1 tablet (10 mg total) by mouth  as needed for migraine. May repeat in 2 hours if needed 9 tablet 11   TRUEplus Lancets 28G MISC USE AS DIRECTED 100 each 4   No current facility-administered medications on file prior to visit.    Observations/Objective: Alert and oriented x 3. Not in acute distress. Physical examination not completed as this is a telemedicine visit.  Assessment and Plan: 1. Left-sided Bell's palsy: 2. Left-sided weakness: 3. Numbness and tingling in  left hand: - Today's appointment in regards to follow-up of patient returning to work since last appointment with me on 02/26/2021. Prior to that patient cleared per Neurology to return to work on 02/18/2021. - Patient reports has not returned to work since last appointment. Reports his job at [employer] was filled by the owner therefore he was unable to return to work. Currently cannot find employment. Still having occasional dizziness. Feet tingling persisting. Numbness in hands resolved. Has questions about Plavix, next appointment with Neurology February 2023.  - Counseled to follow-up with Neurology for advisement of Plavix management.  - Counseled to follow-up with primary provider as scheduled.    Follow Up Instructions: Follow-up with primary provider as scheduled.    Patient was given clear instructions to go to Emergency Department or return to medical center if symptoms don't improve, worsen, or new problems develop.The patient verbalized understanding.  I discussed the assessment and treatment plan with the patient. The patient was provided an opportunity to ask questions and all were answered. The patient agreed with the plan and demonstrated an understanding of the instructions.   The patient was advised to call back or seek an in-person evaluation if the symptoms worsen or if the condition fails to improve as anticipated.    I provided 5 minutes total of non-face-to-face time during this encounter.   Camillia Herter, NP  [facility] [place], [place] 03/26/2021, 8:55 AM

## 2021-03-26 ENCOUNTER — Telehealth (INDEPENDENT_AMBULATORY_CARE_PROVIDER_SITE_OTHER): Payer: Self-pay | Admitting: Family

## 2021-03-26 ENCOUNTER — Encounter: Payer: Self-pay | Admitting: Family

## 2021-03-26 ENCOUNTER — Other Ambulatory Visit: Payer: Self-pay

## 2021-03-26 DIAGNOSIS — R531 Weakness: Secondary | ICD-10-CM

## 2021-03-26 DIAGNOSIS — R202 Paresthesia of skin: Secondary | ICD-10-CM

## 2021-03-26 DIAGNOSIS — R200 Anesthesia of skin: Secondary | ICD-10-CM

## 2021-03-26 DIAGNOSIS — G510 Bell's palsy: Secondary | ICD-10-CM

## 2021-03-26 NOTE — Progress Notes (Signed)
Pt presents for telemedicine visit to follow-up, pt states he has not returned back to work, states no one has hired him back

## 2021-03-27 ENCOUNTER — Other Ambulatory Visit: Payer: Self-pay

## 2021-03-29 ENCOUNTER — Other Ambulatory Visit: Payer: Self-pay

## 2021-04-08 ENCOUNTER — Other Ambulatory Visit: Payer: Self-pay

## 2021-04-10 ENCOUNTER — Other Ambulatory Visit: Payer: Self-pay

## 2021-04-22 ENCOUNTER — Other Ambulatory Visit: Payer: Self-pay

## 2021-04-29 ENCOUNTER — Other Ambulatory Visit: Payer: Self-pay

## 2021-05-15 ENCOUNTER — Other Ambulatory Visit (HOSPITAL_COMMUNITY): Payer: Self-pay

## 2021-05-21 ENCOUNTER — Other Ambulatory Visit: Payer: Self-pay

## 2021-05-26 NOTE — Progress Notes (Signed)
Patient ID: Matthew Jennings, male    DOB: [birth year]  MRN: [id number]  CC: Diabetes Follow-Up  Subjective: Matthew Jennings is a 56 y.o. male who presents for diabetes follow-up.  His concerns today include:   DIABETES TYPE 2 FOLLOW-UP: 02/26/2021: - Continue Dulaglutide, Dapagliflozin Propanediol, and Metformin as prescribed.  05/30/2021: Reports hasn't been able to get Trulicity for a few weeks due to [facility] relocating. Still taking Metformin and Farxiga. Reports concern for cost of [facility] [place], currently having to pay $10. Reports financial challenge because has to pay for medication and pay for gas to get to [facility]. Reports signed up for Mail My Meds without success. Was told that he has [employer] and reports this is false. States he only has [employer]. Tried to call [employer] and was unable to talk to live person.  2. HYPERLIPIDEMIA FOLLOW-UP: Doing well on current Atorvastatin.   Patient Active Problem List   Diagnosis Date Noted   Diabetic neuropathy associated with type 2 diabetes mellitus ([place]) 11/30/2020   Numbness and tingling in left hand 11/22/2020   Hearing loss of right ear due to cerumen impaction 08/10/2020   Hearing loss of left ear 08/10/2020   Balance problem 08/10/2020   Type 2 diabetes mellitus ([place]) 07/10/2020   Hyperlipidemia 07/10/2020   Bell's palsy 07/07/2020   Elevated blood-pressure reading without diagnosis of hypertension 07/07/2020     Current Outpatient Medications on File Prior to Visit  Medication Sig Dispense Refill   amitriptyline (ELAVIL) 25 MG tablet Take 1 tablet (25 mg total) by mouth at bedtime. 30 tablet 6   atorvastatin (LIPITOR) 40 MG tablet Take 1 tablet (40 mg total) by mouth daily. 30 tablet 2   Blood Glucose Monitoring Suppl (TRUE METRIX METER) w/Device KIT Use as directed 1 kit 0   carbamide peroxide (DEBROX) 6.5 % OTIC solution Place 5 drops  into the right ear 2 (two) times daily. 15 mL 2   clopidogrel (PLAVIX) 75 MG tablet Take 1 tablet (75 mg total) by mouth daily. 30 tablet 11   glucose blood (TRUE METRIX BLOOD GLUCOSE TEST) test strip Use as instructed 100 each 12   Insulin Pen Needle 31G X 8 MM MISC Use as directed 100 each 0   metFORMIN (GLUCOPHAGE) 1000 MG tablet Take 1 tablet (1,000 mg total) by mouth 2 (two) times daily with a meal. 180 tablet 0   naproxen sodium (ALEVE) 220 MG tablet Take 220 mg by mouth.     rizatriptan (MAXALT-MLT) 10 MG disintegrating tablet Take 1 tablet (10 mg total) by mouth as needed for migraine. May repeat in 2 hours if needed 9 tablet 11   TRUEplus Lancets 28G MISC USE AS DIRECTED 100 each 4   No current facility-administered medications on file prior to visit.    Allergies  Allergen Reactions   Aspirin Other (See Comments)   Penicillins Rash    Social History   Socioeconomic History   Marital status: Single    Spouse name: Not on file   Number of children: 0   Years of education: Not on file   Highest education level: Associate degree: occupational, Hotel manager, or vocational program  Occupational History    Comment: EMT  Tobacco Use   Smoking status: Never    Passive exposure: Yes   Smokeless tobacco: Never  Substance and Sexual Activity   Alcohol use: Yes    Comment: occ   Drug use: Not  Currently   Sexual activity: Not Currently  Other Topics Concern   Not on file  Social History Narrative   Lives alone   Social Determinants of Health   Financial Resource Strain: Not on file  Food Insecurity: Not on file  Transportation Needs: Not on file  Physical Activity: Not on file  Stress: Not on file  Social Connections: Not on file  Intimate Partner Violence: Not on file    Family History  Problem Relation Age of Onset   Cancer Mother    Alzheimer's disease Father     Past Surgical History:  Procedure Laterality Date   KNEE RECONSTRUCTION Left     ROS: Review  of Systems Negative except as stated above  PHYSICAL EXAM: BP 119/90 (BP Location: Left Arm, Patient Position: Sitting, Cuff Size: Normal)    Pulse 90    Temp 98.5 F (36.9 C)    Resp 18    Ht _0  (1.778 m)    Wt 164 lb 3.2 oz (74.5 kg)    SpO2 94%    BMI 23.56 kg/m   Physical Exam HENT:     Head: Normocephalic and atraumatic.  Eyes:     Extraocular Movements: Extraocular movements intact.     Conjunctiva/sclera: Conjunctivae normal.     Pupils: Pupils are equal, round, and reactive to light.  Cardiovascular:     Rate and Rhythm: Normal rate and regular rhythm.     Pulses: Normal pulses.     Heart sounds: Normal heart sounds.  Pulmonary:     Effort: Pulmonary effort is normal.     Breath sounds: Normal breath sounds.  Musculoskeletal:     Cervical back: Normal range of motion and neck supple.  Neurological:     General: No focal deficit present.     Mental Status: He is alert and oriented to person, place, and time.  Psychiatric:        Mood and Affect: Mood normal.        Behavior: Behavior normal.   Results for orders placed or performed in visit on 05/30/21  POCT glycosylated hemoglobin (Hb A1C)  Result Value Ref Range   Hemoglobin A1C 9.0 (A) 4.0 - 5.6 %   HbA1c POC (<> result, manual entry)     HbA1c, POC (prediabetic range)     HbA1c, POC (controlled diabetic range)       ASSESSMENT AND PLAN: 1. Type 2 diabetes mellitus without complication, without long-term current use of insulin ([place]): - Hemoglobin A1c not at goal at 9.0%, goal < 7%.  - Increase Dapagliflozin Propanediol from 5 mg daily to 10 mg daily.  - Increase Dulaglutide from 1.5 mg weekly to 3 mg weekly.  - Continue Metformin as prescribed.  - Counseled patient I will consult with Matthew Jennings, RPH-CPP regarding financial assistance of prescriptions and patient's request to have medications mailed to his home.  - Discussed the importance of healthy eating habits, low-carbohydrate diet, low-sugar  diet, regular aerobic exercise (at least 150 minutes a week as tolerated) and medication compliance to achieve or maintain control of diabetes. - Update BMP.  - Follow-up with clinical pharmacist in 4 weeks for diabetes checkup. Write your home blood sugar results down each day and bring those results to your appointment along with your home glucose monitor. Medications may be revised at that time if needed. - Follow-up with primary provider as scheduled.  - POCT glycosylated hemoglobin (Hb Q3E) - Basic Metabolic Panel - Dulaglutide 3 MG/0.5ML  SOPN; Inject 3 mg into the skin once a week.  Dispense: 2 mL; Refill: 0 - Insulin Pen Needle 31G X 8 MM MISC; Use as directed  Dispense: 100 each; Refill: 0 - metFORMIN (GLUCOPHAGE) 1000 MG tablet; Take 1 tablet (1,000 mg total) by mouth 2 (two) times daily with a meal.  Dispense: 180 tablet; Refill: 0 - dapagliflozin propanediol (FARXIGA) 10 MG TABS tablet; Take 1 tablet (10 mg total) by mouth daily before breakfast.  Dispense: 90 tablet; Refill: 0  2. Hyperlipidemia, unspecified hyperlipidemia type: - Practice low-fat heart healthy diet and at least 150 minutes of moderate intensity exercise weekly as tolerated.  - Continue Atorvastatin as prescribed.  - Follow-up with primary provider as scheduled.  - atorvastatin (LIPITOR) 40 MG tablet; Take 1 tablet (40 mg total) by mouth daily.  Dispense: 120 tablet; Refill: 0   Patient was given the opportunity to ask questions.  Patient verbalized understanding of the plan and was able to repeat key elements of the plan. Patient was given clear instructions to go to Emergency Department or return to medical center if symptoms don't improve, worsen, or new problems develop.The patient verbalized understanding.   Orders Placed This Encounter  Procedures   Basic Metabolic Panel   POCT glycosylated hemoglobin (Hb A1C)     Requested Prescriptions   Pending Prescriptions Disp Refills   Dulaglutide 3 MG/0.5ML  SOPN 2 mL 0    Sig: Inject 3 mg into the skin once a week.   Insulin Pen Needle (PEN NEEDLES) 31G X 8 MM MISC 100 each 6    Sig: UAD   Insulin Pen Needle 31G X 8 MM MISC 100 each 0    Sig: Use as directed    Return in about 4 weeks (around 06/27/2021) for Follow-Up or next available diabetes with Marylin Crosby, CPP .  Camillia Herter, NP

## 2021-05-27 ENCOUNTER — Other Ambulatory Visit: Payer: Self-pay

## 2021-05-30 ENCOUNTER — Other Ambulatory Visit: Payer: Self-pay

## 2021-05-30 ENCOUNTER — Ambulatory Visit (INDEPENDENT_AMBULATORY_CARE_PROVIDER_SITE_OTHER): Payer: Self-pay | Admitting: Family

## 2021-05-30 ENCOUNTER — Encounter: Payer: Self-pay | Admitting: Family

## 2021-05-30 VITALS — BP 119/90 | HR 90 | Temp 98.5°F | Resp 18 | Ht 70.0 in | Wt 164.2 lb

## 2021-05-30 DIAGNOSIS — E785 Hyperlipidemia, unspecified: Secondary | ICD-10-CM

## 2021-05-30 DIAGNOSIS — E119 Type 2 diabetes mellitus without complications: Secondary | ICD-10-CM

## 2021-05-30 LAB — POCT GLYCOSYLATED HEMOGLOBIN (HGB A1C): Hemoglobin A1C: 9 % — AB (ref 4.0–5.6)

## 2021-05-30 MED ORDER — INSULIN PEN NEEDLE 31G X 8 MM MISC
0 refills | Status: DC
Start: 2021-05-30 — End: 2022-08-18
  Filled 2021-05-30 – 2022-02-13 (×2): qty 100, 25d supply, fill #0

## 2021-05-30 MED ORDER — METFORMIN HCL 1000 MG PO TABS
1000.0000 mg | ORAL_TABLET | Freq: Two times a day (BID) | ORAL | 0 refills | Status: DC
  Filled 2021-05-30: qty 60, 30d supply, fill #0

## 2021-05-30 MED ORDER — DULAGLUTIDE 3 MG/0.5ML ~~LOC~~ SOAJ
3.0000 mg | SUBCUTANEOUS | 0 refills | Status: DC
  Filled 2021-05-30 – 2021-06-10 (×2): qty 2, 28d supply, fill #0

## 2021-05-30 MED ORDER — DAPAGLIFLOZIN PROPANEDIOL 10 MG PO TABS
10.0000 mg | ORAL_TABLET | Freq: Every day | ORAL | 0 refills | Status: DC
Start: 2021-05-30 — End: 2021-06-10
  Filled 2021-05-30: qty 30, 30d supply, fill #0

## 2021-05-30 MED ORDER — ATORVASTATIN CALCIUM 40 MG PO TABS
40.0000 mg | ORAL_TABLET | Freq: Every day | ORAL | 0 refills | Status: DC
  Filled 2021-05-30 – 2021-06-10 (×2): qty 30, 30d supply, fill #0

## 2021-05-30 NOTE — Progress Notes (Signed)
Pt presents for diabetes follow-up  

## 2021-05-30 NOTE — Progress Notes (Signed)
Diabetes dicussed in office.

## 2021-05-31 ENCOUNTER — Other Ambulatory Visit: Payer: Self-pay

## 2021-05-31 ENCOUNTER — Other Ambulatory Visit: Payer: Self-pay | Admitting: Family

## 2021-05-31 DIAGNOSIS — E119 Type 2 diabetes mellitus without complications: Secondary | ICD-10-CM

## 2021-05-31 LAB — BASIC METABOLIC PANEL
BUN/Creatinine Ratio: 14 (ref 9–20)
BUN: 14 mg/dL (ref 6–24)
CO2: 21 mmol/L (ref 20–29)
Calcium: 9.8 mg/dL (ref 8.7–10.2)
Chloride: 104 mmol/L (ref 96–106)
Creatinine, Ser: 0.98 mg/dL (ref 0.76–1.27)
Glucose: 209 mg/dL — ABNORMAL HIGH (ref 70–99)
Potassium: 4.4 mmol/L (ref 3.5–5.2)
Sodium: 144 mmol/L (ref 134–144)
eGFR: 91 mL/min/{1.73_m2} (ref >59)

## 2021-05-31 MED ORDER — TRULICITY 1.5 MG/0.5ML ~~LOC~~ SOAJ: 1.5000 mg | SUBCUTANEOUS | 0 refills | Status: DC

## 2021-05-31 NOTE — Progress Notes (Signed)
Kidney function normal

## 2021-06-05 ENCOUNTER — Other Ambulatory Visit: Payer: Self-pay

## 2021-06-06 ENCOUNTER — Other Ambulatory Visit: Payer: Self-pay

## 2021-06-10 ENCOUNTER — Other Ambulatory Visit: Payer: Self-pay | Admitting: Pharmacist

## 2021-06-10 ENCOUNTER — Other Ambulatory Visit: Payer: Self-pay

## 2021-06-10 ENCOUNTER — Telehealth: Payer: Self-pay | Admitting: Family

## 2021-06-10 MED ORDER — EMPAGLIFLOZIN 25 MG PO TABS
25.0000 mg | ORAL_TABLET | Freq: Every day | ORAL | 2 refills | Status: DC
  Filled 2021-06-10: qty 30, 30d supply, fill #0
  Filled 2022-02-13: qty 30, 30d supply, fill #1

## 2021-06-10 NOTE — Telephone Encounter (Signed)
Pt is calling to request a call back from [place] is having medication concerns with trucity, farciga Pt is wanting to discuss these medications to be mailed or delivered.

## 2021-06-10 NOTE — Telephone Encounter (Signed)
Patient is needing his medications mailed to him. I remember you covering this with me before. I have a couple of questions:   Is [employer] still finding that he has insurance? If so, can we try switching him to [place]? Does he need to come in to sign forms for Trulicity? I told him so but I don't know if we could possibly mail him the forms for him to complete and then mail back to [place].  Are we able to fill a $10 month supply of Farxiga and Trulicity to hold him over until patient assistance is approved?   Let me know your thoughts. He is requesting a call from you but I wanted to give you a heads up regarding his questions before he contacted you.

## 2021-06-10 NOTE — Telephone Encounter (Signed)
Pt is wanting to discuss these medications to be mailed or delivered.

## 2021-06-14 ENCOUNTER — Telehealth: Payer: Self-pay

## 2021-06-14 ENCOUNTER — Other Ambulatory Visit: Payer: Self-pay

## 2021-06-14 NOTE — Telephone Encounter (Signed)
[employer] application has been submitted for prescription drug coverage.  Per [employer], 7-10 business days for decision.

## 2021-07-01 ENCOUNTER — Ambulatory Visit: Payer: Self-pay | Attending: Family

## 2021-07-05 ENCOUNTER — Other Ambulatory Visit: Payer: Self-pay

## 2021-07-08 ENCOUNTER — Ambulatory Visit: Payer: Self-pay | Admitting: Adult Health

## 2021-07-12 ENCOUNTER — Other Ambulatory Visit: Payer: Self-pay

## 2021-07-12 ENCOUNTER — Ambulatory Visit: Payer: Self-pay | Admitting: Adult Health

## 2021-07-26 ENCOUNTER — Other Ambulatory Visit: Payer: Self-pay

## 2021-07-26 ENCOUNTER — Ambulatory Visit: Payer: Self-pay | Attending: Family Medicine

## 2021-07-29 ENCOUNTER — Other Ambulatory Visit: Payer: Self-pay

## 2021-07-29 ENCOUNTER — Telehealth: Payer: Self-pay

## 2021-08-02 ENCOUNTER — Other Ambulatory Visit: Payer: Self-pay | Admitting: Family

## 2021-08-02 ENCOUNTER — Other Ambulatory Visit: Payer: Self-pay | Admitting: Pharmacist

## 2021-08-02 ENCOUNTER — Other Ambulatory Visit: Payer: Self-pay

## 2021-08-02 DIAGNOSIS — E119 Type 2 diabetes mellitus without complications: Secondary | ICD-10-CM

## 2021-08-02 DIAGNOSIS — E785 Hyperlipidemia, unspecified: Secondary | ICD-10-CM

## 2021-08-02 MED ORDER — AMITRIPTYLINE HCL 25 MG PO TABS: 25.0000 mg | ORAL_TABLET | Freq: Every day | ORAL | 6 refills | Status: DC

## 2021-08-02 MED ORDER — METFORMIN HCL 1000 MG PO TABS: 1000.0000 mg | ORAL_TABLET | Freq: Two times a day (BID) | ORAL | 0 refills | Status: DC

## 2021-08-02 MED ORDER — ATORVASTATIN CALCIUM 40 MG PO TABS: 40.0000 mg | ORAL_TABLET | Freq: Every day | ORAL | 1 refills | Status: DC

## 2021-08-02 MED ORDER — CLOPIDOGREL BISULFATE 75 MG PO TABS: 75.0000 mg | ORAL_TABLET | Freq: Every day | ORAL | 11 refills | Status: DC

## 2021-08-02 MED ORDER — TRULICITY 3 MG/0.5ML ~~LOC~~ SOAJ
3.0000 mg | SUBCUTANEOUS | 0 refills | Status: DC
  Filled 2021-08-02: qty 2, 28d supply, fill #0

## 2021-08-07 ENCOUNTER — Other Ambulatory Visit: Payer: Self-pay

## 2021-08-20 ENCOUNTER — Ambulatory Visit: Payer: Self-pay | Admitting: Adult Health

## 2021-08-21 ENCOUNTER — Other Ambulatory Visit: Payer: Self-pay | Admitting: Pharmacist

## 2021-08-21 MED ORDER — SEMAGLUTIDE (2 MG/DOSE) 8 MG/3ML ~~LOC~~ SOPN
2.0000 mg | PEN_INJECTOR | SUBCUTANEOUS | 2 refills | Status: DC
  Filled 2021-08-21: qty 3, fill #0
  Filled 2021-09-11: qty 3, 28d supply, fill #0
  Filled 2021-11-25 – 2022-02-13 (×2): qty 3, 28d supply, fill #1

## 2021-08-22 ENCOUNTER — Other Ambulatory Visit: Payer: Self-pay

## 2021-08-23 ENCOUNTER — Other Ambulatory Visit: Payer: Self-pay

## 2021-08-23 ENCOUNTER — Telehealth: Payer: Self-pay | Admitting: Family

## 2021-08-23 NOTE — Telephone Encounter (Signed)
I am unsure who to route this to for financial assistance. ?

## 2021-08-23 NOTE — Telephone Encounter (Signed)
Pt is calling because he is in need of his financial assistance letter uploaded into his Mychart prior to Monday for an upcoming appt at [facility].  ?Please advise [phone] ?

## 2021-08-26 ENCOUNTER — Encounter: Payer: Self-pay | Admitting: Adult Health

## 2021-08-26 ENCOUNTER — Telehealth: Payer: Self-pay | Admitting: Adult Health

## 2021-08-26 ENCOUNTER — Ambulatory Visit (INDEPENDENT_AMBULATORY_CARE_PROVIDER_SITE_OTHER): Payer: Self-pay | Admitting: Adult Health

## 2021-08-26 VITALS — BP 137/87 | HR 74 | Ht 70.0 in | Wt 170.0 lb

## 2021-08-26 DIAGNOSIS — G43109 Migraine with aura, not intractable, without status migrainosus: Secondary | ICD-10-CM

## 2021-08-26 DIAGNOSIS — Z8669 Personal history of other diseases of the nervous system and sense organs: Secondary | ICD-10-CM

## 2021-08-26 DIAGNOSIS — R2689 Other abnormalities of gait and mobility: Secondary | ICD-10-CM

## 2021-08-26 DIAGNOSIS — M5412 Radiculopathy, cervical region: Secondary | ICD-10-CM

## 2021-08-26 DIAGNOSIS — R29898 Other symptoms and signs involving the musculoskeletal system: Secondary | ICD-10-CM

## 2021-08-26 DIAGNOSIS — R200 Anesthesia of skin: Secondary | ICD-10-CM

## 2021-08-26 DIAGNOSIS — R202 Paresthesia of skin: Secondary | ICD-10-CM

## 2021-08-26 MED ORDER — NARATRIPTAN HCL 2.5 MG PO TABS: 2.5000 mg | ORAL_TABLET | ORAL | 5 refills | Status: DC | PRN

## 2021-08-26 MED ORDER — AMITRIPTYLINE HCL 50 MG PO TABS
50.0000 mg | ORAL_TABLET | Freq: Every day | ORAL | 3 refills | Status: DC
Start: 2021-08-26 — End: 2021-11-25

## 2021-08-26 NOTE — Progress Notes (Signed)
? ?[facility] ? ?PATIENT: Matthew Jennings ?DOB: 1966/01/18 ? ?REFERRING CLINICIAN: Camillia Herter, NP ?HISTORY FROM: patient  ?REASON FOR VISIT: Bell's Palsy, left-sided numbness ? ? ?HISTORICAL ? ?CHIEF COMPLAINT:  ?Chief Complaint  ?Patient presents with  ? Follow-up  ?  Rm 3 alone ?Pt is well and stable, still having numbness and imbalance   ? ? ?HISTORY OF PRESENT ILLNESS:  ? ? ?UPDATE 08/26/2021 JM: Returns for 62-monthfollow-up unaccompanied.  Reports worsening of left hand weakness more so with digit 1 and 5, greater difficulty making a fist and dropping things, does have long standing hx of neck pain which has also been progressing, can radiate down in to his shoulder and down the back side of his left arm, usually worse in the morning. Also reports continued imbalance and left leg numbness. Continues bilateral foot numbness, bottom of feet, stable. Continued migraines approximately 2-3x per week, currently on amitriptyline 25 mg nightly, will use Maxalt which can help take the edge off but unable to take during the day due to fatigue, will use naproxen during the day if needed with benefit if he takes soon enough. Does not overuse naproxen. 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 Hx of Bell's palsy, resolved.  Remains on Plavix and atorvastatin, denies side effects.  Blood pressure today 137/87.  A1c 9.0 (05/2021) with PCP making adjustments to diabetic regimen. No further concerns at this time. ? ? ? ? ?History provided for reference purposes only ?UPDATE 02/18/2021 JM: Mr. JRogersonreturns for 6 months follow-up.  No residual facial weakness or reoccurring of symptoms.  Continued left hand weakness with numbness (ring and pinky finger and occasional thumb) with difficulty picking up smaller objects and left leg numbness with leg giving out leading to imbalance.  No AD and no falls. Denies any associated pain such as in neck, lower back, hip or knee.  Also  reports occasional slurred speech especially when trying to speak quicker.  All symptoms persistent since 05/2020.  Denies new stroke/TIA symptoms.  Reports improvement of bilateral foot numbness as glucose levels improved (most recent A1c 7.4 down from 10.4).  Migraine headaches improved since prior visit - only took 1 month of amitriptyline as he was not aware he had refills.  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  Remains on atorvastatin 40 mg daily.  Blood pressure today 126/86.  No further concerns at this time. ? ?Initial consult visit 08/20/2020 Dr. PLeta Baptist ?56year old male here for evaluation of Bell's palsy. ? ?January 2022 patient had onset of left facial weakness, went to the ER for evaluation.  He was also having left hand numbness at the time and had MRI of the brain which showed enhancement of the left facial nerve.  No cause for left hand numbness was found.  Patient followed up with ENT and Bell's palsy symptoms have improved. ? ?Patient continues to have left hand numbness and weakness, mainly affecting digits 1 and 5. ? ?Patient also has bilateral toe and feet numbness. ? ?Patient has new diagnosis of diabetes, to started medications about 1 month ago.  A1c is greater than 11. ? 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 ? ?Patient has history of migraine since age [age]years old with global intense squeezing throbbing sensation,  sensitive to light and sound, triggered by weather changes.  Having 3 headaches per week.  He has tried Aleve and Keppra in the past without relief. ? ? ? ?REVIEW OF SYSTEMS: Full 14 system review of systems performed and negative with exception of: as per HPI.  ? ?ALLERGIES: ?Allergies  ?Allergen Reactions  ? Aspirin Other (See Comments)  ? Penicillins Rash  ? ? ?HOME MEDICATIONS: ?Outpatient Medications Prior  to Visit  ?Medication Sig Dispense Refill  ? atorvastatin (LIPITOR) 40 MG tablet Take 1 tablet (40 mg total) by mouth daily. 90 tablet 1  ? Blood Glucose Monitoring Suppl (TRUE METRIX METER) w/Device KIT Use as directed 1 kit 0  ? clopidogrel (PLAVIX) 75 MG tablet Take 1 tablet (75 mg total) by mouth daily. 30 tablet 11  ? empagliflozin (JARDIANCE) 25 MG TABS tablet Take 1 tablet (25 mg total) by mouth daily before breakfast. 30 tablet 2  ? glucose blood (TRUE METRIX BLOOD GLUCOSE TEST) test strip Use as instructed 100 each 12  ? Insulin Pen Needle 31G X 8 MM MISC Use as directed 100 each 0  ? metFORMIN (GLUCOPHAGE) 1000 MG tablet Take 1 tablet (1,000 mg total) by mouth 2 (two) times daily with a meal. 180 tablet 0  ? naproxen sodium (ALEVE) 220 MG tablet Take 220 mg by mouth.    ? Semaglutide, 2 MG/DOSE, 8 MG/3ML SOPN Inject 2 mg as directed once a week. 3 mL 2  ? amitriptyline (ELAVIL) 25 MG tablet Take 1 tablet (25 mg total) by mouth at bedtime. 30 tablet 6  ? rizatriptan (MAXALT-MLT) 10 MG disintegrating tablet Take 1 tablet (10 mg total) by mouth as needed for migraine. May repeat in 2 hours if needed (Patient not taking: Reported on 08/26/2021) 9 tablet 11  ? ?No facility-administered medications prior to visit.  ? ? ?PAST MEDICAL HISTORY: ?Past Medical History:  ?Diagnosis Date  ? Bell's palsy   ? left  ? Diabetes mellitus without complication ([place])   ? Facial paralysis/Bells palsy   ? Hx of migraines   ? ? ?PAST SURGICAL HISTORY: ?Past Surgical History:  ?Procedure Laterality Date  ? KNEE RECONSTRUCTION Left   ? ? ?FAMILY HISTORY: ?Family History  ?Problem Relation Age of Onset  ? Cancer Mother   ? Alzheimer's disease Father   ? ? ?SOCIAL HISTORY: ?Social History  ? ?Socioeconomic History  ? Marital status: Single  ?  Spouse name: Not on file  ? Number of children: 0  ? Years of education: Not on file  ? Highest education level: Associate degree: occupational, Hotel manager, or vocational program  ?Occupational  History  ?  Comment: EMT  ?Tobacco Use  ? Smoking status: Never  ?  Passive exposure: Yes  ? Smokeless tobacco: Never  ?Substance and Sexual Activity  ? Alcohol use: Yes  ?  Comment: occ  ? Drug use: Not Currently  ? Sexual activity: Not Currently  ?Other Topics Concern  ? Not on file  ?Social History Narrative  ? Lives alone  ? ?Social Determinants of Health  ? ?Financial Resource Strain: Not on file  ?Food Insecurity: Not on file  ?Transportation Needs: Not on file  ?Physical Activity: Not on file  ?Stress: Not on file  ?Social Connections: Not on file  ?Intimate Partner Violence: Not on file  ? ? ? ?PHYSICAL EXAM ? ?GENERAL EXAM/CONSTITUTIONAL: ?Vitals:  ?Vitals:  ? 08/26/21 0805  ?BP: 137/87  ?Pulse: 74  ?Weight: 170 lb (77.1 kg)  ?Height: 5'  10" (1.778 m)  ? ? ?Body mass index is 24.39 kg/m?. ?Wt Readings from Last 3 Encounters:  ?08/26/21 170 lb (77.1 kg)  ?05/30/21 164 lb 3.2 oz (74.5 kg)  ?02/26/21 164 lb (74.4 kg)  ? ?Patient is in no distress; well developed, nourished and groomed; neck is supple ? ?CARDIOVASCULAR: ?Examination of carotid arteries is normal; no carotid bruits ?Regular rate and rhythm, no murmurs ?Examination of peripheral vascular system by observation and palpation is normal ? ?EYES: ?Ophthalmoscopic exam of optic discs and posterior segments is normal; no papilledema or hemorrhages ? ?MUSCULOSKELETAL: ?Gait, strength, tone, movements noted in Neurologic exam below ? ?NEUROLOGIC: ?MENTAL STATUS:  ?awake, alert, oriented to person, place and time ?recent and remote memory intact ?normal attention and concentration ?language fluent, comprehension intact, naming intact ?fund of knowledge appropriate ? ?CRANIAL NERVE:  ?2nd - no papilledema on fundoscopic exam ?2nd, 3rd, 4th, 6th - pupils equal and reactive to light, visual fields full to confrontation, extraocular muscles intact, no nystagmus ?5th - facial sensation symmetric ?7th - facial strength symmetric ?8th - hearing intact ?9th -  palate elevates symmetrically, uvula midline ?11th - shoulder shrug symmetric ?12th - tongue protrusion midline ? ?MOTOR:  ?normal bulk and tone, full strength in the BUE, BLE; EXCEPT MILD ATROPHY AND WEAKNES

## 2021-08-26 NOTE — Telephone Encounter (Signed)
[facility] assistance order sent to [facility], they will reach out to the patient to schedule.  ?

## 2021-08-26 NOTE — Telephone Encounter (Signed)
Pt called in stating he is still needing a financial assistance letter sent over to the [employer] office, at fax: [phone], pt states he is currently in their office now. Please advise.  ?

## 2021-08-26 NOTE — Patient Instructions (Signed)
Increase Elavil to 50mg  nightly ? ?Start naratriptan - please stop use of Maxalt ? ?Please let me know if you have any difficulty tolerating  ? ?You will be called to schedule cervical imaging  ? ? ? ?Follow up in 3 months or call earlier if needed ?

## 2021-08-27 ENCOUNTER — Other Ambulatory Visit: Payer: Self-pay | Admitting: Pharmacist

## 2021-08-27 ENCOUNTER — Other Ambulatory Visit: Payer: Self-pay

## 2021-08-27 MED ORDER — TRULICITY 3 MG/0.5ML ~~LOC~~ SOAJ
3.0000 mg | SUBCUTANEOUS | 0 refills | Status: DC
  Filled 2021-08-27: qty 2, 28d supply, fill #0

## 2021-09-02 ENCOUNTER — Telehealth: Payer: Self-pay | Admitting: Family

## 2021-09-02 NOTE — Telephone Encounter (Signed)
Call returned to patient. I explained to him that I can send a referral to [employer] [facility] [place] but there is no guarantee that they will be able to assist him.  He said he understood and just would like to know his options about paying his county taxes.  ?Referral then faxed to Donavan Burnet, Attorney [facility] ?

## 2021-09-02 NOTE — Telephone Encounter (Signed)
Copied from CRM [id number]. Topic: General - Other ?>> Sep 02, 2021  9:57 AM McGill, Darlina Rumpf wrote: ?Reason for CRM: Pt stated that Mikle Bosworth was helping him with a referral to [employer] [facility] [place] for county taxes.? Pt stated that since last speaking with Mikle Bosworth, nobody has communicated with him.? ? ?Pt is requesting a call back with a follow-up. ?

## 2021-09-07 IMAGING — MR MR BRAIN/IAC WO/W CM
11 of 17 series · 30 of 48 positions shown · IV contrast (gadavist)
Comparison: No pertinent prior exams available for comparison.

CLINICAL DATA: Cranial neuropathy (cranial nerve 7); left facial
droop (also reports some extension of sensory numbness into left
arm).

EXAM:
MRI HEAD WITHOUT AND WITH CONTRAST
TECHNIQUE: Multiplanar, multiecho pulse sequences of the brain and surrounding
structures were obtained without and with intravenous contrast.
CONTRAST:  7mL GADAVIST GADOBUTROL 1 MMOL/ML IV SOLN

[Series 5: ax dwi_tracew · axial · 3.0mm · 0.60mm/px · z∈[-73,+2]mm · 2 of 48 slices shown]
[im 1/48]
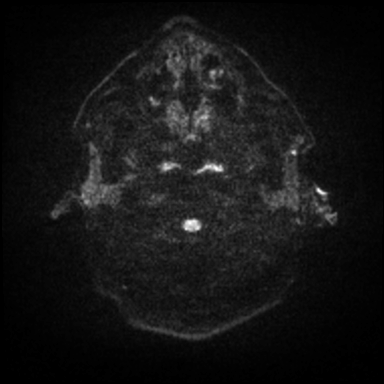
[im 24/48]
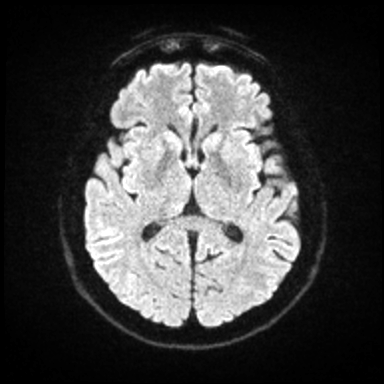

[Series 9: T1 · sagittal · 5.0mm · 0.62mm/px · 1 of 25 slices shown (1 of 4)]
[im 1/25]
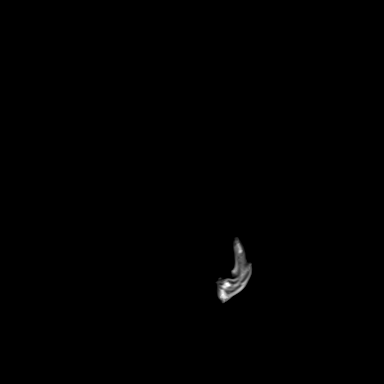

[Series 10: T2 · axial · 5.0mm · 0.53mm/px · 1 of 25 slices shown (1 of 2)]
[im 1/25]
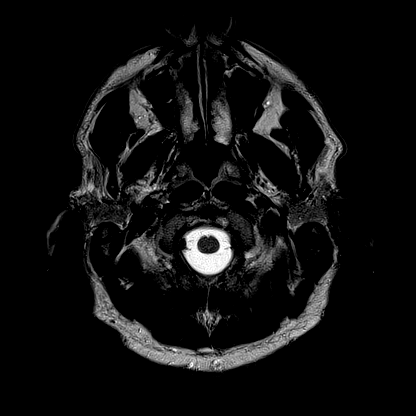

[Series 15: FLAIR · axial · 3.0mm · 0.53mm/px · z∈[-75,+85]mm · 3 of 55 slices shown]
[im 1/55]
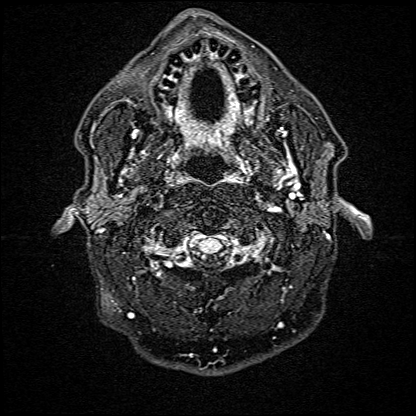
[im 28/55]
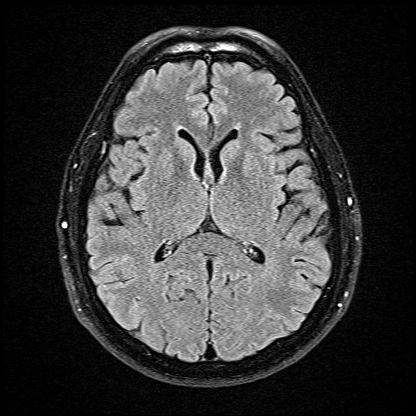
[im 55/55]
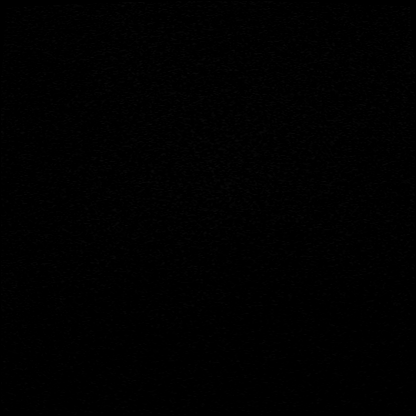

[Series 16: T1 · axial · 1.0mm · 0.98mm/px · z∈[-84,+89]mm · 8 of 175 slices shown (2 of 4)]
[im 1/175]
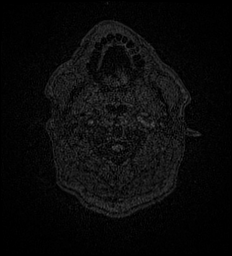
[im 22/175]
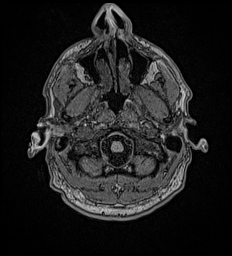
[im 44/175]
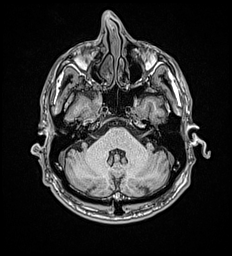
[im 66/175]
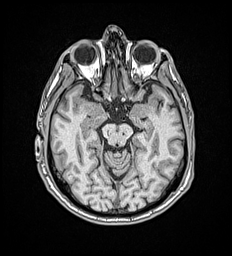
[im 109/175]
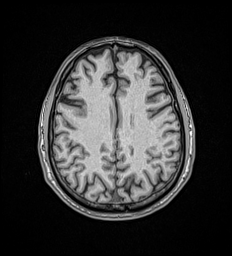
[im 131/175]
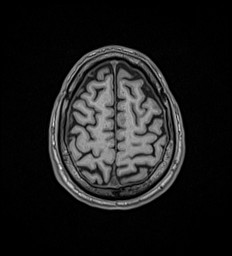
[im 153/175]
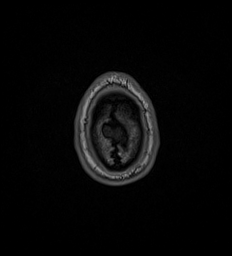
[im 175/175]
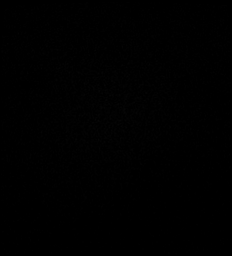

[Series 17: T2 · coronal · 5.0mm · 0.57mm/px · 2 of 29 slices shown (2 of 2)]
[im 1/29]
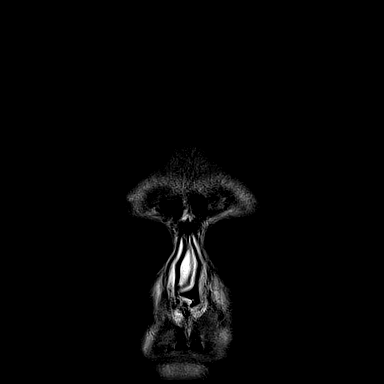
[im 29/29]
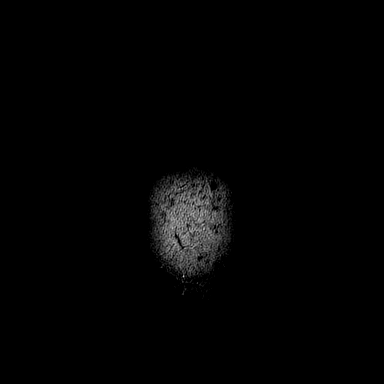

[Series 19: T1 · axial · non-contrast · 3.0mm · 0.21mm/px · 1 of 18 slices shown (3 of 4)]
[im 1/18]
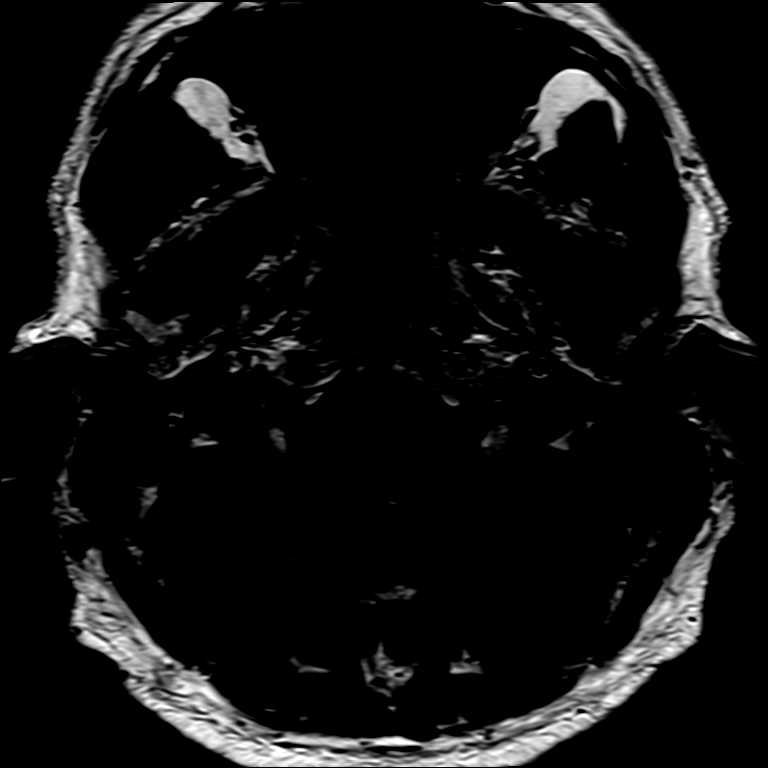

[Series 20: T1 · coronal · non-contrast · 3.0mm · 0.21mm/px · 1 of 17 slices shown (4 of 4)]
[im 1/17]
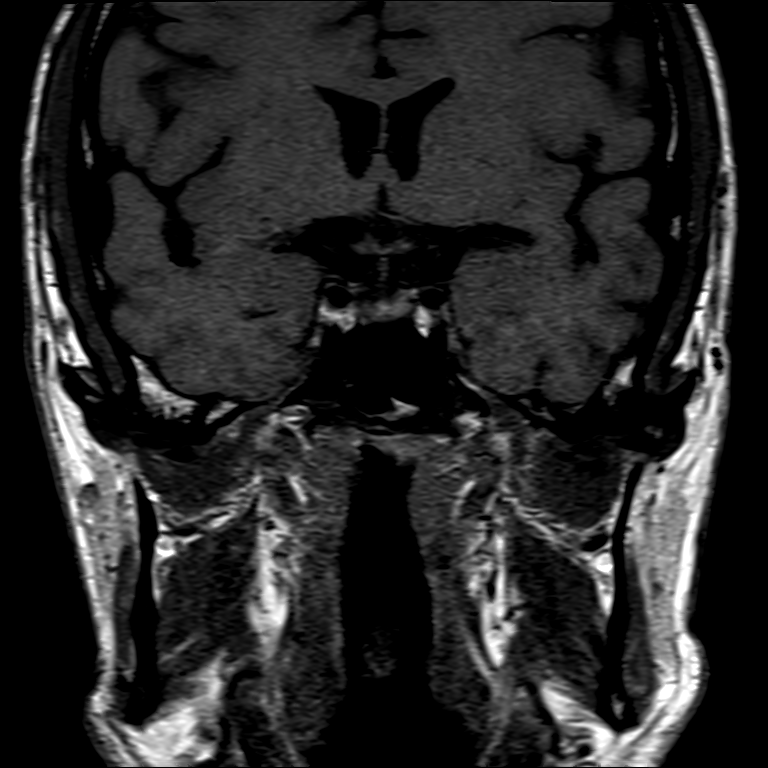

[Series 21: T1 post-contrast · axial · 3.0mm · 0.21mm/px · 1 of 18 slices shown (1 of 3)]
[im 1/18]
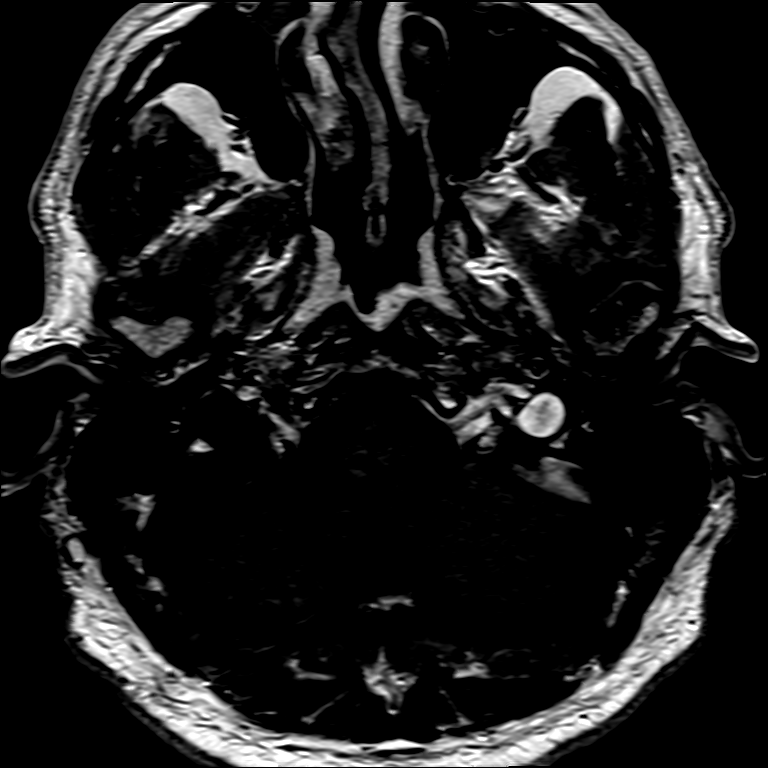

[Series 22: T1 post-contrast · coronal · 3.0mm · 0.21mm/px · 1 of 17 slices shown (2 of 3)]
[im 1/17]
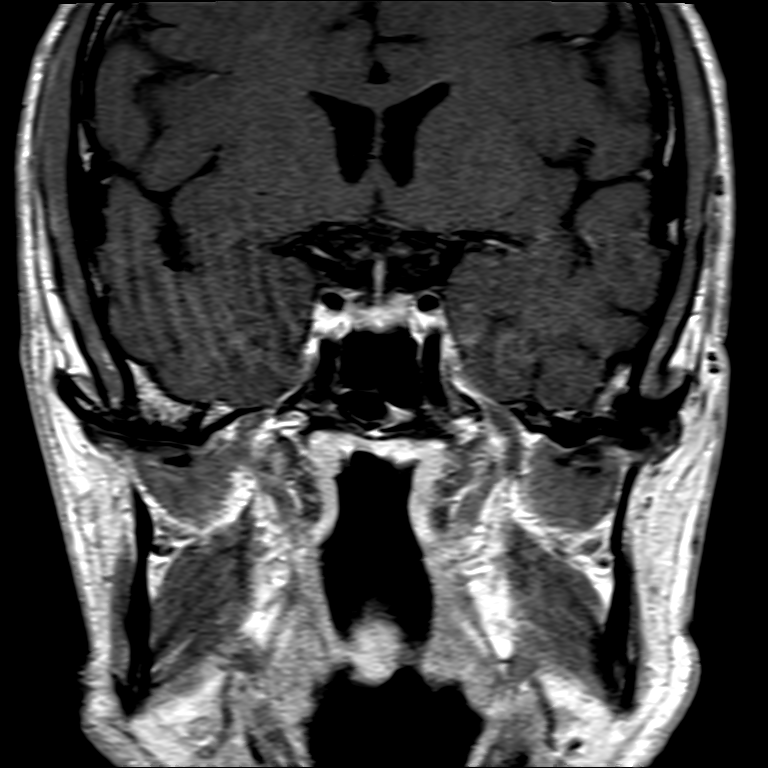

[Series 23: T1 post-contrast · axial · 1.0mm · 0.98mm/px · z∈[-84,+88]mm · 9 of 173 slices shown (3 of 3)]
[im 1/173]
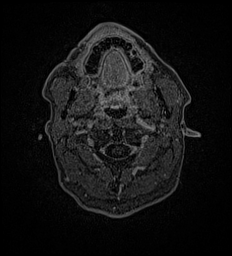
[im 22/173]
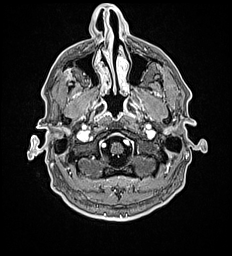
[im 44/173]
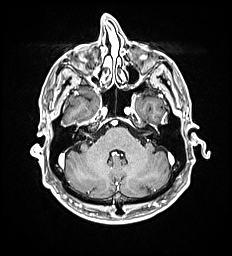
[im 65/173]
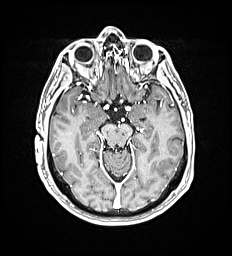
[im 87/173]
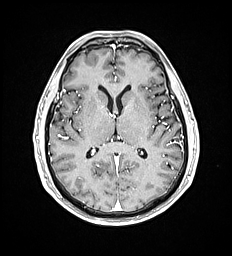
[im 108/173]
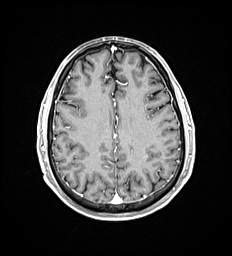
[im 130/173]
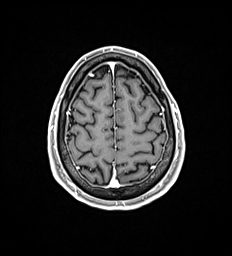
[im 151/173]
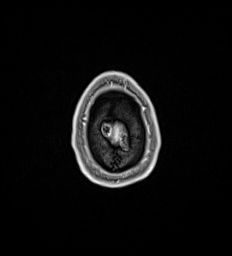
[im 173/173]
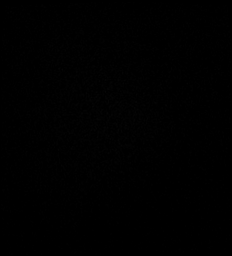

[30 of 48 positions shown; findings below may reference images not displayed]

FINDINGS: Brain:

Cerebral volume is normal for age.

Prominent asymmetric enhancement of the left seventh cranial nerve
extending from the left IAC fundus through the mastoid segment
(series 23, images [id number]).

No focal parenchymal signal abnormality or abnormal intracranial
enhancement is identified elsewhere.

There is no acute infarct.

No chronic intracranial blood products.

No extra-axial fluid collection.

No midline shift.

Vascular: Expected proximal arterial flow voids.

Skull and upper cervical spine: No focal marrow lesion.

Sinuses/Orbits: Visualized orbits show no acute finding. Trace
ethmoid sinus mucosal thickening. Small right maxillary sinus mucous
retention cyst.

Other: Nonspecific T2 hyperintensity and enhancement within the left
mandibular condyle.
IMPRESSION: Prominent asymmetric enhancement of the left seventh cranial nerve
extending from the IAC fundus through the mastoid segment. Findings
are compatible with Bell's palsy in the appropriate clinical
setting. Should the patient's symptoms persist or follow an
unexpected course, follow-up brain MRI with contrast and with IAC
protocol recommended.

T2 hyperintensity and enhancement within the left mandibular
condyle, nonspecific but possibly degenerative or inflammatory in
etiology. Clinical correlation is recommended.

## 2021-09-11 ENCOUNTER — Other Ambulatory Visit: Payer: Self-pay

## 2021-09-16 ENCOUNTER — Ambulatory Visit
Admission: RE | Admit: 2021-09-16 | Discharge: 2021-09-16 | Disposition: A | Discharge status: Home / Self Care | Payer: Self-pay | Source: Ambulatory Visit | Attending: Adult Health | Admitting: Adult Health

## 2021-09-16 DIAGNOSIS — M5412 Radiculopathy, cervical region: Secondary | ICD-10-CM

## 2021-09-18 ENCOUNTER — Other Ambulatory Visit: Payer: Self-pay | Admitting: Adult Health

## 2021-09-18 ENCOUNTER — Telehealth: Payer: Self-pay | Admitting: Adult Health

## 2021-09-18 DIAGNOSIS — R202 Paresthesia of skin: Secondary | ICD-10-CM

## 2021-09-18 DIAGNOSIS — R29898 Other symptoms and signs involving the musculoskeletal system: Secondary | ICD-10-CM

## 2021-09-18 DIAGNOSIS — M5412 Radiculopathy, cervical region: Secondary | ICD-10-CM

## 2021-09-18 NOTE — Telephone Encounter (Signed)
Referral for Neurosurgery sent to [facility] [phone]. 

## 2021-09-23 NOTE — Telephone Encounter (Signed)
Pt states that did not get the below note, he states that he needs a referral # for Donavan Burnet in re to the legal aid. [phone]. States has had a change in health and is imperative. Pls return call to listed #  ? ?

## 2021-09-23 NOTE — Telephone Encounter (Signed)
Unsure what his code orange card it? Can this be further looked in to and referral be sent a location that accepts this? Thank you! ?

## 2021-09-23 NOTE — Telephone Encounter (Signed)
Will forward to Jane 

## 2021-09-23 NOTE — Telephone Encounter (Signed)
Pt states he was told by Shanda Bumps, NP that if he were told that his Code [facility] card was not accepted by [facility]  to call back and let her know.  Please call ? ?

## 2021-09-23 NOTE — Telephone Encounter (Signed)
I called and spoke to patient and informed him that I received a message from Donavan Burnet, Attorney/ [facility] noting that they do not handle tax issues which is why the initial referral was made to [facility].  The patient explained that he has taken care of the tax issues and he now needs to apply for Medicaid.  The neurologist referred him to a neurosurgeon and the neurosurgeons in [place] do not accept the [employer] and he is not working and not able to pay out of pocket for his care. He said he does not have access to a computer to apply for Medicaid on line. I told him that I would submit another referral to [facility] for assistance with applying for Medicaid and he was very appreciative.  ?The referral was then faxed to Donavan Burnet, Attorney/[facility] ?

## 2021-09-24 NOTE — Telephone Encounter (Signed)
Do you know anything about this? Please advise

## 2021-10-01 ENCOUNTER — Other Ambulatory Visit: Payer: Self-pay

## 2021-10-02 NOTE — Telephone Encounter (Signed)
I have no clue what an Orange card is, looking at what is scanned in his chart is [employer].

## 2021-10-03 NOTE — Telephone Encounter (Signed)
Can the referral be sent to a location that will accept this? It has been over a week now since he called

## 2021-10-14 ENCOUNTER — Telehealth: Payer: Self-pay

## 2021-10-14 NOTE — Telephone Encounter (Signed)
Message received from [facility], Attorney/[facility] stating that she and the patient have exchanged messages but they have not been able to speak to each other yet.

## 2021-10-31 ENCOUNTER — Other Ambulatory Visit: Payer: Self-pay

## 2021-11-04 ENCOUNTER — Telehealth: Payer: Self-pay | Admitting: Family

## 2021-11-04 NOTE — Telephone Encounter (Signed)
Pt is calling to renew his [employer] Application. Pt has called 3 times request a call back for an appt. Pt is also wanting to know is there a lapse in his coverage. Pt received a bill for imaging.  Cb- [phone]

## 2021-11-15 NOTE — Telephone Encounter (Signed)
Pt is calling to schedule CAFA application appt. Please advise CB- [phone]

## 2021-11-18 NOTE — Telephone Encounter (Signed)
Please reach out to patient.

## 2021-11-25 ENCOUNTER — Other Ambulatory Visit: Payer: Self-pay

## 2021-11-25 ENCOUNTER — Encounter: Payer: Self-pay | Admitting: Adult Health

## 2021-11-25 ENCOUNTER — Ambulatory Visit (INDEPENDENT_AMBULATORY_CARE_PROVIDER_SITE_OTHER): Payer: Self-pay | Admitting: Adult Health

## 2021-11-25 VITALS — BP 129/93 | HR 92 | Ht 70.0 in | Wt 164.5 lb

## 2021-11-25 DIAGNOSIS — R200 Anesthesia of skin: Secondary | ICD-10-CM

## 2021-11-25 DIAGNOSIS — R202 Paresthesia of skin: Secondary | ICD-10-CM

## 2021-11-25 DIAGNOSIS — M5412 Radiculopathy, cervical region: Secondary | ICD-10-CM

## 2021-11-25 DIAGNOSIS — Z8669 Personal history of other diseases of the nervous system and sense organs: Secondary | ICD-10-CM

## 2021-11-25 DIAGNOSIS — R29898 Other symptoms and signs involving the musculoskeletal system: Secondary | ICD-10-CM

## 2021-11-25 DIAGNOSIS — G43109 Migraine with aura, not intractable, without status migrainosus: Secondary | ICD-10-CM

## 2021-11-25 MED ORDER — NARATRIPTAN HCL 2.5 MG PO TABS
2.5000 mg | ORAL_TABLET | ORAL | 5 refills | Status: DC | PRN
  Filled 2021-11-25: qty 9, 23d supply, fill #0
  Filled 2022-02-13: qty 9, 30d supply, fill #0

## 2021-11-25 MED ORDER — AMITRIPTYLINE HCL 75 MG PO TABS
75.0000 mg | ORAL_TABLET | Freq: Every day | ORAL | 5 refills | Status: DC
  Filled 2021-11-25 – 2022-02-13 (×2): qty 30, 30d supply, fill #0

## 2021-11-25 NOTE — Patient Instructions (Addendum)
Increase amitriptyline to 75mg  nightly - please ensure you are only taking at night time  Use of naratriptan as needed for severe migraine/headache, may repeat x1 after 2 hours if needed. No more than 2 tablets in a 24 hour period. Please let me know if you are unable to tolerate or no benefit.   Referral placed to orthopedics for further evaluation of neck issues - if you do not hear from them in the next 2 weeks, please let me know!   Continue clopidogrel 75 mg daily  and atorvastatin  for secondary stroke prevention  Continue to follow up with PCP regarding cholesterol, diabetes and blood pressure management  Maintain strict control of hypertension with blood pressure goal below 130/90, diabetes with hemoglobin A1c goal below 7.0 % and cholesterol with LDL cholesterol (bad cholesterol) goal below 70 mg/dL.   Signs of a Stroke? Follow the BEFAST method:  Balance Watch for a sudden loss of balance, trouble with coordination or vertigo Eyes Is there a sudden loss of vision in one or both eyes? Or double vision?  Face: Ask the person to smile. Does one side of the face droop or is it numb?  Arms: Ask the person to raise both arms. Does one arm drift downward? Is there weakness or numbness of a leg? Speech: Ask the person to repeat a simple phrase. Does the speech sound slurred/strange? Is the person confused ? Time: If you observe any of these signs, call 911.     Followup in the future with me in 6 months or call earlier if needed     Thank you for coming to see at [facility]. I hope we have been able to provide you high quality care today.  You may receive a patient satisfaction survey over the next few weeks. We would appreciate your feedback and comments so that we may continue to improve ourselves and the health of our patients.

## 2021-11-25 NOTE — Telephone Encounter (Signed)
Referral for Orthopedics sent to [facility] [phone].

## 2021-11-25 NOTE — Progress Notes (Signed)
[facility]  PATIENT: Matthew Jennings DOB: 1966/02/22  REFERRING CLINICIAN: Camillia Herter, NP HISTORY FROM: patient  REASON FOR VISIT: Bell's Palsy, left-sided numbness   HISTORICAL  CHIEF COMPLAINT:  Chief Complaint  Patient presents with   Follow-up    Pt reports feeling pretty good. He states problems from the last visit still persist, other than that he's been feeling fine. He states that he's still having migraines and numbness and lost of strength on left side. Room 2 alone    HISTORY OF PRESENT ILLNESS:   Update 11/25/2021 JM: Patient returns for 51-monthfollow-up unaccompanied.  Left arm weakness/numbness persist. Continued difficulty with using left hand. Does have left shoulder pain. Reports numbness/pain especially from elbow to outer hand. Continued neck pain. Can have difficulty sleeping at night.  Completed MR cervical spine which showed severe bilateral foraminal stenosis and disc bulging at C5-6 and C6-7, referral placed to neurosurgery but per patient, refused to see patient due to being covered under [facility].   Continued left leg numbness and imbalance. Continued b/l foot numbness.   Migraines still 2-3x per week but severe migraine about 1x per week which can be debilitating, did see much benefit after increasing amitriptyline dosage at prior visit. He does take nightly but has also been taking as needed for acute management in addition to naproxen. Migraines typically resolve after 30 to 60 minutes. Previously prescribed naratriptan as intolerance to rizatriptan and sumatriptan but reports he never received prescription.  Compliant on Plavix and atorvastatin. Denies any new stroke type symptoms. Blood pressure today 129/93.  Prior follow-up with PCP 6 months ago.  He is in the process of applying for Medicaid, reports needs to apply for disability first. Trying to collect all prior medical information as advised by social security.   MRI  cervical spine 09/16/2021 IMPRESSION:  - At C6-7 disc bulging and uncovertebral joint hypertrophy with moderate spinal stenosis and severe bilateral foraminal stenosis. No cord signal changes. - At C5-6 disc bulging and uncovertebral joint hypertrophy with mild spinal stenosis and severe bilateral foraminal stenosis. No cord signal changes. - At C4-5 right ward disc bulging and uncovertebral joint hypertrophy with severe right foraminal stenosis.     History provided for reference purposes only UPDATE 08/26/2021 JM: Returns for 665-monthollow-up unaccompanied.  Reports worsening of left hand weakness more so with digit 1 and 5, greater difficulty making a fist and dropping things, does have long standing hx of neck pain which has also been progressing, can radiate down in to his shoulder and down the back side of his left arm, usually worse in the morning. Also reports continued imbalance and left leg numbness. Continues bilateral foot numbness, bottom of feet, stable. Continued migraines approximately 2-3x per week, currently on amitriptyline 25 mg nightly, will use Maxalt which can help take the edge off but unable to take during the day due to fatigue, will use naproxen during the day if needed with benefit if he takes soon enough. Does not overuse naproxen. 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 Hx of Bell's palsy, resolved.  Remains on Plavix and atorvastatin, denies side effects.  Blood pressure today 137/87.  A1c 9.0 (05/2021) with PCP making adjustments to diabetic regimen. No further concerns at this time.   UPDATE 02/18/2021 JM: Mr. JeVogelsangeturns for 6 months follow-up.  No residual facial weakness or reoccurring of symptoms.  Continued left hand weakness with numbness (ring and pinky finger  and occasional thumb) with difficulty picking up smaller objects and left leg numbness with leg giving out leading to imbalance.  No AD and no falls. Denies any associated pain  such as in neck, lower back, hip or knee.  Also reports occasional slurred speech especially when trying to speak quicker.  All symptoms persistent since 05/2020.  Denies new stroke/TIA symptoms.  Reports improvement of bilateral foot numbness as glucose levels improved (most recent A1c 7.4 down from 10.4).  Migraine headaches improved since prior visit - only took 1 month of amitriptyline as he was not aware he had refills.  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 He has been doing cooking at home (like he would at work) without difficulty.  Remains on atorvastatin 40 mg daily.  Blood pressure today 126/86.  No further concerns at this time.   Initial consult visit 08/20/2020 Dr. Leta Baptist: 56 year old male here for evaluation of Bell's palsy.  January 2022 patient had onset of left facial weakness, went to the ER for evaluation.  He was also having left hand numbness at the time and had MRI of the brain which showed enhancement of the left facial nerve.  No cause for left hand numbness was found.  Patient followed up with ENT and Bell's palsy symptoms have improved.  Patient continues to have left hand numbness and weakness, mainly affecting digits 1 and 5.  Patient also has bilateral toe and feet numbness.  Patient has new diagnosis of diabetes, to started medications about 1 month ago.  A1c is greater than 11.  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  Patient has history of migraine since age 66 years old with global intense squeezing throbbing sensation, sensitive to light and sound, triggered by weather changes.  Having 3 headaches per week.  He has tried Aleve and Keppra in the past without relief.    REVIEW OF SYSTEMS: Full 14 system review of systems performed and negative with exception of: as per HPI.   ALLERGIES: Allergies  Allergen Reactions   Aspirin Other (See Comments)   Penicillins Rash     HOME MEDICATIONS: Outpatient Medications Prior to Visit  Medication Sig Dispense Refill   atorvastatin (LIPITOR) 40 MG tablet Take 1 tablet (40 mg total) by mouth daily. 90 tablet 1   Blood Glucose Monitoring Suppl (TRUE METRIX METER) w/Device KIT Use as directed 1 kit 0   clopidogrel (PLAVIX) 75 MG tablet Take 1 tablet (75 mg total) by mouth daily. 30 tablet 11   empagliflozin (JARDIANCE) 25 MG TABS tablet Take 1 tablet (25 mg total) by mouth daily before breakfast. 30 tablet 2   glucose blood (TRUE METRIX BLOOD GLUCOSE TEST) test strip Use as instructed 100 each 12   Insulin Pen Needle 31G X 8 MM MISC Use as directed 100 each 0   naproxen sodium (ALEVE) 220 MG tablet Take 220 mg by mouth.     amitriptyline (ELAVIL) 50 MG tablet Take 1 tablet (50 mg total) by mouth at bedtime. 90 tablet 3   naratriptan (AMERGE) 2.5 MG tablet Take 1 tablet (2.5 mg total) by mouth as needed for migraine. Take one (1) tablet at onset of headache; if returns or does not resolve, may repeat after 4 hours; do not exceed five (5) mg in 24 hours. 10 tablet 5   Dulaglutide (TRULICITY) 3 AQ/7.6AU SOPN Inject 3 mg as directed once a  week. 2 mL 0   metFORMIN (GLUCOPHAGE) 1000 MG tablet Take 1 tablet (1,000 mg total) by mouth 2 (two) times daily with a meal. 180 tablet 0   Semaglutide, 2 MG/DOSE, 8 MG/3ML SOPN Inject 2 mg as directed once a week. (Patient not taking: Reported on 11/25/2021) 3 mL 2   No facility-administered medications prior to visit.    PAST MEDICAL HISTORY: Past Medical History:  Diagnosis Date   Bell's palsy    left   Diabetes mellitus without complication (HCC)    Facial paralysis/Bells palsy    Hx of migraines     PAST SURGICAL HISTORY: Past Surgical History:  Procedure Laterality Date   KNEE RECONSTRUCTION Left     FAMILY HISTORY: Family History  Problem Relation Age of Onset   Cancer Mother    Alzheimer's disease Father     SOCIAL HISTORY: Social History   Socioeconomic  History   Marital status: Single    Spouse name: Not on file   Number of children: 0   Years of education: Not on file   Highest education level: Associate degree: occupational, Hotel manager, or vocational program  Occupational History    Comment: EMT  Tobacco Use   Smoking status: Never    Passive exposure: Yes   Smokeless tobacco: Never  Substance and Sexual Activity   Alcohol use: Yes    Comment: occ   Drug use: Not Currently   Sexual activity: Not Currently  Other Topics Concern   Not on file  Social History Narrative   Lives alone   Social Determinants of Health   Financial Resource Strain: Not on file  Food Insecurity: Not on file  Transportation Needs: Not on file  Physical Activity: Not on file  Stress: Not on file  Social Connections: Not on file  Intimate Partner Violence: Not on file     PHYSICAL EXAM  GENERAL EXAM/CONSTITUTIONAL: Vitals:  Vitals:   11/25/21 0757  BP: (!) 129/93  Pulse: 92  Weight: 164 lb 8 oz (74.6 kg)  Height: '5\' 10"'  (1.778 m)     Body mass index is 23.6 kg/m. Wt Readings from Last 3 Encounters:  11/25/21 164 lb 8 oz (74.6 kg)  08/26/21 170 lb (77.1 kg)  05/30/21 164 lb 3.2 oz (74.5 kg)   Patient is in no distress; well developed, nourished and groomed; neck is supple  CARDIOVASCULAR: Examination of carotid arteries is normal; no carotid bruits Regular rate and rhythm, no murmurs Examination of peripheral vascular system by observation and palpation is normal  EYES: Ophthalmoscopic exam of optic discs and posterior segments is normal; no papilledema or hemorrhages  MUSCULOSKELETAL: Gait, strength, tone, movements noted in Neurologic exam below  NEUROLOGIC: MENTAL STATUS:  awake, alert, oriented to person, place and time recent and remote memory intact normal attention and concentration language fluent, comprehension intact, naming intact fund of knowledge appropriate  CRANIAL NERVE:  2nd - no papilledema on  fundoscopic exam 2nd, 3rd, 4th, 6th - pupils equal and reactive to light, visual fields full to confrontation, extraocular muscles intact, no nystagmus 5th - facial sensation symmetric 7th - facial strength symmetric 8th - hearing intact 9th - palate elevates symmetrically, uvula midline 11th - shoulder shrug symmetric 12th - tongue protrusion midline  MOTOR:  normal bulk and tone, full strength in the RUE and RLE; LUE 4/5 WEAKNESS THROUGHOUT AND LLE 4+/5 HF AND ADF, MILD ATROPHY AND WEAKNESS LEFT HAND INTRINSIC MUSCLES, DIFFICULTY MAKING COMPLETE FIST.   SENSORY:  normal symmetrically  to vibration; DECREASED LIGHT TOUCH AND PP SENSATION LUE AND LLE AND DECR PP IN LEFT HAND DIGIT 1 AND 5  COORDINATION:  finger-nose-finger mild incoordination of LUE, fine finger movements normal  REFLEXES:  deep tendon reflexes TRACE and symmetric  GAIT/STATION:  narrow based gait with mild imbalance with decreased LLE stride length and step height without use of assistive device. Able to stand from seated position with arms crossed.  Romberg positive     DIAGNOSTIC DATA (LABS, IMAGING, TESTING) - I reviewed patient records, labs, notes, testing and imaging myself where available.  Lab Results  Component Value Date   WBC 5.7 05/17/2020   HGB 14.9 05/17/2020   HCT 43.2 05/17/2020   MCV 85.5 05/17/2020   PLT 205 05/17/2020      Component Value Date/Time   NA 144 05/30/2021 0927   K 4.4 05/30/2021 0927   CL 104 05/30/2021 0927   CO2 21 05/30/2021 0927   GLUCOSE 209 (H) 05/30/2021 0927   GLUCOSE 348 (H) 05/17/2020 1416   BUN 14 05/30/2021 0927   CREATININE 0.98 05/30/2021 0927   CALCIUM 9.8 05/30/2021 0927   PROT 8.3 (H) 05/17/2020 1416   ALBUMIN 4.6 05/17/2020 1416   AST 23 05/17/2020 1416   ALT 33 05/17/2020 1416   ALKPHOS 99 05/17/2020 1416   BILITOT 0.6 05/17/2020 1416   GFRNONAA >60 05/17/2020 1416   Lab Results  Component Value Date   CHOL 125 02/26/2021   HDL 45  02/26/2021   LDLCALC 64 02/26/2021   TRIG 81 02/26/2021   CHOLHDL 2.8 02/26/2021   Lab Results  Component Value Date   HGBA1C 9.0 (A) 05/30/2021   No results found for: "VITAMINB12" Lab Results  Component Value Date   TSH 1.720 07/06/2020    05/17/20 MRI brain / IAC  - Prominent asymmetric enhancement of the left seventh cranial nerve extending from the IAC fundus through the mastoid segment. Findings are compatible with Bell's palsy in the appropriate clinical setting. Should the patient's symptoms persist or follow an unexpected course, follow-up brain MRI with contrast and with IAC protocol recommended. - T2 hyperintensity and enhancement within the left mandibular condyle, nonspecific but possibly degenerative or inflammatory in etiology. Clinical correlation is recommended.  MRI CERVICAL SPINE WO CONTRAST 09/16/2021 IMPRESSION:  MRI cervical spine (without) demonstrating: - At C6-7 disc bulging and uncovertebral joint hypertrophy with moderate spinal stenosis and severe bilateral foraminal stenosis. No cord signal changes. - At C5-6 disc bulging and uncovertebral joint hypertrophy with mild spinal stenosis and severe bilateral foraminal stenosis. No cord signal changes. - At C4-5 right ward disc bulging and uncovertebral joint hypertrophy with severe right foraminal stenosis.        ASSESSMENT AND PLAN  56 y.o. year old male here with:  Dx:  1. Cervical radiculopathy   2. Left hand weakness   3. Numbness and tingling in left hand   4. Migraine with aura and without status migrainosus, not intractable   5. History of Bell's palsy        PLAN:   LEFT BELL'S PALSY (resolved) - continue supportive care   MILD LEFT SIDED WEAKNESS AND PARESTHESIAS -continued symptoms -Unknown etiology although possible small right sided stroke not seen on imaging -continue Plavix 75 mg daily (aspirin allergy) and atorvastatin 40 mg daily for stroke prevention  measures -Declines interest in further stroke work-up  -Declines interest in EMG/NCV -Continue to follow with PCP for aggressive stroke risk factor management including HTN with BP goal<130/90,  HLD with LDL goal<70 and DM with A1c goal<7.0 - advised to schedule f/u with PCP as no recent f/u  -A1c 9.0 (05/2021) up from 7.4 - PCP made adjustments to diabetic regimen -LDL 64 (02/2021)   LEFT HAND NUMBNESS (likely peripheral neuropathy; carpal tunnel syndrome + ulnar neuropathy; in setting of diabetic polyneuropathy) -reports continued gradual worsening with hand weakness, neck pain and left arm radiculopathy.  -MR cervical 09/16/2021 severe bilateral foraminal stenosis and disc bulging at C5-6 and C6-7 and severe right foraminal stenosis C4-5 -referral placed to neurosurgery but unable to be seen due to [place] coverage therefore will place referral to orthopedics -discussed possible need of EMG/NCS but will defer to ortho evaluation   GAIT IMPAIRMENT/IMBALANCE -Suspect multifactorial in setting of neuropathy, LLE weakness and possible cervical etiology -Declines interest in PT   MIGRAINE WITH AURA -reports 2-3 migraines per week which can last 2-12 hours -increase amitriptyline to 75 mg nightly -advised to start naratriptan for acute management  -Prior intolerance to rizatriptan and sumatriptan     Meds ordered this encounter  Medications   naratriptan (AMERGE) 2.5 MG tablet    Sig: Take 1 tablet (2.5 mg total) by mouth as needed for migraine. Take one (1) tablet at onset of headache; if returns or does not resolve, may repeat after 4 hours; do not exceed five (5) mg in 24 hours.    Dispense:  10 tablet    Refill:  5   amitriptyline (ELAVIL) 75 MG tablet    Sig: Take 1 tablet (75 mg total) by mouth at bedtime.    Dispense:  30 tablet    Refill:  5    Orders Placed This Encounter  Procedures   AMB referral to orthopedics    Referral Priority:   Routine    Referral Type:    Consultation    Number of Visits Requested:   1      Return in about 6 months (around 05/28/2022).    CC:  Camillia Herter, NP    I spent 32 minutes of face-to-face and non-face-to-face time with patient.  This included previsit chart review, lab review, study review, order entry, electronic health record documentation, patient education and discussion regarding worsening left hand weakness and further evaluation, continued left-sided symptoms, further stroke prevention measures and importance of aggressive stroke risk factor management, history of Bell's palsy and migraine with aura and answered all other questions to patient satisfaction  Frann Rider, AGNP-BC  [facility] [address] [place], [place] [address]  Phone [phone] Fax [phone] Note: This document was prepared with digital dictation and possible smart phrase technology. Any transcriptional errors that result from this process are unintentional.

## 2021-11-26 ENCOUNTER — Other Ambulatory Visit: Payer: Self-pay

## 2021-12-02 ENCOUNTER — Other Ambulatory Visit: Payer: Self-pay

## 2021-12-02 ENCOUNTER — Telehealth: Payer: Self-pay | Admitting: Adult Health

## 2021-12-02 NOTE — Telephone Encounter (Signed)
Pt called and said the referral for [facility] need to be changed because they don't accept his insurance. Pt said he is still in pain and really need to see a specialist soon.

## 2021-12-03 ENCOUNTER — Other Ambulatory Visit: Payer: Self-pay

## 2021-12-05 NOTE — Telephone Encounter (Signed)
Sent pt a Mychart message relaying the information that was told to me by [facility].

## 2021-12-09 NOTE — Telephone Encounter (Signed)
Pt said, [facility] refused to see him. Because do not accept Matthew Jennings and Wellness program Would like a call from the nurse.

## 2021-12-10 ENCOUNTER — Other Ambulatory Visit: Payer: Self-pay

## 2021-12-10 NOTE — Telephone Encounter (Signed)
Is there another location he can go to?

## 2021-12-11 NOTE — Telephone Encounter (Signed)
Addressed via mychart

## 2021-12-11 NOTE — Telephone Encounter (Signed)
This is from [place] 9 days ago: I called [facility], they do not have an Orthopaedic office to refer patients to right now.    This insurance does cover any orthopedic location.

## 2021-12-30 ENCOUNTER — Other Ambulatory Visit: Payer: Self-pay

## 2022-01-20 ENCOUNTER — Other Ambulatory Visit: Payer: Self-pay

## 2022-02-13 ENCOUNTER — Other Ambulatory Visit: Payer: Self-pay | Admitting: Family Medicine

## 2022-02-13 ENCOUNTER — Other Ambulatory Visit: Payer: Self-pay

## 2022-02-13 ENCOUNTER — Other Ambulatory Visit: Payer: Self-pay | Admitting: Pharmacist

## 2022-02-13 MED ORDER — SEMAGLUTIDE (2 MG/DOSE) 8 MG/3ML ~~LOC~~ SOPN
2.0000 mg | PEN_INJECTOR | SUBCUTANEOUS | 2 refills | Status: DC
  Filled 2022-02-13 – 2022-03-07 (×2): qty 3, 28d supply, fill #0
  Filled 2022-04-08: qty 6, 56d supply, fill #1

## 2022-02-13 MED ORDER — TRULICITY 3 MG/0.5ML ~~LOC~~ SOAJ
3.0000 mg | SUBCUTANEOUS | 0 refills | Status: DC
Start: 2022-02-13 — End: 2022-08-13
  Filled 2022-02-13 – 2022-03-07 (×2): qty 2, 28d supply, fill #0

## 2022-02-17 ENCOUNTER — Other Ambulatory Visit: Payer: Self-pay

## 2022-02-17 ENCOUNTER — Telehealth: Payer: Self-pay | Admitting: Adult Health

## 2022-02-17 NOTE — Telephone Encounter (Signed)
LVM requesting call back.

## 2022-02-17 NOTE — Telephone Encounter (Signed)
Pt said, migraines starting to get more frequency for the last 2 weeks; every other day. Pain level is a 7 or 8, takes medication and lay down to sleep. Would like a call from the nurse.

## 2022-02-17 NOTE — Telephone Encounter (Signed)
Yes, would like him to take amitriptyline nightly as prescribed, will not work if only taking as needed. If headaches persist over the next couple weeks, please let us know for possible need of dosage increase.

## 2022-02-17 NOTE — Telephone Encounter (Signed)
Called patient who stated he was supposed to get meds through the mail ,cannot drive all the way to the pharmacy. He said he never got new amitriptyline Rx or naratriptan, didn't call pharmacy to check. He gets all other meds thru [facility] 90 day supply, in mail.  He has been taking amitriptyline 75 mg as needed, never picked up naratriptan, takes naproxen as needed. I advised I'll call pharmacy and call him back, may need to send those Rx to [facility]. Patient verbalized understanding, appreciation. [facility], spoke with Nira Conn who stated they have amitriptyline and naratriptan  Rx, 30 day supply ready to be picked up. I advised her the patient stated he cannot drive to get them. She will mail, may be able to go out today.  Sent to NP for any further advice.

## 2022-02-18 ENCOUNTER — Encounter: Payer: Self-pay | Admitting: *Deleted

## 2022-02-18 ENCOUNTER — Other Ambulatory Visit (HOSPITAL_BASED_OUTPATIENT_CLINIC_OR_DEPARTMENT_OTHER): Payer: Self-pay

## 2022-02-18 ENCOUNTER — Other Ambulatory Visit (HOSPITAL_COMMUNITY): Payer: Self-pay

## 2022-02-18 ENCOUNTER — Other Ambulatory Visit: Payer: Self-pay

## 2022-02-20 ENCOUNTER — Other Ambulatory Visit: Payer: Self-pay

## 2022-03-07 ENCOUNTER — Other Ambulatory Visit: Payer: Self-pay

## 2022-04-01 ENCOUNTER — Ambulatory Visit: Payer: Self-pay | Admitting: *Deleted

## 2022-04-01 ENCOUNTER — Emergency Department
Admission: EM | Admit: 2022-04-01 | Discharge: 2022-04-01 | Disposition: A | Discharge status: Home / Self Care | Payer: Self-pay | Attending: Emergency Medicine | Admitting: Emergency Medicine

## 2022-04-01 ENCOUNTER — Ambulatory Visit
Admission: EM | Admit: 2022-04-01 | Discharge: 2022-04-01 | Discharge status: Against Medical Advice | Payer: No Typology Code available for payment source

## 2022-04-01 ENCOUNTER — Emergency Department: Payer: Self-pay

## 2022-04-01 ENCOUNTER — Other Ambulatory Visit: Payer: Self-pay

## 2022-04-01 DIAGNOSIS — R234 Changes in skin texture: Secondary | ICD-10-CM

## 2022-04-01 DIAGNOSIS — L03031 Cellulitis of right toe: Secondary | ICD-10-CM | POA: Insufficient documentation

## 2022-04-01 DIAGNOSIS — E119 Type 2 diabetes mellitus without complications: Secondary | ICD-10-CM | POA: Insufficient documentation

## 2022-04-01 DIAGNOSIS — W228XXA Striking against or struck by other objects, initial encounter: Secondary | ICD-10-CM | POA: Insufficient documentation

## 2022-04-01 DIAGNOSIS — L988 Other specified disorders of the skin and subcutaneous tissue: Secondary | ICD-10-CM | POA: Insufficient documentation

## 2022-04-01 LAB — CBC WITH DIFFERENTIAL/PLATELET
Abs Immature Granulocytes: 0.02 10*3/uL (ref 0.00–0.07)
Basophils Absolute: 0 10*3/uL (ref 0.0–0.1)
Basophils Relative: 1 %
Eosinophils Absolute: 0.1 10*3/uL (ref 0.0–0.5)
Eosinophils Relative: 2 %
HCT: 42 % (ref 39.0–52.0)
Hemoglobin: 14 g/dL (ref 13.0–17.0)
Immature Granulocytes: 0 %
Lymphocytes Relative: 18 %
Lymphs Abs: 1.2 10*3/uL (ref 0.7–4.0)
MCH: 28.4 pg (ref 26.0–34.0)
MCHC: 33.3 g/dL (ref 30.0–36.0)
MCV: 85.2 fL (ref 80.0–100.0)
Monocytes Absolute: 0.5 10*3/uL (ref 0.1–1.0)
Monocytes Relative: 8 %
Neutro Abs: 4.8 10*3/uL (ref 1.7–7.7)
Neutrophils Relative %: 71 %
Platelets: 201 10*3/uL (ref 150–400)
RBC: 4.93 MIL/uL (ref 4.22–5.81)
RDW: 13.3 % (ref 11.5–15.5)
WBC: 6.7 10*3/uL (ref 4.0–10.5)
nRBC: 0 % (ref 0.0–0.2)

## 2022-04-01 LAB — BASIC METABOLIC PANEL
Anion gap: 6 (ref 5–15)
BUN: 26 mg/dL — ABNORMAL HIGH (ref 6–20)
CO2: 26 mmol/L (ref 22–32)
Calcium: 9.4 mg/dL (ref 8.9–10.3)
Chloride: 109 mmol/L (ref 98–111)
Creatinine, Ser: 0.81 mg/dL (ref 0.61–1.24)
GFR, Estimated: >60 mL/min (ref >60)
Glucose, Bld: 116 mg/dL — ABNORMAL HIGH (ref 70–99)
Potassium: 4.1 mmol/L (ref 3.5–5.1)
Sodium: 141 mmol/L (ref 135–145)

## 2022-04-01 MED ORDER — CLINDAMYCIN HCL 150 MG PO CAPS
450.0000 mg | ORAL_CAPSULE | Freq: Three times a day (TID) | ORAL | 0 refills | Status: AC
  Filled 2022-04-01: qty 63, 7d supply, fill #0

## 2022-04-01 NOTE — Telephone Encounter (Signed)
This encounter was created in error - please disregard.

## 2022-04-01 NOTE — Telephone Encounter (Signed)
Pt states that he has been to 2 different UC locations in [place] and neither would see him. I advised him to go to ED in [place] and be seen there because whether insurance accepted or not cannot be denied care. Pt verbalized understanding.

## 2022-04-01 NOTE — Telephone Encounter (Signed)
Provider aware

## 2022-04-01 NOTE — Telephone Encounter (Signed)
Reason for Disposition  [1] Diabetes mellitus AND [2] small cut (scratch) or abrasion (scrape)  Answer Assessment - Initial Assessment Questions 1. MECHANISM: "How did the injury happen?"      Caught on edge of floor corner 2. ONSET: "When did the injury happen?" (Minutes or hours ago)      Last Thursday 3. LOCATION: "What part of the toe is injured?" "Is the nail damaged?"      Right foot, big toe 4. APPEARANCE of TOE INJURY: "What does the injury look like?"      Black and red 5. SEVERITY: "Can you use the foot normally?" "Can you walk?"      yes 6. SIZE: For cuts, bruises, or swelling, ask: "How large is it?" (e.g., inches or centimeters;  entire toe)       7. PAIN: "Is there pain?" If Yes, ask: "How bad is the pain?"   (e.g., Scale 1-10; or mild, moderate, severe) With pressure   8. TETANUS: For any breaks in the skin, ask: "When was the last tetanus booster?"     Unsure 9. DIABETES: "Do you have a history of diabetes or poor circulation in the feet?"     Yes 10. OTHER SYMPTOMS: "Do you have any other symptoms?"        Oozing clear fluid  Protocols used: Toe Injury-A-AH

## 2022-04-01 NOTE — Discharge Instructions (Signed)
Please schedule an appointment with a podiatrist to further evaluate this area.  You may need to have a debridement done if it is not healing.  Your x-ray was negative for any evidence of osteomyelitis and your blood work was reassuring we are starting you on some antibiotics to help with the infection.  Return if the redness is worsening fevers or any other concerns

## 2022-04-01 NOTE — Telephone Encounter (Signed)
  Chief Complaint: Toe Injury Symptoms: Right foot great toe, black "Oozing clear drainage. "Squishy" " Red, no warmth, blistered. Frequency: Last Thursday Pertinent Negatives: Patient denies Fever Disposition: [] ED /[x] Urgent Care (no appt availability in office) / [] Appointment(In office/virtual)/ []  [facility]/ [] Home Care/ [] Refused Recommended Disposition /[] [facility]/ []  Follow-up with PCP Additional Notes: No availability at practice. Pt is diabetic, declines [facility]. Advised UC, states will follow disposition.

## 2022-04-01 NOTE — ED Triage Notes (Signed)
Pt sts that he hit his right big toe last Thursday and had a bandage on it and took it off this am an noticed that the tip of the toe is black. No odor present. Pt is A/Ox4

## 2022-04-01 NOTE — ED Provider Notes (Addendum)
[facility] Provider Note    Event Date/Time   First MD Initiated Contact with Patient 04/01/22 1340     (approximate)   History   Wound Infection   HPI  Matthew Jennings is a 56 y.o. male with diabetes who comes in with right big toe injury.  Patient reports that last Thursday about 6 days ago that he hit the toe on the floor board and that he had a blood blister on the top of the.  He reports putting a bandage on it and not taking it off until today when he noticed that the toe was red in color and the tip was slightly discolored blackish.  He denies any fevers, odor or really significant pain.  Tdap updated last year.   Physical Exam   Triage Vital Signs: ED Triage Vitals [04/01/22 1305]  Enc Vitals Group     BP 138/87     Pulse Rate 92     Resp 18     Temp 98.9 F (37.2 C)     Temp Source Oral     SpO2 95 %     Weight 160 lb (72.6 kg)     Height      Head Circumference      Peak Flow      Pain Score      Pain Loc      Pain Edu?      Excl. in GC?     Most recent vital signs: Vitals:   04/01/22 1305  BP: 138/87  Pulse: 92  Resp: 18  Temp: 98.9 F (37.2 C)  SpO2: 95%     General: Awake, no distress.  CV:  Good peripheral perfusion.  Resp:  Normal effort.  Abd:  No distention.  Other:  Patient has 2+ distal pulse in the foot.  He has no paronychia noted.  He does have discoloration of the tip of the toe which is suspect is like an eschar from the prior injury a little bit of redness noted on the toe that goes little bit below the fingernail +DP and +PT 2+   ED Results / Procedures / Treatments   Labs (all labs ordered are listed, but only abnormal results are displayed) Labs Reviewed  CBC WITH DIFFERENTIAL/PLATELET  BASIC METABOLIC PANEL    :    RADIOLOGY I have reviewed the xray personally and interpreted and no evidence of bony destruction  PROCEDURES:  Critical Care performed: No  Procedures   MEDICATIONS  ORDERED IN ED: Medications - No data to display   IMPRESSION / MDM / ASSESSMENT AND PLAN / ED COURSE  I reviewed the triage vital signs and the nursing notes.   Patient's presentation is most consistent with acute presentation with potential threat to life or bodily function.   I suspect this is most likely cellulitis with a concurrent eschar from the prior injury no evidence of vascular issue.  Discussed with podiatry Dr. Allena Katz who reviewed media pictures who recommends x-ray and blood work and if this is all reassuring patient could most likely go home with follow-up in podiatry clinic on oral antibiotics.  Does not feel like he needs emergent debridement as it looks like eschar from the injury.  He has very good distal pulse I do not feel that this is secondary to poor blood flow or acute vascular issue  X-ray without evidence of osteo my suspicion is low so we will hold off on MRI at this time.  CBC is normal without any evidence of significant white count elevation therefore patient will be discharged home. BMP no evidence of DKA  Reviewed note from 11/2021 for cervical radiculopathy   Pt denies any stents in leg and on re-assessment continue good pulses. Pt feels comfortable with dc but udnerstands need to f.u with podiatry because may need debridement. No signs of osteo at this time and discoloration is superficial- no open ulcer that probes to bone however I did discuss with patient that there is always a risk of this happening and the need for amputation and that given he is diabetic that this can be harder to heal and he expressed understanding and will follow-up closely with podiatry  Patient reports anaphylaxis to penicillin therefore we will hold off on cephalosporin and use clindamycin to cover mrsa/strep    FINAL CLINICAL IMPRESSION(S) / ED DIAGNOSES   Final diagnoses:  Cellulitis of toe of right foot  Eschar of toe     Rx / DC Orders   ED Discharge Orders           Ordered    clindamycin (CLEOCIN) 150 MG capsule  3 times daily        04/01/22 1453             Note:  This document was prepared using Dragon voice recognition software and may include unintentional dictation errors.   Concha Se, MD 04/01/22 1510    Concha Se, MD 04/01/22 1510    Concha Se, MD 04/01/22 [phone]

## 2022-04-07 ENCOUNTER — Other Ambulatory Visit: Payer: Self-pay

## 2022-04-08 ENCOUNTER — Other Ambulatory Visit: Payer: Self-pay

## 2022-04-30 NOTE — Telephone Encounter (Signed)
Created in error

## 2022-06-02 ENCOUNTER — Telehealth: Payer: Self-pay | Admitting: Adult Health

## 2022-06-02 ENCOUNTER — Ambulatory Visit: Payer: Medicaid Other | Admitting: Adult Health

## 2022-06-02 ENCOUNTER — Encounter: Payer: Self-pay | Admitting: Adult Health

## 2022-06-02 ENCOUNTER — Ambulatory Visit (INDEPENDENT_AMBULATORY_CARE_PROVIDER_SITE_OTHER): Payer: Medicaid Other | Admitting: Adult Health

## 2022-06-02 VITALS — BP 118/82 | HR 75 | Ht 70.0 in | Wt 157.0 lb

## 2022-06-02 DIAGNOSIS — R29898 Other symptoms and signs involving the musculoskeletal system: Secondary | ICD-10-CM | POA: Diagnosis not present

## 2022-06-02 DIAGNOSIS — Z8669 Personal history of other diseases of the nervous system and sense organs: Secondary | ICD-10-CM | POA: Diagnosis not present

## 2022-06-02 DIAGNOSIS — R2689 Other abnormalities of gait and mobility: Secondary | ICD-10-CM | POA: Diagnosis not present

## 2022-06-02 DIAGNOSIS — G43109 Migraine with aura, not intractable, without status migrainosus: Secondary | ICD-10-CM | POA: Diagnosis not present

## 2022-06-02 DIAGNOSIS — M5412 Radiculopathy, cervical region: Secondary | ICD-10-CM | POA: Diagnosis not present

## 2022-06-02 DIAGNOSIS — R531 Weakness: Secondary | ICD-10-CM | POA: Diagnosis not present

## 2022-06-02 DIAGNOSIS — Z79899 Other long term (current) drug therapy: Secondary | ICD-10-CM | POA: Diagnosis not present

## 2022-06-02 DIAGNOSIS — R200 Anesthesia of skin: Secondary | ICD-10-CM | POA: Diagnosis not present

## 2022-06-02 DIAGNOSIS — R7989 Other specified abnormal findings of blood chemistry: Secondary | ICD-10-CM | POA: Diagnosis not present

## 2022-06-02 MED ORDER — AMITRIPTYLINE HCL 100 MG PO TABS: 100.0000 mg | ORAL_TABLET | Freq: Every day | ORAL | 5 refills | Status: DC

## 2022-06-02 MED ORDER — NARATRIPTAN HCL 2.5 MG PO TABS: 2.5000 mg | ORAL_TABLET | ORAL | 5 refills | Status: DC | PRN

## 2022-06-02 NOTE — Progress Notes (Addendum)
[facility]  PATIENT: Octavia BrucknerBryant D Iribe DOB: [birth year]  REFERRING CLINICIAN: Rema FendtStephens, Amy J, NP HISTORY FROM: patient  REASON FOR VISIT: Bell's Palsy, left-sided numbness   HISTORICAL  CHIEF COMPLAINT:  Chief Complaint  Patient presents with   Follow-up    Rm 3 alone Pt is well, still having weakness, pain and numbness on L side. Not much improvement since last visit      HISTORY OF PRESENT ILLNESS:    Update 06/02/2022 JM: Patient returns for 1079-month follow-up.  He continues to experience left-sided numbness and weakness which has been gradually worsening since prior visit. Does have mild RLE numbness distally but this has been stable. He has difficulty holding or picking up objects and has been having more difficulty with his left leg giving out at times while ambulating, he does questions some weakness in LLE. Denies lower back pain. Has had difficulty sleeping due to pain especially in his neck and left shoulder. Has been previously referred to neurosurgery and orthopedics for cervical radiculopathy with difficulty finding office that will accept his insurance but has been recently approved for Medicaid.  He continues to apply for disability.  Migraines have improved since prior visit, about 3 migraines per month. Current use of amitriptyline 75 mg nightly. Was unable to try naratriptan due to co-pay.  He is interested in trying that he has Medicaid.  Denies any new stroke or neurological symptoms.  Remains on Plavix and atorvastatin.  Blood pressure well-controlled.  Has not had recent follow-up with PCP.    History provided for reference purposes only Update 11/25/2021 JM: Patient returns for 442-month follow-up unaccompanied.  Left arm weakness/numbness persist. Continued difficulty with using left hand. Does have left shoulder pain. Reports numbness/pain especially from elbow to outer hand. Continued neck pain. Can have difficulty sleeping at night.   Completed MR cervical spine which showed severe bilateral foraminal stenosis and disc bulging at C5-6 and C6-7, referral placed to neurosurgery but per patient, refused to see patient due to being covered under [facility].   Continued left leg numbness and imbalance. Continued b/l foot numbness.   Migraines still 2-3x per week but severe migraine about 1x per week which can be debilitating, did see much benefit after increasing amitriptyline dosage at prior visit. He does take nightly but has also been taking as needed for acute management in addition to naproxen. Migraines typically resolve after 30 to 60 minutes. Previously prescribed naratriptan as intolerance to rizatriptan and sumatriptan but reports he never received prescription.  Compliant on Plavix and atorvastatin. Denies any new stroke type symptoms. Blood pressure today 129/93.  Prior follow-up with PCP 6 months ago.  He is in the process of applying for Medicaid, reports needs to apply for disability first. Trying to collect all prior medical information as advised by social security.   MRI cervical spine 09/16/2021 IMPRESSION:  - At C6-7 disc bulging and uncovertebral joint hypertrophy with moderate spinal stenosis and severe bilateral foraminal stenosis. No cord signal changes. - At C5-6 disc bulging and uncovertebral joint hypertrophy with mild spinal stenosis and severe bilateral foraminal stenosis. No cord signal changes. - At C4-5 right ward disc bulging and uncovertebral joint hypertrophy with severe right foraminal stenosis.     History provided for reference purposes only UPDATE 08/26/2021 JM: Returns for 6179-month follow-up unaccompanied.  Reports worsening of left hand weakness more so with digit 1 and 5, greater difficulty making a fist and dropping things, does have long standing hx of  neck pain which has also been progressing, can radiate down in to his shoulder and down the back side of his left arm, usually worse in the  morning. Also reports continued imbalance and left leg numbness. Continues bilateral foot numbness, bottom of feet, stable. Continued migraines approximately 2-3x per week, currently on amitriptyline 25 mg nightly, will use Maxalt which can help take the edge off but unable to take during the day due to fatigue, will use naproxen during the day if needed with benefit if he takes soon enough. Does not overuse naproxen. 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 Hx of Bell's palsy, resolved.  Remains on Plavix and atorvastatin, denies side effects.  Blood pressure today 137/87.  A1c 9.0 (05/2021) with PCP making adjustments to diabetic regimen. No further concerns at this time.   UPDATE 02/18/2021 JM: Mr. Cooler returns for 6 months follow-up.  No residual facial weakness or reoccurring of symptoms.  Continued left hand weakness with numbness (ring and pinky finger and occasional thumb) with difficulty picking up smaller objects and left leg numbness with leg giving out leading to imbalance.  No AD and no falls. Denies any associated pain such as in neck, lower back, hip or knee.  Also reports occasional slurred speech especially when trying to speak quicker.  All symptoms persistent since 05/2020.  Denies new stroke/TIA symptoms.  Reports improvement of bilateral foot numbness as glucose levels improved (most recent A1c 7.4 down from 10.4).  Migraine headaches improved since prior visit - only took 1 month of amitriptyline as he was not aware he had refills.  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 He has been doing cooking at home (like he would at work) without difficulty.  Remains on atorvastatin 40 mg daily.  Blood pressure today 126/86.  No further concerns at this time.   Initial consult visit 08/20/2020 Dr. Leta Baptist: 57 year old male here for evaluation of Bell's palsy.  January 2022 patient had onset of left facial weakness, went to the ER for evaluation.   He was also having left hand numbness at the time and had MRI of the brain which showed enhancement of the left facial nerve.  No cause for left hand numbness was found.  Patient followed up with ENT and Bell's palsy symptoms have improved.  Patient continues to have left hand numbness and weakness, mainly affecting digits 1 and 5.  Patient also has bilateral toe and feet numbness.  Patient has new diagnosis of diabetes, to started medications about 1 month ago.  A1c is greater than 11.  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  Patient has history of migraine since age 59 years old with global intense squeezing throbbing sensation, sensitive to light and sound, triggered by weather changes.  Having 3 headaches per week.  He has tried Aleve and Keppra in the past without relief.    REVIEW OF SYSTEMS: Full 14 system review of systems performed and negative with exception of: as per HPI.   ALLERGIES: Allergies  Allergen Reactions   Aspirin Other (See Comments)   Penicillins Rash    HOME MEDICATIONS: Outpatient Medications Prior to Visit  Medication Sig Dispense Refill   atorvastatin (LIPITOR) 40 MG tablet Take 1 tablet (40 mg total) by mouth daily. 90 tablet 1   Blood Glucose Monitoring Suppl (TRUE METRIX METER) w/Device KIT Use as directed 1  kit 0   clopidogrel (PLAVIX) 75 MG tablet Take 1 tablet (75 mg total) by mouth daily. 30 tablet 11   Dulaglutide (TRULICITY) 3 MG/0.5ML SOPN Inject 3 mg as directed once a week. 2 mL 0   empagliflozin (JARDIANCE) 25 MG TABS tablet Take 1 tablet (25 mg total) by mouth daily before breakfast. 30 tablet 2   glucose blood (TRUE METRIX BLOOD GLUCOSE TEST) test strip Use as instructed 100 each 12   Insulin Pen Needle 31G X 8 MM MISC Use as directed 100 each 0   metFORMIN (GLUCOPHAGE) 1000 MG tablet Take 1 tablet (1,000 mg total) by mouth 2 (two) times daily with a meal. 180 tablet 0    naproxen sodium (ALEVE) 220 MG tablet Take 220 mg by mouth.     Semaglutide, 2 MG/DOSE, 8 MG/3ML SOPN Inject 2 mg as directed once a week. 3 mL 2   amitriptyline (ELAVIL) 75 MG tablet Take 1 tablet (75 mg total) by mouth at bedtime. 30 tablet 5   naratriptan (AMERGE) 2.5 MG tablet Take 1 tablet (2.5 mg total) by mouth as needed for migraine. Take one (1) tablet at onset of headache; if returns or does not resolve, may repeat after 4 hours; do not exceed five (5) mg in 24 hours. 10 tablet 5   No facility-administered medications prior to visit.    PAST MEDICAL HISTORY: Past Medical History:  Diagnosis Date   Bell's palsy    left   Diabetes mellitus without complication (HCC)    Facial paralysis/Bells palsy    Hx of migraines     PAST SURGICAL HISTORY: Past Surgical History:  Procedure Laterality Date   KNEE RECONSTRUCTION Left     FAMILY HISTORY: Family History  Problem Relation Age of Onset   Cancer Mother    Alzheimer's disease Father     SOCIAL HISTORY: Social History   Socioeconomic History   Marital status: Single    Spouse name: Not on file   Number of children: 0   Years of education: Not on file   Highest education level: Associate degree: occupational, Scientist, product/process development, or vocational program  Occupational History    Comment: EMT  Tobacco Use   Smoking status: Never    Passive exposure: Yes   Smokeless tobacco: Never  Substance and Sexual Activity   Alcohol use: Yes    Comment: occ   Drug use: Not Currently   Sexual activity: Not Currently  Other Topics Concern   Not on file  Social History Narrative   Lives alone   Social Determinants of Health   Financial Resource Strain: Not on file  Food Insecurity: Not on file  Transportation Needs: Not on file  Physical Activity: Not on file  Stress: Not on file  Social Connections: Not on file  Intimate Partner Violence: Not on file     PHYSICAL EXAM  GENERAL EXAM/CONSTITUTIONAL: Vitals:  Vitals:    06/02/22 1052  BP: 118/82  Pulse: 75  Weight: 157 lb (71.2 kg)  Height: 5\' 10"  (1.778 m)   Body mass index is 22.53 kg/m. Wt Readings from Last 3 Encounters:  06/02/22 157 lb (71.2 kg)  04/01/22 160 lb (72.6 kg)  11/25/21 164 lb 8 oz (74.6 kg)   Patient is in no distress; very pleasant middle-age Caucasian male, well developed, nourished and groomed; neck is supple  CARDIOVASCULAR: Examination of carotid arteries is normal; no carotid bruits Regular rate and rhythm, no murmurs Examination of peripheral vascular system by observation and  palpation is normal  EYES: Ophthalmoscopic exam of optic discs and posterior segments is normal; no papilledema or hemorrhages  MUSCULOSKELETAL: Gait, strength, tone, movements noted in Neurologic exam below  NEUROLOGIC: MENTAL STATUS:  awake, alert, oriented to person, place and time recent and remote memory intact normal attention and concentration language fluent, comprehension intact, naming intact fund of knowledge appropriate  CRANIAL NERVE:  2nd - no papilledema on fundoscopic exam 2nd, 3rd, 4th, 6th - pupils equal and reactive to light, visual fields full to confrontation, extraocular muscles intact, no nystagmus 5th - facial sensation symmetric 7th - facial strength symmetric 8th - hearing intact 9th - palate elevates symmetrically, uvula midline 11th - shoulder shrug symmetric 12th - tongue protrusion midline  MOTOR:  normal bulk and tone, full strength in the RUE and RLE; LUE 4/5 WEAKNESS THROUGHOUT AND LLE 4+/5 HF AND ADF, MILD ATROPHY AND WEAKNESS LEFT HAND INTRINSIC MUSCLES, DIFFICULTY MAKING COMPLETE FIST.   SENSORY:  normal symmetrically to vibration; DECREASED LIGHT TOUCH AND PP SENSATION LUE AND LLE AND DECR PP IN LEFT HAND DIGIT 1 AND 5  COORDINATION:  finger-nose-finger mild incoordination of LUE, slightly decreased left fine finger movements; normal on right side  REFLEXES:  deep tendon reflexes TRACE and  symmetric  GAIT/STATION:  narrow based gait with mild imbalance with decreased LLE stride length and step height without use of assistive device. Able to stand from seated position with arms crossed.  Romberg positive     DIAGNOSTIC DATA (LABS, IMAGING, TESTING) - I reviewed patient records, labs, notes, testing and imaging myself where available.  Lab Results  Component Value Date   WBC 6.7 04/01/2022   HGB 14.0 04/01/2022   HCT 42.0 04/01/2022   MCV 85.2 04/01/2022   PLT 201 04/01/2022      Component Value Date/Time   NA 141 04/01/2022 1440   NA 144 05/30/2021 0927   K 4.1 04/01/2022 1440   CL 109 04/01/2022 1440   CO2 26 04/01/2022 1440   GLUCOSE 116 (H) 04/01/2022 1440   BUN 26 (H) 04/01/2022 1440   BUN 14 05/30/2021 0927   CREATININE 0.81 04/01/2022 1440   CALCIUM 9.4 04/01/2022 1440   PROT 8.3 (H) 05/17/2020 1416   ALBUMIN 4.6 05/17/2020 1416   AST 23 05/17/2020 1416   ALT 33 05/17/2020 1416   ALKPHOS 99 05/17/2020 1416   BILITOT 0.6 05/17/2020 1416   GFRNONAA >60 04/01/2022 1440   Lab Results  Component Value Date   CHOL 125 02/26/2021   HDL 45 02/26/2021   LDLCALC 64 02/26/2021   TRIG 81 02/26/2021   CHOLHDL 2.8 02/26/2021   Lab Results  Component Value Date   HGBA1C 9.0 (A) 05/30/2021   No results found for: "VITAMINB12" Lab Results  Component Value Date   TSH 1.720 07/06/2020    05/17/20 MRI brain / IAC  - Prominent asymmetric enhancement of the left seventh cranial nerve extending from the IAC fundus through the mastoid segment. Findings are compatible with Bell's palsy in the appropriate clinical setting. Should the patient's symptoms persist or follow an unexpected course, follow-up brain MRI with contrast and with IAC protocol recommended. - T2 hyperintensity and enhancement within the left mandibular condyle, nonspecific but possibly degenerative or inflammatory in etiology. Clinical correlation is recommended.  MRI CERVICAL SPINE WO  CONTRAST 09/16/2021 IMPRESSION:  MRI cervical spine (without) demonstrating: - At C6-7 disc bulging and uncovertebral joint hypertrophy with moderate spinal stenosis and severe bilateral foraminal stenosis. No cord signal changes. -  At C5-6 disc bulging and uncovertebral joint hypertrophy with mild spinal stenosis and severe bilateral foraminal stenosis. No cord signal changes. - At C4-5 right ward disc bulging and uncovertebral joint hypertrophy with severe right foraminal stenosis.        ASSESSMENT AND PLAN  57 y.o. year old male here with:  Dx:  1. Cervical radiculopathy   2. Left hand weakness   3. Left-sided weakness   4. Left sided numbness      PLAN:   LEFT SIDED WEAKNESS AND PARESTHESIAS LEFT HAND NUMBNESS (likely peripheral neuropathy; carpal tunnel syndrome + ulnar neuropathy; in setting of diabetic polyneuropathy) -persistent symptoms gradually worsening of unknown etiology -now Medicaid approved, patient wishes to proceed with EMG/NCV for further evaluation, will repeat study to be completed on left upper and lower extremity  -Repeat A1c and TSH, obtain B12 level -Increase amitriptyline to 100 mg nightly to further help with pain affecting sleep -Prior consideration of small right-sided stroke not seen on imaging but due to gradual worsening, unsure if this is the case, recommend continuation of Plavix 75 mg daily (aspirin allergy) and atorvastatin 40 mg daily for stroke prevention measures -Declines interest in further stroke work-up  -Continue to follow with PCP for aggressive stroke risk factor management including HTN with BP goal<130/90, HLD with LDL goal<70 and DM with A1c goal<7.0 - advised to schedule f/u with PCP as no recent f/u  -MR cervical 09/16/2021 severe bilateral foraminal stenosis and disc bulging at C5-6 and C6-7 and severe right foraminal stenosis C4-5  -referral placed to neurosurgery now that he has Medicaid (previously referred to neurosurgery  and then to orthopedics but neither accepted prior [facility] insurance)    MIGRAINE WITH AURA -Significantly improved since prior amitriptyline dosage increase, currently only 3 per month -continue amitriptyline, increasing to 100mg  nightly for pain and sleep complaints  -advised to start naratriptan for acute management  -Prior intolerance to rizatriptan and sumatriptan   GAIT IMPAIRMENT/IMBALANCE -Suspect multifactorial in setting of neuropathy, LLE weakness and possible cervical etiology -Declines interest in PT   LEFT BELL'S PALSY (resolved) - continue supportive care    Meds ordered this encounter  Medications   amitriptyline (ELAVIL) 100 MG tablet    Sig: Take 1 tablet (100 mg total) by mouth at bedtime.    Dispense:  30 tablet    Refill:  5   naratriptan (AMERGE) 2.5 MG tablet    Sig: Take 1 tablet (2.5 mg total) by mouth as needed for migraine. Take one (1) tablet at onset of headache; if returns or does not resolve, may repeat after 4 hours; do not exceed five (5) mg in 24 hours.    Dispense:  10 tablet    Refill:  5    Orders Placed This Encounter  Procedures   Hemoglobin A1c   Vitamin B12   TSH   Ambulatory referral to Neurosurgery    Referral Priority:   Routine    Referral Type:   Surgical    Referral Reason:   Specialty Services Required    Requested Specialty:   Neurosurgery    Number of Visits Requested:   1   NCV with EMG(electromyography)    Standing Status:   Future    Standing Expiration Date:   06/03/2023    Order Specific Question:   Where should this test be performed?    Answer:   [facility]    Order Specific Question:   Location on body    Answer:  Upper extremity    Comments:   and lower extremity    Order Specific Question:   Laterality    Answer:   Bilateral    Order Specific Question:   Clinical Indication    Answer:   left arm and leg numbness and weakness, bilateral foot numbness      Discussed follow up to be determined after  completion of EMG/NCV and neurosurgery evaluation. Recommend being seen by MD, he wishes to change providers, will see if another provider will accept and still plan on f/u after completion of above testing     CC:  Rema Fendt, NP    I spent 36 minutes of face-to-face and non-face-to-face time with patient.  This included previsit chart review, lab review, study review, order entry, electronic health record documentation, patient education and discussion regarding above diagnoses and treatment plan and answered all the questions to patient's satisfaction  Ihor Austin, [facility]  [facility] [address] [place], [place] [address]  Phone [phone] Fax [phone] Note: This document was prepared with digital dictation and possible smart phrase technology. Any transcriptional errors that result from this process are unintentional.

## 2022-06-02 NOTE — Patient Instructions (Addendum)
Your Plan:  Increase amitriptyline to 100mg  nightly for continued pains and headache prevention  Start naratriptan to take as needed for acute onset of migraines   Will place referral to neurosurgery - you will be called to schedule initial evaluation   You will be scheduled to complete EMG/NCV to further evaluate continued pains and numbness sensation        Thank you for coming to see [place] at [facility]. I hope we have been able to provide you high quality care today.  You may receive a patient satisfaction survey over the next few weeks. We would appreciate your feedback and comments so that we may continue to improve ourselves and the health of our patients.

## 2022-06-02 NOTE — Progress Notes (Deleted)
[facility]  PATIENT: Matthew Jennings DOB: [birth year]  REFERRING CLINICIAN: Camillia Herter, NP HISTORY FROM: patient  REASON FOR VISIT: Bell's Palsy, left-sided numbness   HISTORICAL  CHIEF COMPLAINT:  No chief complaint on file.   HISTORY OF PRESENT ILLNESS:    Update 06/02/2022 JM: Patient returns for 75-monthfollow-up.   Left-sided numbness and left hand weakness *** Was referred to orthopedics at prior visit but unable to find location to accept his insurance.    Migraines *** Use of amitriptyline ***        History provided for reference purposes only Update 11/25/2021 JM: Patient returns for 3109-monthollow-up unaccompanied.  Left arm weakness/numbness persist. Continued difficulty with using left hand. Does have left shoulder pain. Reports numbness/pain especially from elbow to outer hand. Continued neck pain. Can have difficulty sleeping at night.  Completed MR cervical spine which showed severe bilateral foraminal stenosis and disc bulging at C5-6 and C6-7, referral placed to neurosurgery but per patient, refused to see patient due to being covered under [facility].   Continued left leg numbness and imbalance. Continued b/l foot numbness.   Migraines still 2-3x per week but severe migraine about 1x per week which can be debilitating, did see much benefit after increasing amitriptyline dosage at prior visit. He does take nightly but has also been taking as needed for acute management in addition to naproxen. Migraines typically resolve after 30 to 60 minutes. Previously prescribed naratriptan as intolerance to rizatriptan and sumatriptan but reports he never received prescription.  Compliant on Plavix and atorvastatin. Denies any new stroke type symptoms. Blood pressure today 129/93.  Prior follow-up with PCP 6 months ago.  He is in the process of applying for Medicaid, reports needs to apply for disability first. Trying to collect all prior  medical information as advised by social security.   MRI cervical spine 09/16/2021 IMPRESSION:  - At C6-7 disc bulging and uncovertebral joint hypertrophy with moderate spinal stenosis and severe bilateral foraminal stenosis. No cord signal changes. - At C5-6 disc bulging and uncovertebral joint hypertrophy with mild spinal stenosis and severe bilateral foraminal stenosis. No cord signal changes. - At C4-5 right ward disc bulging and uncovertebral joint hypertrophy with severe right foraminal stenosis.     History provided for reference purposes only UPDATE 08/26/2021 JM: Returns for 6-59-monthllow-up unaccompanied.  Reports worsening of left hand weakness more so with digit 1 and 5, greater difficulty making a fist and dropping things, does have long standing hx of neck pain which has also been progressing, can radiate down in to his shoulder and down the back side of his left arm, usually worse in the morning. Also reports continued imbalance and left leg numbness. Continues bilateral foot numbness, bottom of feet, stable. Continued migraines approximately 2-3x per week, currently on amitriptyline 25 mg nightly, will use Maxalt which can help take the edge off but unable to take during the day due to fatigue, will use naproxen during the day if needed with benefit if he takes soon enough. Does not overuse naproxen. 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 Hx of Bell's palsy, resolved.  Remains on Plavix and atorvastatin, denies side effects.  Blood pressure today 137/87.  A1c 9.0 (05/2021) with PCP making adjustments to diabetic regimen. No further concerns at this time.   UPDATE 02/18/2021 JM: [name] for 6 months follow-up.  No residual facial weakness or reoccurring of symptoms.  Continued left  hand weakness with numbness (ring and pinky finger and occasional thumb) with difficulty picking up smaller objects and left leg numbness with leg giving out leading to  imbalance.  No AD and no falls. Denies any associated pain such as in neck, lower back, hip or knee.  Also reports occasional slurred speech especially when trying to speak quicker.  All symptoms persistent since 05/2020.  Denies new stroke/TIA symptoms.  Reports improvement of bilateral foot numbness as glucose levels improved (most recent A1c 7.4 down from 10.4).  Migraine headaches improved since prior visit - only took 1 month of amitriptyline as he was not aware he had refills.  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 He has been doing cooking at home (like he would at work) without difficulty.  Remains on atorvastatin 40 mg daily.  Blood pressure today 126/86.  No further concerns at this time.   Initial consult visit 08/20/2020 Dr. Leta Baptist: 57 year old male here for evaluation of Bell's palsy.  January 2022 patient had onset of left facial weakness, went to the ER for evaluation.  He was also having left hand numbness at the time and had MRI of the brain which showed enhancement of the left facial nerve.  No cause for left hand numbness was found.  Patient followed up with ENT and Bell's palsy symptoms have improved.  Patient continues to have left hand numbness and weakness, mainly affecting digits 1 and 5.  Patient also has bilateral toe and feet numbness.  Patient has new diagnosis of diabetes, to started medications about 1 month ago.  A1c is greater than 11.  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  Patient has history of migraine since age 68 years old with global intense squeezing throbbing sensation, sensitive to light and sound, triggered by weather changes.  Having 3 headaches per week.  He has tried Aleve and Keppra in the past without relief.    REVIEW OF SYSTEMS: Full 14 system review of systems performed and negative with exception of: as per HPI.   ALLERGIES: Allergies  Allergen  Reactions   Aspirin Other (See Comments)   Penicillins Rash    HOME MEDICATIONS: Outpatient Medications Prior to Visit  Medication Sig Dispense Refill   amitriptyline (ELAVIL) 75 MG tablet Take 1 tablet (75 mg total) by mouth at bedtime. 30 tablet 5   atorvastatin (LIPITOR) 40 MG tablet Take 1 tablet (40 mg total) by mouth daily. 90 tablet 1   Blood Glucose Monitoring Suppl (TRUE METRIX METER) w/Device KIT Use as directed 1 kit 0   clopidogrel (PLAVIX) 75 MG tablet Take 1 tablet (75 mg total) by mouth daily. 30 tablet 11   Dulaglutide (TRULICITY) 3 0000000 SOPN Inject 3 mg as directed once a week. 2 mL 0   empagliflozin (JARDIANCE) 25 MG TABS tablet Take 1 tablet (25 mg total) by mouth daily before breakfast. 30 tablet 2   glucose blood (TRUE METRIX BLOOD GLUCOSE TEST) test strip Use as instructed 100 each 12   Insulin Pen Needle 31G X 8 MM MISC Use as directed 100 each 0   metFORMIN (GLUCOPHAGE) 1000 MG tablet Take 1 tablet (1,000 mg total) by mouth 2 (two) times daily with a meal. 180 tablet 0   naproxen sodium (ALEVE) 220 MG tablet Take 220 mg by mouth.     naratriptan (AMERGE) 2.5 MG tablet Take 1 tablet (2.5 mg total)  by mouth as needed for migraine. Take one (1) tablet at onset of headache; if returns or does not resolve, may repeat after 4 hours; do not exceed five (5) mg in 24 hours. 10 tablet 5   Semaglutide, 2 MG/DOSE, 8 MG/3ML SOPN Inject 2 mg as directed once a week. 3 mL 2   No facility-administered medications prior to visit.    PAST MEDICAL HISTORY: Past Medical History:  Diagnosis Date   Bell's palsy    left   Diabetes mellitus without complication (HCC)    Facial paralysis/Bells palsy    Hx of migraines     PAST SURGICAL HISTORY: Past Surgical History:  Procedure Laterality Date   KNEE RECONSTRUCTION Left     FAMILY HISTORY: Family History  Problem Relation Age of Onset   Cancer Mother    Alzheimer's disease Father     SOCIAL HISTORY: Social History    Socioeconomic History   Marital status: Single    Spouse name: Not on file   Number of children: 0   Years of education: Not on file   Highest education level: Associate degree: occupational, Hotel manager, or vocational program  Occupational History    Comment: EMT  Tobacco Use   Smoking status: Never    Passive exposure: Yes   Smokeless tobacco: Never  Substance and Sexual Activity   Alcohol use: Yes    Comment: occ   Drug use: Not Currently   Sexual activity: Not Currently  Other Topics Concern   Not on file  Social History Narrative   Lives alone   Social Determinants of Health   Financial Resource Strain: Not on file  Food Insecurity: Not on file  Transportation Needs: Not on file  Physical Activity: Not on file  Stress: Not on file  Social Connections: Not on file  Intimate Partner Violence: Not on file     PHYSICAL EXAM  GENERAL EXAM/CONSTITUTIONAL: Vitals:  There were no vitals filed for this visit.    There is no height or weight on file to calculate BMI. Wt Readings from Last 3 Encounters:  04/01/22 160 lb (72.6 kg)  11/25/21 164 lb 8 oz (74.6 kg)  08/26/21 170 lb (77.1 kg)   Patient is in no distress; well developed, nourished and groomed; neck is supple  CARDIOVASCULAR: Examination of carotid arteries is normal; no carotid bruits Regular rate and rhythm, no murmurs Examination of peripheral vascular system by observation and palpation is normal  EYES: Ophthalmoscopic exam of optic discs and posterior segments is normal; no papilledema or hemorrhages  MUSCULOSKELETAL: Gait, strength, tone, movements noted in Neurologic exam below  NEUROLOGIC: MENTAL STATUS:  awake, alert, oriented to person, place and time recent and remote memory intact normal attention and concentration language fluent, comprehension intact, naming intact fund of knowledge appropriate  CRANIAL NERVE:  2nd - no papilledema on fundoscopic exam 2nd, 3rd, 4th, 6th -  pupils equal and reactive to light, visual fields full to confrontation, extraocular muscles intact, no nystagmus 5th - facial sensation symmetric 7th - facial strength symmetric 8th - hearing intact 9th - palate elevates symmetrically, uvula midline 11th - shoulder shrug symmetric 12th - tongue protrusion midline  MOTOR:  normal bulk and tone, full strength in the RUE and RLE; LUE 4/5 WEAKNESS THROUGHOUT AND LLE 4+/5 HF AND ADF, MILD ATROPHY AND WEAKNESS LEFT HAND INTRINSIC MUSCLES, DIFFICULTY MAKING COMPLETE FIST.   SENSORY:  normal symmetrically to vibration; DECREASED LIGHT TOUCH AND PP SENSATION LUE AND LLE AND DECR PP  IN LEFT HAND DIGIT 1 AND 5  COORDINATION:  finger-nose-finger mild incoordination of LUE, fine finger movements normal  REFLEXES:  deep tendon reflexes TRACE and symmetric  GAIT/STATION:  narrow based gait with mild imbalance with decreased LLE stride length and step height without use of assistive device. Able to stand from seated position with arms crossed.  Romberg positive     DIAGNOSTIC DATA (LABS, IMAGING, TESTING) - I reviewed patient records, labs, notes, testing and imaging myself where available.  Lab Results  Component Value Date   WBC 6.7 04/01/2022   HGB 14.0 04/01/2022   HCT 42.0 04/01/2022   MCV 85.2 04/01/2022   PLT 201 04/01/2022      Component Value Date/Time   NA 141 04/01/2022 1440   NA 144 05/30/2021 0927   K 4.1 04/01/2022 1440   CL 109 04/01/2022 1440   CO2 26 04/01/2022 1440   GLUCOSE 116 (H) 04/01/2022 1440   BUN 26 (H) 04/01/2022 1440   BUN 14 05/30/2021 0927   CREATININE 0.81 04/01/2022 1440   CALCIUM 9.4 04/01/2022 1440   PROT 8.3 (H) 05/17/2020 1416   ALBUMIN 4.6 05/17/2020 1416   AST 23 05/17/2020 1416   ALT 33 05/17/2020 1416   ALKPHOS 99 05/17/2020 1416   BILITOT 0.6 05/17/2020 1416   GFRNONAA >60 04/01/2022 1440   Lab Results  Component Value Date   CHOL 125 02/26/2021   HDL 45 02/26/2021   LDLCALC 64  02/26/2021   TRIG 81 02/26/2021   CHOLHDL 2.8 02/26/2021   Lab Results  Component Value Date   HGBA1C 9.0 (A) 05/30/2021   No results found for: "VITAMINB12" Lab Results  Component Value Date   TSH 1.720 07/06/2020    05/17/20 MRI brain / IAC  - Prominent asymmetric enhancement of the left seventh cranial nerve extending from the IAC fundus through the mastoid segment. Findings are compatible with Bell's palsy in the appropriate clinical setting. Should the patient's symptoms persist or follow an unexpected course, follow-up brain MRI with contrast and with IAC protocol recommended. - T2 hyperintensity and enhancement within the left mandibular condyle, nonspecific but possibly degenerative or inflammatory in etiology. Clinical correlation is recommended.  MRI CERVICAL SPINE WO CONTRAST 09/16/2021 IMPRESSION:  MRI cervical spine (without) demonstrating: - At C6-7 disc bulging and uncovertebral joint hypertrophy with moderate spinal stenosis and severe bilateral foraminal stenosis. No cord signal changes. - At C5-6 disc bulging and uncovertebral joint hypertrophy with mild spinal stenosis and severe bilateral foraminal stenosis. No cord signal changes. - At C4-5 right ward disc bulging and uncovertebral joint hypertrophy with severe right foraminal stenosis.        ASSESSMENT AND PLAN  57 y.o. year old male here with:  Dx:  No diagnosis found.      PLAN:   MILD LEFT SIDED WEAKNESS AND PARESTHESIAS -continued symptoms -Unknown etiology although possible small right sided stroke not seen on imaging -continue Plavix 75 mg daily (aspirin allergy) and atorvastatin 40 mg daily for stroke prevention measures -Declines interest in further stroke work-up  -Declines interest in EMG/NCV -Continue to follow with PCP for aggressive stroke risk factor management including HTN with BP goal<130/90, HLD with LDL goal<70 and DM with A1c goal<7.0 - advised to schedule f/u with PCP  as no recent f/u  -A1c 9.0 (05/2021) up from 7.4 - PCP made adjustments to diabetic regimen -LDL 64 (02/2021)   LEFT HAND NUMBNESS (likely peripheral neuropathy; carpal tunnel syndrome + ulnar neuropathy; in setting of  diabetic polyneuropathy) -reports continued gradual worsening with hand weakness, neck pain and left arm radiculopathy.  -MR cervical 09/16/2021 severe bilateral foraminal stenosis and disc bulging at C5-6 and C6-7 and severe right foraminal stenosis C4-5 -referral placed to neurosurgery but unable to be seen due to [place] coverage therefore will place referral to orthopedics -discussed possible need of EMG/NCS but will defer to ortho evaluation   MIGRAINE WITH AURA -reports 2-3 migraines per week which can last 2-12 hours -increase amitriptyline to 75 mg nightly -advised to start naratriptan for acute management  -Prior intolerance to rizatriptan and sumatriptan   GAIT IMPAIRMENT/IMBALANCE -Suspect multifactorial in setting of neuropathy, LLE weakness and possible cervical etiology -Declines interest in PT   LEFT BELL'S PALSY (resolved) - continue supportive care   No orders of the defined types were placed in this encounter.   No orders of the defined types were placed in this encounter.     No follow-ups on file.    CC:  Matthew Herter, NP    I spent 32 minutes of face-to-face and non-face-to-face time with patient.  This included previsit chart review, lab review, study review, order entry, electronic health record documentation, patient education and discussion regarding worsening left hand weakness and further evaluation, continued left-sided symptoms, further stroke prevention measures and importance of aggressive stroke risk factor management, history of Bell's palsy and migraine with aura and answered all other questions to patient satisfaction  Frann Rider, AGNP-BC  [facility] [address] [place], [place]  [address]  Phone [phone] Fax [phone] Note: This document was prepared with digital dictation and possible smart phrase technology. Any transcriptional errors that result from this process are unintentional.

## 2022-06-02 NOTE — Telephone Encounter (Signed)
Referral for Neurosurgery fax to [facility]. Phone: [phone], Fax: [phone]

## 2022-06-03 ENCOUNTER — Telehealth: Payer: Self-pay

## 2022-06-03 LAB — TSH: TSH: 3.01 u[IU]/mL (ref 0.450–4.500)

## 2022-06-03 LAB — VITAMIN B12: Vitamin B-12: 529 pg/mL (ref 232–1245)

## 2022-06-03 LAB — HEMOGLOBIN A1C
Est. average glucose Bld gHb Est-mCnc: 126 mg/dL
Hgb A1c MFr Bld: 6 % — ABNORMAL HIGH (ref 4.8–5.6)

## 2022-06-03 NOTE — Telephone Encounter (Signed)
-----  Message from Frann Rider, NP sent at 06/03/2022  7:57 AM EST ----- Please advise patient that his recent A1c currently satisfactory at 6.0 down from 9.0 1 year ago, B12 and TSH levels WNL. Please ensure he contact his PCP office to schedule routine f/u as he has not been seen in over the past year. Thank you.

## 2022-06-03 NOTE — Telephone Encounter (Signed)
Contacted pt LVM informing him recent A1c currently satisfactory at 6.0 down from 9.0 1 year ago, B12 and TSH levels WNL. Advised to Please ensure he contact his PCP office to schedule routine f/u as he has not been seen in over the past year. Number provided to call back with questions.

## 2022-06-03 NOTE — Telephone Encounter (Signed)
Pt called back. I informed him that nurse left him a message and if he had any questions to please call back.

## 2022-06-12 NOTE — Addendum Note (Signed)
Addended by: Frann Rider L on: 06/12/2022 07:43 AM   Modules accepted: Orders

## 2022-06-16 DIAGNOSIS — M4802 Spinal stenosis, cervical region: Secondary | ICD-10-CM | POA: Diagnosis not present

## 2022-07-14 ENCOUNTER — Other Ambulatory Visit: Payer: Self-pay

## 2022-07-17 ENCOUNTER — Other Ambulatory Visit: Payer: Self-pay

## 2022-07-17 ENCOUNTER — Ambulatory Visit (INDEPENDENT_AMBULATORY_CARE_PROVIDER_SITE_OTHER): Payer: Medicaid Other | Admitting: Neurology

## 2022-07-17 ENCOUNTER — Telehealth: Payer: Self-pay | Admitting: Adult Health

## 2022-07-17 VITALS — BP 134/101 | HR 100 | Ht 70.0 in | Wt 157.0 lb

## 2022-07-17 DIAGNOSIS — R200 Anesthesia of skin: Secondary | ICD-10-CM

## 2022-07-17 DIAGNOSIS — R29898 Other symptoms and signs involving the musculoskeletal system: Secondary | ICD-10-CM

## 2022-07-17 DIAGNOSIS — G5622 Lesion of ulnar nerve, left upper limb: Secondary | ICD-10-CM | POA: Diagnosis not present

## 2022-07-17 DIAGNOSIS — M5412 Radiculopathy, cervical region: Secondary | ICD-10-CM

## 2022-07-17 DIAGNOSIS — R531 Weakness: Secondary | ICD-10-CM

## 2022-07-17 MED ORDER — AMITRIPTYLINE HCL 100 MG PO TABS: 100.0000 mg | ORAL_TABLET | Freq: Every day | ORAL | 1 refills | Status: DC

## 2022-07-17 MED ORDER — NARATRIPTAN HCL 2.5 MG PO TABS: 2.5000 mg | ORAL_TABLET | ORAL | 5 refills | Status: DC | PRN

## 2022-07-17 NOTE — Telephone Encounter (Signed)
I will send the refill to the pharmacy as requested. The amerge has to be done one month at a time but the other I entered as a 3 mth supply

## 2022-07-17 NOTE — Patient Instructions (Addendum)
Polyneuropathy: control diabetes, -May consider daily alpha lipoic acid which is an antioxidant that may reduce free radical oxidative stress associated with diabetic polyneuropathy, existing evidence suggests that alpha lipoic acid significantly reduces stabbing, lancinating and burning pain and diabetic neuropathy with its onset of action as early as 1-2 weeks. 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 MRI cervical spine 09/16/2021: See Dr. Marcello Moores and physical therapy call above    Polyneuropathy feet  -May consider daily alpha lipoic acid which is an antioxidant that may reduce free radical oxidative stress associated with diabetic polyneuropathy, existing evidence suggests that alpha lipoic acid significantly reduces stabbing, lancinating and burning pain and diabetic neuropathy with its onset of action as early as 1-2 weeks. '600mg'$  twice daily.   2. Bilat left > right       Ulnar neuropathy Syndrome    Cubital tunnel syndrome is a condition that causes pain and weakness of the forearm and hand. It happens when one of the nerves that runs along the inside of the elbow joint (ulnar nerve) becomes irritated. This condition is usually caused by repeated arm motions that are done during sports or work-related activities. What are the causes? This condition may be caused by: Increased pressure on the ulnar nerve at the elbow, arm, or forearm. This can result from: Irritation caused by repeated elbow bending. Poorly healed elbow fractures. Tumors in the elbow. These are usually noncancerous (benign). Scar tissue that develops in the elbow after an injury. Bony growths (spurs) near the ulnar nerve. Stretching of the nerve due to loose elbow ligaments. Trauma to the nerve at the elbow. What  increases the risk? The following factors may make you more likely to develop this condition: Doing manual labor that requires frequent bending of the elbow. Playing sports that include repeated or strenuous throwing motions, such as baseball. Playing contact sports, such as football or lacrosse. Not warming up properly before activities. Having diabetes. Having an underactive thyroid (hypothyroidism). What are the signs or symptoms? Symptoms of this condition include: Clumsiness or weakness of the hand. Tenderness of the inner elbow. Aching or soreness of the inner elbow, forearm, or fingers, especially the little finger or the ring finger. Increased pain when forcing the elbow to bend. Reduced control when throwing objects. Tingling, numbness, or a burning feeling inside the forearm or in part of the hand or fingers, especially the little finger or the ring finger. Sharp pains that shoot from the elbow down to the wrist and hand. The inability to grip or pinch hard. How is this diagnosed? This condition is diagnosed based on: Your symptoms and medical history. Your health care provider will also ask for details about any injury. A physical exam. You may also have tests, including: Electromyogram (EMG). This test measures electrical signals sent by your nerves into the muscles. Nerve conduction study. This test measures how well electrical signals pass through your nerves. Imaging tests, such as X-rays, ultrasound, and MRI. These tests check for possible causes of your condition. How is this treated? This condition may be treated by: Stopping the activities that are causing your symptoms to get worse. Icing and taking medicines to reduce pain and swelling. Wearing a splint to prevent your elbow from bending, or wearing an elbow pad where the ulnar nerve is closest  to the skin. Working with a physical therapist in less severe cases. This may help to: Decrease your symptoms. Improve  the strength and range of motion of your elbow, forearm, and hand. If these treatments do not help, surgery may be needed. Follow these instructions at home: If you have a splint: Wear the splint as told by your health care provider. Remove it only as told by your health care provider. Loosen the splint if your fingers tingle, become numb, or turn cold and blue. Keep the splint clean. If the splint is not waterproof: Do not let it get wet. Cover it with a watertight covering when you take a bath or shower. Managing pain, stiffness, and swelling  If directed, put ice on the injured area: Put ice in a plastic bag. Place a towel between your skin and the bag. Leave the ice on for 20 minutes, 2-3 times a day. Move your fingers often to avoid stiffness and to lessen swelling. Raise (elevate) the injured area above the level of your heart while you are sitting or lying down. General instructions Take over-the-counter and prescription medicines only as told by your health care provider. Do any exercise or physical therapy as told by your health care provider. Do not drive or use heavy machinery while taking prescription pain medicine. If you were given an elbow pad, wear it as told by your health care provider. Keep all follow-up visits as told by your health care provider. This is important. Contact a health care provider if: Your symptoms get worse. Your symptoms do not get better with treatment. You have new pain. Your hand on the injured side feels numb or cold. Summary Cubital tunnel syndrome is a condition that causes pain and weakness of the forearm and hand. You are more likely to develop this condition if you do work or play sports that involve repeated arm movements. This condition is often treated by stopping repetitive activities, applying ice, and using anti-inflammatory medicines. In rare cases, surgery may be needed. This information is not intended to replace advice given  to you by your health care provider. Make sure you discuss any questions you have with your health care provider.  Left arm Cervical Radiculopathy  C5-6 disc bulging and uncovertebral joint hypertrophy with mild spinal stenosis and severe bilateral foraminal stenosis C6-7 disc bulging and uncovertebral joint hypertrophy with moderate spinal stenosis and severe bilateral foraminal stenosis   Cervical radiculopathy happens when a nerve in the neck (a cervical nerve) is pinched or bruised. This condition can happen because of an injury to the cervical spine (vertebrae) in the neck, or as part of the normal aging process. Pressure on the cervical nerves can cause pain or numbness that travels from the neck all the way down to the arm and fingers. This condition usually gets better with rest. Treatment may be needed if the condition does not improve. What are the causes? This condition may be caused by: A neck injury. A bulging (herniated) disk. Muscle spasms. Muscle tightness in the neck due to overuse. Arthritis. Breakdown or degeneration in the bones and joints of the spine (spondylosis) due to aging. Bone spurs that may develop near the cervical nerves. What are the signs or symptoms? Symptoms of this condition include: Pain. The pain may travel from the neck to the arm and hand. The pain can be severe or irritating. It may get worse when you move your neck. Numbness or tingling in your arm or hand. Weakness in the  affected arm and hand, in severe cases. How is this diagnosed? This condition may be diagnosed based on your symptoms, your medical history, and a physical exam. You may also have tests, including: X-rays. CT scan. MRI. Electromyogram (EMG). Nerve conduction tests. How is this treated? In many cases, treatment is not needed for this condition. With rest, the condition usually gets better over time. If treatment is needed, options may include: Wearing a soft neck collar  (cervical collar) for short periods of time. Doing physical therapy to strengthen your neck muscles. Taking medicines. These may include NSAIDs, such as ibuprofen, or oral corticosteroids. Having spinal injections, in severe cases. Having surgery. This may be needed if other treatments do not help. Different types of surgery may be done depending on the cause of this condition. Follow these instructions at home: If you have a cervical collar: Wear it as told by your health care provider. Remove it only as told by your health care provider. Ask your health care provider if you can remove the cervical collar for cleaning and bathing. If you are allowed to remove the collar for cleaning or bathing: Follow instructions from your health care provider about how to remove the collar safely. Clean the collar by wiping it with mild soap and water and drying it completely. Take out any removable pads in the collar every 1-2 days, and wash them by hand with soap and water. Let them air-dry completely before you put them back in the collar. Check your skin under the collar for irritation or sores. If you see any, tell your health care provider. Managing pain     Take over-the-counter and prescription medicines only as told by your health care provider. If directed, put ice on the affected area. To do this: If you have a soft neck collar, remove it as told by your health care provider. Put ice in a plastic bag. Place a towel between your skin and the bag. Leave the ice on for 20 minutes, 2-3 times a day. Remove the ice if your skin turns bright red. This is very important. If you cannot feel pain, heat, or cold, you have a greater risk of damage to the area. If applying ice does not help, you can try using heat. Use the heat source that your health care provider recommends, such as a moist heat pack or a heating pad. Place a towel between your skin and the heat source. Leave the heat on for 20-30  minutes. Remove the heat if your skin turns bright red. This is especially important if you are unable to feel pain, heat, or cold. You have a greater risk of getting burned. Try a gentle neck and shoulder massage to help relieve symptoms. Activity Rest as needed. Return to your normal activities as told by your health care provider. Ask your health care provider what activities are safe for you. Do stretching and strengthening exercises as told by your health care provider or your physical therapist. You may have to avoid lifting. Ask your health care provider how much you can safely lift. General instructions Use a flat pillow when you sleep. Do not drive while wearing a cervical collar. If you do not have a cervical collar, ask your health care provider if it is safe to drive while your neck heals. Ask your health care provider if the medicine prescribed to you requires you to avoid driving or using machinery. Do not use any products that contain nicotine or tobacco. These products  include cigarettes, chewing tobacco, and vaping devices, such as e-cigarettes. If you need help quitting, ask your health care provider. Keep all follow-up visits. This is important. Contact a health care provider if: Your condition does not improve with treatment. Get help right away if: Your pain gets much worse and is not controlled with medicines. You have weakness or numbness in your hand, arm, face, or leg. You have a high fever. You have a stiff, rigid neck. You lose control of your bowels or your bladder (have incontinence). You have trouble with walking, balance, or speaking. Summary Cervical radiculopathy happens when a nerve in the neck is pinched or bruised. A nerve can get pinched from a bulging disk, arthritis, muscle spasms, or an injury to the neck. Symptoms include pain, tingling, or numbness radiating from the neck to the arm or hand. Weakness can also occur in severe cases. Treatment may  include rest, wearing a cervical collar, and physical therapy. Medicines may be prescribed to help with pain. In severe cases, injections or surgery may be needed. This information is not intended to replace advice given to you by your health care provider. Make sure you discuss any questions you have with your health care provider. Document Revised: 11/01/2020 Document Reviewed: 11/01/2020 Elsevier Patient Education  [place] Revised: 06/19/2020 Document Reviewed: 06/20/2020 Elsevier Patient Education  [place].

## 2022-07-17 NOTE — Addendum Note (Signed)
Addended by: Darleen Crocker on: 07/17/2022 12:09 PM   Modules accepted: Orders

## 2022-07-17 NOTE — Progress Notes (Signed)
History of uncontrolled DM now controlled. Numbness in the left side since 2020 after a possible stroke. Problems with left arm and left leg. Numbness below the left knee to the feet. Right also has the same symptoms, legs symmetrical as far as the sensory changes go. As far as the left upper goes he has pain radiating into the left pinky and ring finger. Also on separate occassions also the thumb and index fingers. He states he also has cervical stenosis at [facility] he sees Dr. Marcello Moores. No low back pain or lumbar radiculopathy. Right arm is largely asymptomatic.   - control diabetes, -May consider daily alpha lipoic acid which is an antioxidant that may reduce free radical oxidative stress associated with diabetic polyneuropathy, existing evidence suggests that alpha lipoic acid significantly reduces stabbing, lancinating and burning pain and diabetic neuropathy with its onset of action as early as 1-2 weeks. '600mg'$  twice daily  - Ulnar neuropathy: PT (ask PT about ulnar neuropathy) may need OT [facility]; Physical therapist [place], [place]  [phone] PT for cervical radiculopaty and OT for ulnar neuropathy  MRI cervical spine 09/16/2021: See Dr. Marcello Moores    FINDINGS:    On sagittal views the vertebral bodies have normal height and alignment.  The spinal cord is normal in size and appearance. The posterior fossa, pituitary gland and paraspinal soft tissues are unremarkable.     On axial views: C2-3 no spinal stenosis or foraminal narrowing C3-4 no spinal stenosis or foraminal narrowing C4-5 right ward disc bulging and uncovertebral joint hypertrophy with severe right foraminal stenosis C5-6 disc bulging and uncovertebral joint hypertrophy with mild spinal stenosis and severe bilateral foraminal stenosis C6-7 disc bulging and uncovertebral joint hypertrophy with moderate spinal stenosis and severe bilateral foraminal stenosis C7-T1 no spinal stenosis or  foraminal narrowing

## 2022-07-17 NOTE — Telephone Encounter (Signed)
Pt just got Medicaid so he can now have the Amitriptyline filled and the Naratriptan filled. He wants you to send this pharmacy please. [address], [place],  [address]  [phone]  Needs in 3 month increments if you can

## 2022-07-22 NOTE — Progress Notes (Signed)
Full Name: Matthew Jennings Gender: Male MRN #: [id number] Date of Birth: 1965-12-17    Visit Date: 07/17/2022 10:55 Age: 57 Years Examining Physician: Dr. Sarina Ill Referring Physician: Frann Rider, NP Height: 5 feet 10 inch  History: History of uncontrolled DM now controlled. Numbness in the left side since 2020 after a possible stroke. Problems with left arm and left leg. Numbness below the left knee to the foot. Right foot also has the same symptoms, legs symmetrical as far as the sensory changes go. As far as the left upper goes he has pain radiating into the left pinky and ring finger. Also on separate occassions also the thumb and index fingers. He states he also has cervical stenosis at [facility] he sees Dr. Marcello Moores. No low back pain or lumbar radiculopathy. Right arm less affected in pinky.   Summary: Nerve Conduction Studies were performed on the bilateral upper extremities and left lower extremity  The right ulnar ADM showed delayed distal onset latency prethree (3.7 ms, normal less than 3.3) and reduced amplitude (5.7 mV, normal greater than 6). The left ulnar motor nerve when recording from the ADM showed delayed distal onset latency (3.7 ms, normal less than 3.3). The left ulnar motor nerve when recording from the FDI showed delayed distal onset latency (5.6 ms, normal less than 4.5) and reduced amplitude (3.7 mV, normal greater than 7), decreased amplitude (below elbow to wrist, 39 m/s, normal greater than 49) and decreased amplitude (37 m/s, normal greater than 49). The left peroneal motor nerve showed no response. The left tibial motor nerve showed no response.  The right Median 2nd Digit orthodromic sensory nerve showed  reduced amplitude(3 uV, N>10) The left  Median 2nd Digit orthodromic sensory nerve showed  reduced amplitude(3 uV, N>10) The left radial sensory nerve showed delayed distal peak latency (3.4 ms, normal less than 2.9) and reduced amplitude (4  V, normal greater than 15) The right radial sensory nerve showed reduced amplitude (3 V, normal greater than 15). The left orthodromiic ulnar sensory nerve showed decreased amplitude (5 V, normal greater than 6). The right ulnar orthodromic sensory nerve showed no response. F Wave studies indicate that the left ulnar F wave has delayed latency(36.6, N<53ms). All remaining nerves (as indicated in the following tables) were within normal limits.   The left extensor houses longus muscle showed increased spontaneous activity, prolonged motor unit duration, polyphasic motor units and diminished motor unit recruitment, the left triceps showed polyphasic motor units and diminished motor unit recruitment, the left biceps brachii showed polyphasic motor units and diminished motor unit recruitment, the rigleft ht first dorsal interosseous showed increased spontaneous activity, prolonged motor unit duration, polyphasic motor units and diminished motor unit recruitment.  The left flexor digitorum profundus showed polyphasic motor units and diminished motor unit recruitment, the left hand ADM showed increased spontaneous activity, prolonged motor unit duration, polyphasic motor units and diminished motor unit recruitment.All remaining muscles (as indicated in the following tables) were within normal limits.     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 Although I cannot localize it to the elbow, this is the most likely location. Clinical correlation suggested and possible orthopaedic referral. 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  There is mild upper extremity polyneuropathy.  There are chronic neurogenic changes in left muscles that share c6 innervation which is consistent with MRI  cervical spine showing C6-7 disc bulging and uncovertebral joint hypertrophy with moderate spinal stenosis and severe  bilateral foraminal stenosis. Recommend referral to neurosurgery Recommend referral to neurosurgery for clinical correlation of acute/ongoing cervical radiculopathy despite no acute denervation seen because of patient's clinical complaints. ------------------------------- Sarina Ill, M.D.  [facility] [address], [place],  [address] Tel: [phone] Fax: [phone]  Verbal informed consent was obtained from the patient, patient was informed of potential risk of procedure, including bruising, bleeding, hematoma formation, infection, muscle weakness, muscle pain, numbness, among others.        [place]    Nerve / Sites Muscle Latency Ref. Amplitude Ref. Rel Amp Segments Distance Velocity Ref. Area    ms ms mV mV %  cm m/s m/s mVms  L Median - APB     Wrist APB 3.6 ?4.4 7.2 ?4.0 100 Wrist - APB 7   35.9     Upper arm APB 9.6  6.9  96.2 Upper arm - Wrist 28 47 ?49 37.5     Palm APB 1.9  5.5  80.2 Palm - APB    20.4  R Median - APB     Wrist APB 4.2 ?4.4 9.0 ?4.0 100 Wrist - APB 7   38.0     Upper arm APB 10.5  9.9  110 Upper arm - Wrist 30.5 49 ?49 41.9  L Ulnar - ADM     Wrist ADM 3.7 ?3.3 7.4 ?6.0 100 Wrist - ADM 7   28.7     B.Elbow ADM 6.4  7.1  95.8 B.Elbow - Wrist 12 45 ?49 27.8     A.Elbow ADM 11.1  6.6  93.8 A.Elbow - B.Elbow 21 45 ?49 27.9  R Ulnar - ADM     Wrist ADM 3.7 ?3.3 5.7 ?6.0 100 Wrist - ADM 7.5   20.4     B.Elbow ADM 7.3  6.7  117 B.Elbow - Wrist 14 45 ?49 23.8     A.Elbow ADM 11.3  6.3  95 A.Elbow - B.Elbow 18 45 ?49 24.6  L Peroneal - EDB     Ankle EDB NR ?6.5 NR ?2.0 NR Ankle - EDB 9   NR         Pop fossa - Ankle      L Tibial - AH     Ankle AH NR ?5.8 NR ?4.0 NR Ankle - AH 9   NR  L Ulnar - FDI     Wrist FDI 5.6 ?4.5 3.7 ?7.0 100 Wrist - FDI 8   9.8     B.Elbow FDI 8.7  3.5  94.9 B.Elbow - Wrist 12 39 ?49 11.2     A.Elbow FDI 13.5  3.1  88.9 A.Elbow - B.Elbow 18 37 ?49 11.3     ADM FDI 3.8  2.7  87.5 ADM - A.Elbow     8.6         A.Elbow - Wrist                       SNC    Nerve / Sites Rec. Site Peak Lat Ref.  Amp Ref. Segments Distance    ms ms V V  cm  L Radial - Anatomical snuff box (Forearm)     Forearm Wrist 3.4 ?2.9 4 ?15 Forearm - Wrist 10  R Radial - Anatomical snuff box (Forearm)     Forearm Wrist 2.9 ?2.9 3 ?15 Forearm - Wrist 10  L Sural - Ankle (Calf)     Calf Ankle NR ?4.4 NR ?6 Calf - Ankle 14  L Median - Orthodromic (Dig II, Mid palm)     Dig II Wrist 2.8 ?3.4 3 ?10 Dig II - Wrist 13  R Median - Orthodromic (Dig II, Mid palm)     Dig II Wrist 2.9 ?3.4 3 ?10 Dig II - Wrist 13  L Ulnar - Orthodromic, (Dig V, Mid palm)     Dig V Wrist 2.9 ?3.1 5 ?6 Dig V - Wrist 11  R Ulnar - Orthodromic, (Dig V, Mid palm)     Dig V Wrist NR ?3.1 NR ?5 Dig V - Wrist 84                   F  Wave    Nerve F Lat Ref.   ms ms  L Ulnar - ADM 36.6 ?32.0       EMG Summary Table    Spontaneous MUAP Recruitment  Muscle IA Fib PSW Fasc Other Amp Dur. Poly Pattern  L. Vastus medialis Normal None None None _______ could not tolerate Normal Normal Normal  L. Tibialis anterior Normal None None None _______ Normal Normal Normal Normal  L. Gastrocnemius (Medial head) Normal None None None _______ Normal Normal Normal Normal  L. Extensor hallucis longus Normal None 3+ None _______ Normal Increased 3+ Reduced  L. Abductor hallucis Could not tolerate          L. Cervical paraspinals (low) Normal None None None _______ Normal Normal Normal Normal  L. Deltoid Normal None None None _______ Normal Normal Normal Normal  L. Triceps brachii Normal None None None _______ Normal Normal 2+ Reduced  L. Biceps brachii Normal None None None _______ Normal Normal 3+ Reduced  L. First dorsal interosseous Normal None 3+ None _______ Normal Increased 3+ Reduced  L. Flexor digitorum profundus (Median) Normal None None None _______ Normal Increased 4+ Reduced  L. Pronator teres Normal None None None _______ Normal Normal  Normal Normal  L Extensor indicis proprius Normal None None None _______ Normal Normal Normal Normal  L. Opponens pollicis Normal None None None _______ Normal Normal Normal Normal  L. Abductor digiti minimi (manus) Normal None 2+ None _______ Normal Increased 4+ Reduced

## 2022-07-28 ENCOUNTER — Telehealth: Payer: Self-pay | Admitting: Adult Health

## 2022-07-28 ENCOUNTER — Other Ambulatory Visit: Payer: Self-pay | Admitting: Adult Health

## 2022-07-28 ENCOUNTER — Telehealth: Payer: Self-pay | Admitting: Neurology

## 2022-07-28 DIAGNOSIS — G5623 Lesion of ulnar nerve, bilateral upper limbs: Secondary | ICD-10-CM

## 2022-07-28 NOTE — Addendum Note (Signed)
Addended by: Sarina Ill B on: 07/28/2022 03:27 PM   Modules accepted: Level of Service

## 2022-07-28 NOTE — Telephone Encounter (Signed)
Referral for neurosurgery fax to [facility] [place].  Phone: [phone], Fax: [phone] 

## 2022-07-28 NOTE — Telephone Encounter (Signed)
Called the patient and reviewed the results from the nerve conduction study. Advised that he should be getting a call from NS and ortho. Pt verbalized understanding. Pt had no questions at this time but was encouraged to call back if questions arise. Pt wanted to make sure the medications had been sent to [facility]. Advised the patient that both were sent in on 3/7 to the pharmacy as requested. Pt was appreciative.

## 2022-07-28 NOTE — Progress Notes (Signed)
History: History of uncontrolled DM now controlled. Numbness in the left side since 2020 after a possible stroke. Problems with left arm and left leg. Numbness below the left knee to the foot. Right foot also has the same symptoms, legs symmetrical as far as the sensory changes go. As far as the left upper goes he has pain radiating into the left pinky and ring finger. Also on separate occassions also the thumb and index fingers. He states he also has cervical stenosis at [facility] he sees Dr. Marcello Moores. No low back pain or lumbar radiculopathy. Right arm less affected.     I reviewed emg/ncs today with patient. His emg is consistent with his symptoms and with his MRI cervical spine(see below). He had uncontrolled diabetes most likely the cause of his polyneuropathy. The ulnar neuropathy is consistent with his complaints of digits 4-5 numbness with radiation from the elbow. 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 Sent to neurosurgery. Showed pictures online, explained pathophysiology, conservative and other treatments. See emg/ncs.    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 Although I cannot localize it to the elbow, this is the most likely location. Clinical correlation suggested and possible referral. 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  There is mild upper extremity polyneuropathy.  There are chronic neurogenic changes in left muscles that share c6 innervation which is consistent with MRI cervical spine showing C6-7 disc bulging and uncovertebral joint hypertrophy with moderate spinal stenosis and severe bilateral foraminal stenosis. Recommend referral to neurosurgery for clinical correlation of acute/ongoing cervical radiculopathy despite no acute denervation seen because of patient's clinical  complaints.  MRI cervical spine, reviewed images and agree:  FINDINGS:    On sagittal views the vertebral bodies have normal height and alignment.  The spinal cord is normal in size and appearance. The posterior fossa, pituitary gland and paraspinal soft tissues are unremarkable.     On axial views: C2-3 no spinal stenosis or foraminal narrowing C3-4 no spinal stenosis or foraminal narrowing C4-5 right ward disc bulging and uncovertebral joint hypertrophy with severe right foraminal stenosis C5-6 disc bulging and uncovertebral joint hypertrophy with mild spinal stenosis and severe bilateral foraminal stenosis C6-7 disc bulging and uncovertebral joint hypertrophy with moderate spinal stenosis and severe bilateral foraminal stenosis C7-T1 no spinal stenosis or foraminal narrowing   Limited views of the soft tissues of the head and neck are unremarkable.       IMPRESSION:    MRI cervical spine (without) demonstrating: - At C6-7 disc bulging and uncovertebral joint hypertrophy with moderate spinal stenosis and severe bilateral foraminal stenosis. No cord signal changes. - At C5-6 disc bulging and uncovertebral joint hypertrophy with mild spinal stenosis and severe bilateral foraminal stenosis. No cord signal changes. - At C4-5 right ward disc bulging and uncovertebral joint hypertrophy with severe right foraminal stenosis.  Orders Placed This Encounter  Procedures   Ambulatory referral to Physical Therapy   Ambulatory referral to Occupational Therapy   Ambulatory referral to Neurosurgery   I spent over 40 minutes of face-to-face and non-face-to-face time with patient on the  1. Ulnar neuropathy at elbow of left upper extremity   2. Cervical radiculopathy   3. Left hand weakness   4. Left-sided weakness   5. Left sided numbness    diagnosis.  This included previsit chart review, lab  review, study review, order entry, electronic health record documentation, patient education on the  different diagnostic and therapeutic options, counseling and coordination of care, risks and benefits of management, compliance, or risk factor reduction. This does not include time spent on emg/ncs.

## 2022-07-28 NOTE — Telephone Encounter (Signed)
-----   Message from Frann Rider, NP sent at 07/28/2022  1:15 PM EDT ----- Please contact patient as requested - EMG/NCV showed severe ulnar neuropathy on the left which is likely contributing to left arm complaints and moderately severe on the right, will place referral to orthopedics for further recommendations.  Referral already placed to neurosurgery regarding cervical radiculopathy which was also shown on this testing with chronic neurogenic changes in left muscles that share C6 innervation.  This study also showed severe distal lower extremity axonal polyneuropathy, recommend continued use of amitriptyline for symptom management. Thank you.

## 2022-07-28 NOTE — Telephone Encounter (Signed)
Referral for orthopedic fax to [facility].  Phone: [phone], Fax: [phone].

## 2022-07-28 NOTE — Procedures (Signed)
Full Name: Matthew Jennings Gender: Male MRN #: [id number] Date of Birth: 08-10-1965    Visit Date: 07/17/2022 10:55 Age: 57 Years Examining Physician: Dr. Sarina Ill Referring Physician: Frann Rider, NP Height: 5 feet 10 inch  History: History of uncontrolled DM now controlled. Numbness in the left side since 2020 after a possible stroke. Problems with left arm and left leg. Numbness below the left knee to the foot. Right foot also has the same symptoms, legs symmetrical as far as the sensory changes go. As far as the left upper goes he has pain radiating into the left pinky and ring finger. Also on separate occassions also the thumb and index fingers. He states he also has cervical stenosis at [place] [facility] he sees Dr. Marcello Moores. No low back pain or lumbar radiculopathy. Right arm less affected in the pinky.  Summary: Nerve Conduction Studies were performed on the bilateral upper extremities and left lower extremity  The right ulnar ADM showed delayed distal onset latency prethree (3.7 ms, normal less than 3.3) and reduced amplitude (5.7 mV, normal greater than 6). The left ulnar motor nerve when recording from the ADM showed delayed distal onset latency (3.7 ms, normal less than 3.3). The left ulnar motor nerve when recording from the FDI showed delayed distal onset latency (5.6 ms, normal less than 4.5) and reduced amplitude (3.7 mV, normal greater than 7), decreased amplitude (below elbow to wrist, 39 m/s, normal greater than 49) and decreased amplitude (37 m/s, normal greater than 49). The left peroneal motor nerve showed no response. The left tibial motor nerve showed no response.  The right Median 2nd Digit orthodromic sensory nerve showed  reduced amplitude(3 uV, N>10) The left  Median 2nd Digit orthodromic sensory nerve showed  reduced amplitude(3 uV, N>10) The left radial sensory nerve showed delayed distal peak latency (3.4 ms, normal less than 2.9) and reduced amplitude  (4 V, normal greater than 15) The right radial sensory nerve showed reduced amplitude (3 V, normal greater than 15). The left orthodromiic ulnar sensory nerve showed decreased amplitude (5 V, normal greater than 6). The right ulnar orthodromic sensory nerve showed no response. F Wave studies indicate that the left ulnar F wave has delayed latency(36.6, N<67ms). All remaining nerves (as indicated in the following tables) were within normal limits.   The left extensor houses longus muscle showed increased spontaneous activity, prolonged motor unit duration, polyphasic motor units and diminished motor unit recruitment, the left triceps showed polyphasic motor units and diminished motor unit recruitment, the left biceps brachii showed polyphasic motor units and diminished motor unit recruitment, the rigleft ht first dorsal interosseous showed increased spontaneous activity, prolonged motor unit duration, polyphasic motor units and diminished motor unit recruitment.  The left flexor digitorum profundus showed polyphasic motor units and diminished motor unit recruitment, the left hand ADM showed increased spontaneous activity, prolonged motor unit duration, polyphasic motor units and diminished motor unit recruitment.All remaining muscles (as indicated in the following tables) were within normal limits.     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 Although I cannot localize it to the elbow, this is the most likely location. Clinical correlation suggested and possible orthopaedic referral. 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  There is mild upper extremity polyneuropathy.  There are chronic neurogenic changes in left muscles that share c6 innervation which is consistent with MRI  cervical spine showing C6-7 disc bulging and uncovertebral joint hypertrophy with moderate spinal stenosis and severe  bilateral foraminal stenosis. Recommend referral to neurosurgery Recommend referral to neurosurgery for clinical correlation of acute/ongoing cervical radiculopathy despite no acute denervation seen because of patient's clinical complaints.  ------------------------------- Sarina Ill, M.D.  [facility] [address] [place], [place] [address] Tel: [phone] Fax: [phone]  Verbal informed consent was obtained from the patient, patient was informed of potential risk of procedure, including bruising, bleeding, hematoma formation, infection, muscle weakness, muscle pain, numbness, among others.        [place]    Nerve / Sites Muscle Latency Ref. Amplitude Ref. Rel Amp Segments Distance Velocity Ref. Area    ms ms mV mV %  cm m/s m/s mVms  L Median - APB     Wrist APB 3.6 ?4.4 7.2 ?4.0 100 Wrist - APB 7   35.9     Upper arm APB 9.6  6.9  96.2 Upper arm - Wrist 28 47 ?49 37.5     Palm APB 1.9  5.5  80.2 Palm - APB    20.4  R Median - APB     Wrist APB 4.2 ?4.4 9.0 ?4.0 100 Wrist - APB 7   38.0     Upper arm APB 10.5  9.9  110 Upper arm - Wrist 30.5 49 ?49 41.9  L Ulnar - ADM     Wrist ADM 3.7 ?3.3 7.4 ?6.0 100 Wrist - ADM 7   28.7     B.Elbow ADM 6.4  7.1  95.8 B.Elbow - Wrist 12 45 ?49 27.8     A.Elbow ADM 11.1  6.6  93.8 A.Elbow - B.Elbow 21 45 ?49 27.9  R Ulnar - ADM     Wrist ADM 3.7 ?3.3 5.7 ?6.0 100 Wrist - ADM 7.5   20.4     B.Elbow ADM 7.3  6.7  117 B.Elbow - Wrist 14 45 ?49 23.8     A.Elbow ADM 11.3  6.3  95 A.Elbow - B.Elbow 18 45 ?49 24.6  L Peroneal - EDB     Ankle EDB NR ?6.5 NR ?2.0 NR Ankle - EDB 9   NR         Pop fossa - Ankle      L Tibial - AH     Ankle AH NR ?5.8 NR ?4.0 NR Ankle - AH 9   NR  L Ulnar - FDI     Wrist FDI 5.6 ?4.5 3.7 ?7.0 100 Wrist - FDI 8   9.8     B.Elbow FDI 8.7  3.5  94.9 B.Elbow - Wrist 12 39 ?49 11.2     A.Elbow FDI 13.5  3.1  88.9 A.Elbow - B.Elbow 18 37 ?49 11.3     ADM FDI 3.8  2.7  87.5 ADM - A.Elbow     8.6         A.Elbow - Wrist                       SNC    Nerve / Sites Rec. Site Peak Lat Ref.  Amp Ref. Segments Distance    ms ms V V  cm  L Radial - Anatomical snuff box (Forearm)     Forearm Wrist 3.4 ?2.9 4 ?15 Forearm - Wrist 10  R Radial - Anatomical snuff box (Forearm)     Forearm Wrist 2.9 ?2.9 3 ?15 Forearm - Wrist 10  L Sural - Ankle (Calf)     Calf Ankle NR ?4.4 NR ?6 Calf - Ankle 14  L Median - Orthodromic (Dig II, Mid palm)     Dig II Wrist 2.8 ?3.4 3 ?10 Dig II - Wrist 13  R Median - Orthodromic (Dig II, Mid palm)     Dig II Wrist 2.9 ?3.4 3 ?10 Dig II - Wrist 13  L Ulnar - Orthodromic, (Dig V, Mid palm)     Dig V Wrist 2.9 ?3.1 5 ?6 Dig V - Wrist 11  R Ulnar - Orthodromic, (Dig V, Mid palm)     Dig V Wrist NR ?3.1 NR ?5 Dig V - Wrist 84                   F  Wave    Nerve F Lat Ref.   ms ms  L Ulnar - ADM 36.6 ?32.0       EMG Summary Table    Spontaneous MUAP Recruitment  Muscle IA Fib PSW Fasc Other Amp Dur. Poly Pattern  L. Vastus medialis Normal None None None _______ could not tolerate Normal Normal Normal  L. Tibialis anterior Normal None None None _______ Normal Normal Normal Normal  L. Gastrocnemius (Medial head) Normal None None None _______ Normal Normal Normal Normal  L. Extensor hallucis longus Normal None 3+ None _______ Normal Increased 3+ Reduced  L. Abductor hallucis Could not tolerate          L. Cervical paraspinals (low) Normal None None None _______ Normal Normal Normal Normal  L. Deltoid Normal None None None _______ Normal Normal Normal Normal  L. Triceps brachii Normal None None None _______ Normal Normal 2+ Reduced  L. Biceps brachii Normal None None None _______ Normal Normal 3+ Reduced  L. First dorsal interosseous Normal None 3+ None _______ Normal Increased 3+ Reduced  L. Flexor digitorum profundus (Median) Normal None None None _______ Normal Increased 4+ Reduced  L. Pronator teres Normal None None None _______ Normal Normal  Normal Normal  L Extensor indicis proprius Normal None None None _______ Normal Normal Normal Normal  L. Opponens pollicis Normal None None None _______ Normal Normal Normal Normal  L. Abductor digiti minimi (manus) Normal None 2+ None _______ Normal Increased 4+ Reduced

## 2022-07-28 NOTE — Addendum Note (Signed)
Addended by: Sarina Ill B on: 07/28/2022 12:42 PM   Modules accepted: Level of Service

## 2022-08-05 ENCOUNTER — Telehealth: Payer: Self-pay | Admitting: Neurology

## 2022-08-05 ENCOUNTER — Encounter: Payer: Self-pay | Admitting: Occupational Therapy

## 2022-08-05 ENCOUNTER — Ambulatory Visit: Payer: Medicaid Other | Attending: Neurology | Admitting: Occupational Therapy

## 2022-08-05 DIAGNOSIS — R278 Other lack of coordination: Secondary | ICD-10-CM | POA: Insufficient documentation

## 2022-08-05 DIAGNOSIS — M6281 Muscle weakness (generalized): Secondary | ICD-10-CM | POA: Insufficient documentation

## 2022-08-05 DIAGNOSIS — G5622 Lesion of ulnar nerve, left upper limb: Secondary | ICD-10-CM | POA: Diagnosis not present

## 2022-08-05 DIAGNOSIS — M79602 Pain in left arm: Secondary | ICD-10-CM | POA: Insufficient documentation

## 2022-08-05 NOTE — Telephone Encounter (Signed)
PA completed on [facility] KEY: [id number] Will await determination

## 2022-08-06 NOTE — Telephone Encounter (Signed)
PA denied  "For a non-preferred drug, you have tried two preferred drugs: rizatriptan orally disintegrating tablet, rizatriptan tablet, sumatriptan nasal spray, sumatriptan tablet, sumatriptan vial. The information provided does not show that you meet the criteria listed above. Please speak with your doctor about your choices. This decision was made per the [place] Triptans Guideline"  [place] line to discuss why denied since the pt has tried and failed sumatriptan and rizatriptan. This information was submitted on the PA. Spoke with Tillie Rung and she states I need to send in office notes. I have forwarded the office notes and marked as urgent to the fax # provided.

## 2022-08-06 NOTE — Therapy (Signed)
[facility] [address] [place], [address] Phone: [phone]   Fax:  [phone]  Occupational Therapy Evaluation  Patient Details  Name: Matthew Jennings MRN: [id number] Date of Birth: October 21, 1965 Referring Provider (OT): Sarina Ill   Encounter Date: 08/05/2022   OT End of Session - 08/06/22 1653     Visit Number 1    Number of Visits 8    Date for OT Re-Evaluation 09/02/22    OT Start Time 1030    OT Stop Time 1115    OT Time Calculation (min) 45 min    Activity Tolerance Patient tolerated treatment well    Behavior During Therapy [facility] for tasks assessed/performed             Past Medical History:  Diagnosis Date   Bell's palsy    left   Diabetes mellitus without complication ([place])    Facial paralysis/Bells palsy    Hx of migraines     Past Surgical History:  Procedure Laterality Date   KNEE RECONSTRUCTION Left     There were no vitals filed for this visit.   Subjective Assessment - 08/05/22 1037     Subjective  Pt reports in 2020 he had bell's palsy and stroke, he started having increased migraines, weakness in left side, numbness in leg arm and foot, balance issues.  All numbness, tingling, pins and needles started on the left but now feels like it is spreading to right side as well.  Difficulty with sleeping positions and neck position.  More recently had a nerve conduction study.  Saw Dr. Marcello Moores at neurosurgery who referred him for hand therapy.    Pertinent History Per MD note:  History of uncontrolled DM now controlled. Numbness in the left side since 2020 after a possible stroke. Problems with left arm and left leg. Numbness below the left knee to the feet. Right also has the same symptoms, legs symmetrical as far as the sensory changes go. As far as the left upper goes he has pain radiating into the left pinky and ring finger. Also on separate occassions also the thumb and index fingers. He  states he also has cervical stenosis at [place] [facility] he sees Dr. Marcello Moores. No low back pain or lumbar radiculopathy. Right arm is largely asymptomatic.  Results of nerve conduction study:  Conclusion: 1. 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 Although I cannot localize it to the elbow, this is the most likely location. Clinical correlation suggested and possible orthopaedic referral. There is no Carpal Tunnel Syndrome  2. 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  There is mild upper extremity polyneuropathy.   3. There are chronic neurogenic changes in left muscles that share c6 innervation which is consistent with MRI cervical spine showing C6-7 disc bulging and uncovertebral joint hypertrophy with moderate spinal stenosis and severe bilateral foraminal stenosis. Recommend referral to neurosurgery Recommend referral to neurosurgery for clinical correlation of acute/ongoing cervical radiculopathy despite no acute denervation seen because of patient's clinical complaints.    Patient Stated Goals Pt would like to be able to move better without pain and be able to sleep.    Currently in Pain? Yes    Pain Score 4     Pain Location Neck    Pain Orientation Posterior    Pain Descriptors / Indicators Sharp;Stabbing;Shooting    Pain Type Chronic pain    Pain Onset More than  a month ago    Pain Frequency Intermittent    Multiple Pain Sites No               [facility] OT Assessment - 08/06/22 1658       Assessment   Medical Diagnosis ulnar neuropathy    Referring Provider (OT) Sarina Ill    Hand Dominance Right      Balance Screen   Has the patient fallen in the past 6 months No      Home  Environment   Family/patient expects to be discharged to: Private residence    Living Arrangements Alone    Available Help at Discharge Family    Type of [place] One level    Bathroom Shower/Tub  Tub/Shower unit;Curtain      Prior Function   Level of [place] Unemployed    Leisure computer use, 3 D printing      ADL   ADL comments Pt reports difficulty at times with grasping and grip on utensils, holding cup, performing buttons, snaps, zippers.  Tying shoes is difficult.  Tends to wear pull on shirts and pants for easeHe reports he normally does a lot of cooking but drops items frequently now. He has difficulty with sleeping and finding a position comfortable to sleep.      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    Medication Management Is responsible for taking medication in correct dosages at correct time      Mobility   Mobility Status Comments ambulates without assistive device      Written Expression   Dominant Hand Right      Sensation   Light Touch Impaired by gross assessment    Hot/Cold Appears Intact      AROM   Right Shoulder Flexion 160 Degrees    Right Shoulder ABduction 170 Degrees    Left Shoulder Flexion 70 Degrees    Left Shoulder ABduction 60 Degrees    Right Elbow Flexion 140    Right Elbow Extension 0    Left Elbow Flexion 140    Left Elbow Extension 0    Right Forearm Pronation 90 Degrees    Right Forearm Supination 90 Degrees    Left Forearm Pronation 80 Degrees    Left Forearm Supination 80 Degrees    Right Wrist Extension 70 Degrees    Right Wrist Flexion 70 Degrees    Left Wrist Extension 70 Degrees    Left Wrist Flexion 45 Degrees      Strength   Right Shoulder Flexion 4/5    Right Shoulder ABduction 4/5    Left Shoulder Flexion --   pain and lacks full motion   Left Shoulder ABduction --   pain and lacks full motion   Right Hand Grip (lbs) 65    Right Hand Lateral Pinch 17 lbs    Right Hand 3 Point Pinch 15 lbs    Left Hand Grip (lbs) 33    Left Hand Lateral Pinch 9 lbs    Left Hand 3  Point Pinch 9 lbs            Pt with negative tinel's on right, positive on left  Discussed sleeping position, pt reports he sleeps on his back with his arms crossed in  front of him at his waist.  He uses 3 pillows, 2 which are flatter and another more supportive under his neck.  He reports difficulty with finding a position which is comfortable to sleep.    Pt reports when sitting, he has arm on armrest at times but reports numbness on right and left, worse on left.  Discussed positioning at home and avoiding propping of arms/elbows to avoid compression at ulnar nerve.    Pt reports difficulty at times with holding utensils, issued red foam built up handle for utensil and toothbrush.  Left thumb impaired for light touch Right hand light touch intact.    OT Education - 08/06/22 1657     Education Details goals, POC, role of OT    Person(s) Educated Patient    Methods Explanation    Comprehension Verbalized understanding              OT Short Term Goals - 08/06/22 1807       OT SHORT TERM GOAL #1   Title Pt to demonstrate understanding of home exercise program.    Baseline no current program    Time 4    Period Weeks    Status New    Target Date 09/03/22               OT Long Term Goals - 08/06/22 1808       OT LONG TERM GOAL #1   Title Pt will demonstrate modifications to tasks to complete ADL and IADL tasks with modified independence.    Baseline eval:  difficulty with dressing skills, cutting food.    Time 4    Period Weeks    Status New    Target Date 09/03/22      OT LONG TERM GOAL #2   Title Pt will complete buttons, snaps and zippers with modified independence.    Baseline Eval:  difficulty with buttons, snaps and zippers.    Time 4    Period Weeks    Status New    Target Date 09/03/22      OT LONG TERM GOAL #3   Title Pt will demonstrate understanding of positioning of UEs to avoid further compression of ulnar nerve    Baseline Eval:   limited knowledge    Time 4    Period Weeks    Status New    Target Date 09/03/22      OT LONG TERM GOAL #4   Title Pt will demonstrate improved grip on left by 10# to decrease dropping of items    Baseline Eval:  dropping items frequently.    Time 4    Period Weeks    Status New    Target Date 09/03/22                   Plan - 08/06/22 1655     Clinical Impression Statement Pt is a 57 yo male with history of CVA, diabetes, cervical neuropathy and UE and LE neuropathy.  Pt referred to Occupational Therapy for ulnar neuropathy bilaterally.  Pt presents with left sided pain, sensory impairments with numbness and tingling on left UE and LE and reports symptoms worsening and moving into right side in the last few months, increased pain, decreased UE ROM, strength and coordination.   He reports increased difficulty with self care tasks such as managing buttons, snaps, zippers as well as IADL tasks such as cooking and cleaning.  Pt would benefit from skilled OT services to  maximize safety and independence in necessary daily tasks.    OT Occupational Profile and History Detailed Assessment- Review of Records and additional review of physical, cognitive, psychosocial history related to current functional performance    Occupational performance deficits (Please refer to evaluation for details): ADL's;IADL's;Leisure    Body Structure / Function / Physical Skills ADL;Edema;Flexibility;ROM;UE functional use;FMC;Pain;Strength;Coordination;Dexterity;IADL;Sensation    Psychosocial Skills Environmental  Adaptations;Habits;Routines and Behaviors    Rehab Potential Fair    Clinical Decision Making Several treatment options, min-mod task modification necessary    Comorbidities Affecting Occupational Performance: May have comorbidities impacting occupational performance    Modification or Assistance to Complete Evaluation  No modification of tasks or assist necessary to complete eval    OT Frequency  2x / week    OT Duration 4 weeks    OT Treatment/Interventions Self-care/ADL training;Fluidtherapy;Therapeutic exercise;Therapeutic activities;Moist Heat;Cryotherapy;Ultrasound;Paraffin;Contrast Bath;Neuromuscular education;DME and/or AE instruction;Manual Therapy;Passive range of motion;Splinting;Patient/family education    Consulted and Agree with Plan of Care Patient             Patient will benefit from skilled therapeutic intervention in order to improve the following deficits and impairments:   Body Structure / Function / Physical Skills: ADL, Edema, Flexibility, ROM, UE functional use, FMC, Pain, Strength, Coordination, Dexterity, IADL, Sensation   Psychosocial Skills: Environmental  Adaptations, Habits, Routines and Behaviors   Visit Diagnosis: Muscle weakness (generalized)  Other lack of coordination  Pain in left arm    Problem List Patient Active Problem List   Diagnosis Date Noted   Diabetic neuropathy associated with type 2 diabetes mellitus ([place]) 11/30/2020   Numbness and tingling in left hand 11/22/2020   Hearing loss of right ear due to cerumen impaction 08/10/2020   Hearing loss of left ear 08/10/2020   Balance problem 08/10/2020   Type 2 diabetes mellitus ([place]) 07/10/2020   Hyperlipidemia 07/10/2020   Bell's palsy 07/07/2020   Elevated blood-pressure reading without diagnosis of hypertension 07/07/2020   Shaley Leavens T Tomasita Morrow, OTR/L, CLT  Sajid Ruppert, OT 08/06/2022, 6:16 PM  [facility] [address], [place], [address] Phone: [phone]   Fax:  [phone]  Name: Matthew Jennings MRN: [id number] Date of Birth: [birth year]

## 2022-08-12 ENCOUNTER — Encounter (HOSPITAL_BASED_OUTPATIENT_CLINIC_OR_DEPARTMENT_OTHER): Payer: Self-pay

## 2022-08-12 DIAGNOSIS — E119 Type 2 diabetes mellitus without complications: Secondary | ICD-10-CM | POA: Diagnosis not present

## 2022-08-12 DIAGNOSIS — K130 Diseases of lips: Secondary | ICD-10-CM | POA: Diagnosis not present

## 2022-08-12 DIAGNOSIS — Z794 Long term (current) use of insulin: Secondary | ICD-10-CM | POA: Insufficient documentation

## 2022-08-12 NOTE — Progress Notes (Signed)
Patient ID: Matthew Jennings, male    DOB: [birth year]  MRN: [id number]  CC: Follow-Up  Subjective: Matthew Jennings is a 57 y.o. male who presents for follow-up.   His concerns today include:  - Needs refills on diabetes medications. Reports he has been several weeks without medications due to needing refills. He is not checking his blood sugars outside of office. He denies red flag symptoms associated with diabetes.  - Needs refills on cholesterol medication, no issues/concerns.  - Established with Neurology.  - Established with Neurosurgery.  - Established with Orthopedics.  - Established with Physical Therapy.  - No further issues/concerns for discussion today.   Patient Active Problem List   Diagnosis Date Noted   Diabetic neuropathy associated with type 2 diabetes mellitus 11/30/2020   Numbness and tingling in left hand 11/22/2020   Hearing loss of right ear due to cerumen impaction 08/10/2020   Hearing loss of left ear 08/10/2020   Balance problem 08/10/2020   Type 2 diabetes mellitus 07/10/2020   Hyperlipidemia 07/10/2020   Bell's palsy 07/07/2020   Elevated blood-pressure reading without diagnosis of hypertension 07/07/2020     Current Outpatient Medications on File Prior to Visit  Medication Sig Dispense Refill   amitriptyline (ELAVIL) 100 MG tablet Take 1 tablet (100 mg total) by mouth at bedtime. 90 tablet 1   clopidogrel (PLAVIX) 75 MG tablet Take 1 tablet (75 mg total) by mouth daily. 30 tablet 11   doxycycline (VIBRAMYCIN) 100 MG capsule Take 1 capsule (100 mg total) by mouth 2 (two) times daily. One po bid x 7 days 14 capsule 0   naproxen sodium (ALEVE) 220 MG tablet Take 220 mg by mouth.     naratriptan (AMERGE) 2.5 MG tablet Take 1 tablet (2.5 mg total) by mouth as needed for migraine. Take one (1) tablet at onset of headache; if returns or does not resolve, may repeat after 4 hours; do not exceed five (5) mg in 24 hours. 10 tablet 5   No current  facility-administered medications on file prior to visit.    Allergies  Allergen Reactions   Aspirin Other (See Comments)   Penicillins Rash    Social History   Socioeconomic History   Marital status: Single    Spouse name: Not on file   Number of children: 0   Years of education: Not on file   Highest education level: Associate degree: occupational, Scientist, product/process development, or vocational program  Occupational History    Comment: EMT  Tobacco Use   Smoking status: Never    Passive exposure: Yes   Smokeless tobacco: Never  Substance and Sexual Activity   Alcohol use: Yes    Comment: occ   Drug use: Not Currently   Sexual activity: Not Currently  Other Topics Concern   Not on file  Social History Narrative   Lives alone   Social Determinants of Health   Financial Resource Strain: Not on file  Food Insecurity: Not on file  Transportation Needs: Not on file  Physical Activity: Not on file  Stress: Not on file  Social Connections: Not on file  Intimate Partner Violence: Not on file    Family History  Problem Relation Age of Onset   Cancer Mother    Alzheimer's disease Father     Past Surgical History:  Procedure Laterality Date   KNEE RECONSTRUCTION Left     ROS: Review of Systems Negative except as stated above  PHYSICAL EXAM: BP 133/77   Pulse  76   Temp 98.1 F (36.7 C)   Resp 14   Ht 5\' 10"  (1.778 m)   Wt 163 lb (73.9 kg)   SpO2 97%   BMI 23.39 kg/m   Physical Exam HENT:     Head: Normocephalic and atraumatic.  Eyes:     Extraocular Movements: Extraocular movements intact.     Conjunctiva/sclera: Conjunctivae normal.     Pupils: Pupils are equal, round, and reactive to light.  Cardiovascular:     Rate and Rhythm: Normal rate and regular rhythm.     Pulses: Normal pulses.     Heart sounds: Normal heart sounds.  Pulmonary:     Effort: Pulmonary effort is normal.     Breath sounds: Normal breath sounds.  Musculoskeletal:     Cervical back: Normal  range of motion and neck supple.  Neurological:     General: No focal deficit present.     Mental Status: He is alert and oriented to person, place, and time.  Psychiatric:        Mood and Affect: Mood normal.        Behavior: Behavior normal.    Results for orders placed or performed in visit on 08/18/22  POCT glycosylated hemoglobin (Hb A1C)  Result Value Ref Range   Hemoglobin A1C     HbA1c POC (<> result, manual entry)     HbA1c, POC (prediabetic range)     HbA1c, POC (controlled diabetic range) 10.5 (A) 0.0 - 7.0 %    ASSESSMENT AND PLAN: 1. Type 2 diabetes mellitus without complication, without long-term current use of insulin - Hemoglobin A1c not at goal at 10.5%, goal 7%. This is increased compared to previous 6.0%.  - Patient declined insulin therapy as of present.  - Continue Metformin as prescribed.  - Continue Empagliflozin as prescribed.  - Continue Semaglutide as prescribed.  - Sitagliptin as prescribed. Counseled on medication adherence. - Discussed the importance of healthy eating habits, low-carbohydrate diet, low-sugar diet, regular aerobic exercise (at least 150 minutes a week as tolerated) and medication compliance to achieve or maintain control of diabetes. - Follow-up with primary provider in 4 weeks or sooner if needed.  - POCT glycosylated hemoglobin (Hb A1C) - metFORMIN (GLUCOPHAGE) 1000 MG tablet; Take 1 tablet (1,000 mg total) by mouth 2 (two) times daily with a meal.  Dispense: 180 tablet; Refill: 0 - empagliflozin (JARDIANCE) 25 MG TABS tablet; Take 1 tablet (25 mg total) by mouth daily before breakfast.  Dispense: 90 tablet; Refill: 0 - sitaGLIPtin (JANUVIA) 25 MG tablet; Take 1 tablet (25 mg total) by mouth daily.  Dispense: 30 tablet; Refill: 0 - Semaglutide, 2 MG/DOSE, 8 MG/3ML SOPN; Inject 2 mg as directed once a week.  Dispense: 9 mL; Refill: 0 - glucose blood (TRUE METRIX BLOOD GLUCOSE TEST) test strip; Use as instructed  Dispense: 100 each;  Refill: 12 - Blood Glucose Monitoring Suppl (TRUE METRIX METER) w/Device KIT; Use as directed  Dispense: 1 kit; Refill: 0 - Insulin Pen Needle 31G X 8 MM MISC; Use as directed  Dispense: 100 each; Refill: 0 - TRUEplus Lancets 28G MISC; Use as directed  Dispense: 100 each; Refill: 4  2. Hyperlipidemia, unspecified hyperlipidemia type - Continue Atorvastatin as prescribed.  - Follow-up with primary provider as scheduled.  - atorvastatin (LIPITOR) 40 MG tablet; Take 1 tablet (40 mg total) by mouth daily.  Dispense: 90 tablet; Refill: 0   Patient was given the opportunity to ask questions.  Patient verbalized understanding  of the plan and was able to repeat key elements of the plan. Patient was given clear instructions to go to Emergency Department or return to medical center if symptoms don't improve, worsen, or new problems develop.The patient verbalized understanding.   Orders Placed This Encounter  Procedures   POCT glycosylated hemoglobin (Hb A1C)     Requested Prescriptions   Signed Prescriptions Disp Refills   atorvastatin (LIPITOR) 40 MG tablet 90 tablet 0    Sig: Take 1 tablet (40 mg total) by mouth daily.   metFORMIN (GLUCOPHAGE) 1000 MG tablet 180 tablet 0    Sig: Take 1 tablet (1,000 mg total) by mouth 2 (two) times daily with a meal.   empagliflozin (JARDIANCE) 25 MG TABS tablet 90 tablet 0    Sig: Take 1 tablet (25 mg total) by mouth daily before breakfast.   sitaGLIPtin (JANUVIA) 25 MG tablet 30 tablet 0    Sig: Take 1 tablet (25 mg total) by mouth daily.   Semaglutide, 2 MG/DOSE, 8 MG/3ML SOPN 9 mL 0    Sig: Inject 2 mg as directed once a week.   glucose blood (TRUE METRIX BLOOD GLUCOSE TEST) test strip 100 each 12    Sig: Use as instructed   Blood Glucose Monitoring Suppl (TRUE METRIX METER) w/Device KIT 1 kit 0    Sig: Use as directed   Insulin Pen Needle 31G X 8 MM MISC 100 each 0    Sig: Use as directed   TRUEplus Lancets 28G MISC 100 each 4    Sig: Use as  directed    Return in about 4 weeks (around 09/15/2022) for Follow-Up or next available chronic care mgmt .  Rema Fendt, NP

## 2022-08-12 NOTE — ED Triage Notes (Signed)
Pt c/o "some sort of rash or something" under nose, states he feels there is "some kinda boil behind it & it's making it hard for me to breathe." Naproxen for pain, states it didn't help.

## 2022-08-13 ENCOUNTER — Emergency Department (HOSPITAL_BASED_OUTPATIENT_CLINIC_OR_DEPARTMENT_OTHER)
Admission: EM | Admit: 2022-08-13 | Discharge: 2022-08-13 | Disposition: A | Discharge status: Home / Self Care | Payer: Medicaid Other | Attending: Emergency Medicine | Admitting: Emergency Medicine

## 2022-08-13 ENCOUNTER — Encounter (HOSPITAL_BASED_OUTPATIENT_CLINIC_OR_DEPARTMENT_OTHER): Payer: Self-pay | Admitting: Emergency Medicine

## 2022-08-13 ENCOUNTER — Other Ambulatory Visit: Payer: Self-pay

## 2022-08-13 DIAGNOSIS — K130 Diseases of lips: Secondary | ICD-10-CM

## 2022-08-13 HISTORY — DX: Type 2 diabetes mellitus without complications: E11.9

## 2022-08-13 MED ORDER — DOXYCYCLINE HYCLATE 100 MG PO TABS
100.0000 mg | ORAL_TABLET | Freq: Once | ORAL | Status: AC
  Administered 2022-08-13: 100 mg via ORAL
  Filled 2022-08-13: qty 1

## 2022-08-13 MED ORDER — DOXYCYCLINE HYCLATE 100 MG PO CAPS: 100.0000 mg | ORAL_CAPSULE | Freq: Two times a day (BID) | ORAL | 0 refills | Status: DC

## 2022-08-13 MED ORDER — LIDOCAINE-EPINEPHRINE (PF) 2 %-1:200000 IJ SOLN
10.0000 mL | Freq: Once | INTRAMUSCULAR | Status: AC
  Administered 2022-08-13: 10 mL via INTRADERMAL
  Filled 2022-08-13: qty 20

## 2022-08-13 NOTE — ED Provider Notes (Signed)
[facility] Provider Note: Matthew Spurling, MD, FACEP  CSN: [id number] MRN: [id number] ARRIVAL: 08/12/22 at [place]: [facility]  Abscess   HISTORY OF PRESENT ILLNESS  08/13/22 1:53 AM Matthew Jennings is a 57 y.o. male who had an ingrown hair on his upper lip 5 days ago.  He thought he successfully pulled it out but since then his upper lip and lower nose have become swollen and tender.  The swelling is making it difficult for him to breathe.   Past Medical History:  Diagnosis Date   Bell's palsy    left   DM (diabetes mellitus)    Facial paralysis/Bells palsy    Hx of migraines     Past Surgical History:  Procedure Laterality Date   KNEE RECONSTRUCTION Left     Family History  Problem Relation Age of Onset   Cancer Mother    Alzheimer's disease Father     Social History   Tobacco Use   Smoking status: Never    Passive exposure: Yes   Smokeless tobacco: Never  Substance Use Topics   Alcohol use: Yes    Comment: occ   Drug use: Not Currently    Prior to Admission medications   Medication Sig Start Date End Date Taking? Authorizing Provider  doxycycline (VIBRAMYCIN) 100 MG capsule Take 1 capsule (100 mg total) by mouth 2 (two) times daily. One po bid x 7 days 08/13/22  Yes Amand Lemoine, MD  amitriptyline (ELAVIL) 100 MG tablet Take 1 tablet (100 mg total) by mouth at bedtime. 07/17/22   Frann Rider, NP  atorvastatin (LIPITOR) 40 MG tablet Take 1 tablet (40 mg total) by mouth daily. 08/02/21 06/02/22  Charlott Rakes, MD  Blood Glucose Monitoring Suppl (TRUE METRIX METER) w/Device KIT Use as directed 07/10/20   Camillia Herter, NP  clopidogrel (PLAVIX) 75 MG tablet Take 1 tablet (75 mg total) by mouth daily. 08/02/21   Charlott Rakes, MD  empagliflozin (JARDIANCE) 25 MG TABS tablet Take 1 tablet (25 mg total) by mouth daily before breakfast. 06/10/21   Charlott Rakes, MD  glucose blood (TRUE METRIX BLOOD GLUCOSE TEST) test strip Use as  instructed 11/29/20   Charlott Rakes, MD  Insulin Pen Needle 31G X 8 MM MISC Use as directed 05/30/21   Camillia Herter, NP  metFORMIN (GLUCOPHAGE) 1000 MG tablet Take 1 tablet (1,000 mg total) by mouth 2 (two) times daily with a meal. 08/02/21 06/02/22  Charlott Rakes, MD  naproxen sodium (ALEVE) 220 MG tablet Take 220 mg by mouth.    [provider]  naratriptan (AMERGE) 2.5 MG tablet Take 1 tablet (2.5 mg total) by mouth as needed for migraine. Take one (1) tablet at onset of headache; if returns or does not resolve, may repeat after 4 hours; do not exceed five (5) mg in 24 hours. 07/17/22   Frann Rider, NP  Semaglutide, 2 MG/DOSE, 8 MG/3ML SOPN Inject 2 mg as directed once a week. 02/13/22   Charlott Rakes, MD    Allergies Aspirin and Penicillins   REVIEW OF SYSTEMS  Negative except as noted here or in the History of Present Illness.   PHYSICAL EXAMINATION  Initial Vital Signs Blood pressure (!) 162/112, pulse 87, temperature 98.7 F (37.1 C), temperature source Oral, resp. rate 19, height 5\' 11"  (1.803 m), weight 73.9 kg, SpO2 99 %.  Examination General: Well-developed, well-nourished male in no acute distress; appearance consistent with age of record HENT: normocephalic;  atraumatic; tender, swollen, fluctuant area of lower nose and upper lip:    Eyes: Normal appearance Neck: supple Heart: regular rate and rhythm Lungs: clear to auscultation bilaterally Abdomen: soft; nondistended; nontender; bowel sounds present Extremities: No deformity; full range of motion Neurologic: Awake, alert and oriented; motor function intact in all extremities and symmetric; no facial droop Skin: Warm and dry Psychiatric: Normal mood and affect   RESULTS  Summary of this visit's results, reviewed and interpreted by myself:   EKG Interpretation  Date/Time:    Ventricular Rate:    PR Interval:    QRS Duration:   QT Interval:    QTC Calculation:   R Axis:     Text  Interpretation:         Laboratory Studies: No results found for this or any previous visit (from the past 24 hour(s)). Imaging Studies: No results found.  ED COURSE and MDM  Nursing notes, initial and subsequent vitals signs, including pulse oximetry, reviewed and interpreted by myself.  Vitals:   08/12/22 1902 08/12/22 1904 08/13/22 0042 08/13/22 0043  BP:  (!) 172/98  (!) 162/112  Pulse:  (!) 110  87  Resp:  18  19  Temp:  98.2 F (36.8 C) 98.7 F (37.1 C)   TempSrc:   Oral   SpO2:  99%  99%  Weight: 73.9 kg     Height: 5\' 11"  (1.803 m)      Medications  doxycycline (VIBRA-TABS) tablet 100 mg (has no administration in time range)  lidocaine-EPINEPHrine (XYLOCAINE W/EPI) 2 %-1:200000 (PF) injection 10 mL (10 mLs Intradermal Given 08/13/22 0204)    I&D of the patient's upper lip abscess performed as described below.  We will start him on doxycycline as there appears to be a significant cellulitic component as well.  After the procedure the patient stated he felt significant relief of the pressure he was feeling in his mouth.  PROCEDURES  Procedures INCISION AND DRAINAGE Performed by: Karen Chafe Rodolfo Gaster Consent: Verbal consent obtained. Risks and benefits: risks, benefits and alternatives were discussed Type: abscess  Body area: Upper lip  Anesthesia: local infiltration  Incision was made with a scalpel.  Local anesthetic: lidocaine 2% with epinephrine  Anesthetic total: 1 ml  Complexity: complex Blunt dissection to break up loculations  Drainage: purulent  Drainage amount: Small  Packing material: None  Patient tolerance: Patient tolerated the procedure well with no immediate complications.    ED DIAGNOSES     ICD-10-CM   1. Abscess of lip  K13.0          Jorian Willhoite, Jenny Reichmann, MD 08/13/22 [phone]

## 2022-08-14 ENCOUNTER — Ambulatory Visit: Payer: Medicaid Other | Attending: Neurology | Admitting: Occupational Therapy

## 2022-08-14 ENCOUNTER — Other Ambulatory Visit: Payer: Self-pay

## 2022-08-14 DIAGNOSIS — M6281 Muscle weakness (generalized): Secondary | ICD-10-CM | POA: Insufficient documentation

## 2022-08-14 DIAGNOSIS — M542 Cervicalgia: Secondary | ICD-10-CM | POA: Insufficient documentation

## 2022-08-14 DIAGNOSIS — R278 Other lack of coordination: Secondary | ICD-10-CM | POA: Insufficient documentation

## 2022-08-14 DIAGNOSIS — R208 Other disturbances of skin sensation: Secondary | ICD-10-CM | POA: Insufficient documentation

## 2022-08-14 DIAGNOSIS — M79602 Pain in left arm: Secondary | ICD-10-CM | POA: Insufficient documentation

## 2022-08-14 NOTE — Therapy (Signed)
[facility] [address], [place], [address] Phone: [phone]   Fax:  [phone]  Occupational Therapy Screen  Patient Details  Name: Matthew Jennings MRN: [id number] Date of Birth: 1965/11/21 Referring Provider (OT): Sarina Ill   Encounter Date: 08/14/2022   OT End of Session - 08/14/22 1052     Visit Number 0             Past Medical History:  Diagnosis Date   Bell's palsy    left   DM (diabetes mellitus)    Facial paralysis/Bells palsy    Hx of migraines     Past Surgical History:  Procedure Laterality Date   KNEE RECONSTRUCTION Left     There were no vitals filed for this visit.   Subjective Assessment - 08/14/22 1048     Subjective  I am so confuse with my appointments - were I refered to you by Dr Marcello Moores or the neurologist - I have appt at [facility] also next week - who sees me for my neck therapy    Currently in Pain? Yes    Pain Score 5     Pain Location --   NEck and shoulders   Pain Descriptors / Indicators Sharp;Shooting    Pain Type Chronic pain               Patient arrived this date-upon asking patient about medical history, follow-up with neurosurgery as well as symptoms. Patient report seeing Dr. Marcello Moores in 2 weeks again. Patient has a physical therapy order of 07/09/2022 but was not seen or do not have any appointment with PT. 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  OT referral 07/17/2022 by neurology. 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 Observed patient keeping left shoulder elevated while discussing history.  Upon assessment of wrist motion and cueing patient to drop left shoulder patient reported increased numbness in fourth and  fifth digit. Patient has normal abduction of fifth digit as well as opposition with 4+/5 strength.  Lateral pinch with no Froment's sign's Negative Phalen's Patient do show decreased grip strength as well as sensory changes in fifth and fourth. Discussed with patient that would recommend OT after patient has possible cervical surgery in future for strengthening.  Symptoms coming more from cervical.  Will hold off on OT. Appear patient has 08/19/2022 appointment with [facility] PA -not physical therapy. Patient scheduled for a physical therapy evaluation on Monday 08/18/22                     OT Short Term Goals - 08/06/22 1807       OT SHORT TERM GOAL #1   Title Pt to demonstrate understanding of home exercise program.    Baseline no current program    Time 4    Period Weeks    Status New    Target Date 09/03/22               OT Long Term Goals - 08/06/22 1808       OT LONG TERM GOAL #1   Title Pt will demonstrate modifications to tasks to complete ADL and IADL tasks with modified independence.    Baseline eval:  difficulty with dressing skills, cutting  food.    Time 4    Period Weeks    Status New    Target Date 09/03/22      OT LONG TERM GOAL #2   Title Pt will complete buttons, snaps and zippers with modified independence.    Baseline Eval:  difficulty with buttons, snaps and zippers.    Time 4    Period Weeks    Status New    Target Date 09/03/22      OT LONG TERM GOAL #3   Title Pt will demonstrate understanding of positioning of UEs to avoid further compression of ulnar nerve    Baseline Eval:  limited knowledge    Time 4    Period Weeks    Status New    Target Date 09/03/22      OT LONG TERM GOAL #4   Title Pt will demonstrate improved grip on left by 10# to decrease dropping of items    Baseline Eval:  dropping items frequently.    Time 4    Period Weeks    Status New    Target Date 09/03/22                    Patient  will benefit from skilled therapeutic intervention in order to improve the following deficits and impairments:           Visit Diagnosis: Muscle weakness (generalized)  Other disturbances of skin sensation    Problem List Patient Active Problem List   Diagnosis Date Noted   Diabetic neuropathy associated with type 2 diabetes mellitus 11/30/2020   Numbness and tingling in left hand 11/22/2020   Hearing loss of right ear due to cerumen impaction 08/10/2020   Hearing loss of left ear 08/10/2020   Balance problem 08/10/2020   Type 2 diabetes mellitus 07/10/2020   Hyperlipidemia 07/10/2020   Bell's palsy 07/07/2020   Elevated blood-pressure reading without diagnosis of hypertension 07/07/2020    Rosalyn Gess, OTR/L,CLT 08/14/2022, 10:52 AM  [facility] [address] [place], [address] Phone: [phone]   Fax:  [phone]  Name: Matthew Jennings MRN: [id number] Date of Birth: January 03, 1966

## 2022-08-18 ENCOUNTER — Ambulatory Visit (INDEPENDENT_AMBULATORY_CARE_PROVIDER_SITE_OTHER): Payer: Medicaid Other | Admitting: Family

## 2022-08-18 ENCOUNTER — Ambulatory Visit: Payer: Medicaid Other | Admitting: Physical Therapy

## 2022-08-18 ENCOUNTER — Encounter: Payer: Self-pay | Admitting: Family

## 2022-08-18 ENCOUNTER — Encounter: Payer: Self-pay | Admitting: Physical Therapy

## 2022-08-18 VITALS — BP 133/77 | HR 76 | Temp 98.1°F | Resp 14 | Ht 70.0 in | Wt 163.0 lb

## 2022-08-18 DIAGNOSIS — M6281 Muscle weakness (generalized): Secondary | ICD-10-CM | POA: Diagnosis present

## 2022-08-18 DIAGNOSIS — M542 Cervicalgia: Secondary | ICD-10-CM | POA: Diagnosis present

## 2022-08-18 DIAGNOSIS — M79602 Pain in left arm: Secondary | ICD-10-CM | POA: Diagnosis present

## 2022-08-18 DIAGNOSIS — R278 Other lack of coordination: Secondary | ICD-10-CM | POA: Diagnosis present

## 2022-08-18 DIAGNOSIS — E119 Type 2 diabetes mellitus without complications: Secondary | ICD-10-CM | POA: Diagnosis not present

## 2022-08-18 DIAGNOSIS — R208 Other disturbances of skin sensation: Secondary | ICD-10-CM | POA: Diagnosis present

## 2022-08-18 DIAGNOSIS — E785 Hyperlipidemia, unspecified: Secondary | ICD-10-CM | POA: Diagnosis not present

## 2022-08-18 LAB — POCT GLYCOSYLATED HEMOGLOBIN (HGB A1C): HbA1c, POC (controlled diabetic range): 10.5 % — AB (ref 0.0–7.0)

## 2022-08-18 MED ORDER — EMPAGLIFLOZIN 25 MG PO TABS: 25.0000 mg | ORAL_TABLET | Freq: Every day | ORAL | 0 refills | Status: DC

## 2022-08-18 MED ORDER — SEMAGLUTIDE (2 MG/DOSE) 8 MG/3ML ~~LOC~~ SOPN: 2.0000 mg | PEN_INJECTOR | SUBCUTANEOUS | 0 refills | Status: DC

## 2022-08-18 MED ORDER — TRUE METRIX METER W/DEVICE KIT: PACK | 0 refills | Status: DC

## 2022-08-18 MED ORDER — INSULIN PEN NEEDLE 31G X 8 MM MISC: 0 refills | Status: DC

## 2022-08-18 MED ORDER — TRUE METRIX BLOOD GLUCOSE TEST VI STRP: ORAL_STRIP | 12 refills | Status: DC

## 2022-08-18 MED ORDER — METFORMIN HCL 1000 MG PO TABS: 1000.0000 mg | ORAL_TABLET | Freq: Two times a day (BID) | ORAL | 0 refills | Status: DC

## 2022-08-18 MED ORDER — ATORVASTATIN CALCIUM 40 MG PO TABS: 40.0000 mg | ORAL_TABLET | Freq: Every day | ORAL | 0 refills | Status: DC

## 2022-08-18 MED ORDER — TRUEPLUS LANCETS 28G MISC: 4 refills | Status: DC

## 2022-08-18 MED ORDER — SITAGLIPTIN PHOSPHATE 25 MG PO TABS: 25.0000 mg | ORAL_TABLET | Freq: Every day | ORAL | 0 refills | Status: DC

## 2022-08-18 NOTE — Therapy (Signed)
OUTPATIENT PHYSICAL THERAPY CERVICAL EVALUATION   Patient Name: Matthew BrucknerBryant D Jennings MRN: [id number] DOB:10-17-1965, 57 y.o., male Today's Date: 08/18/2022  END OF SESSION:  PT End of Session - 08/18/22 1117     Visit Number 1 (P)     Number of Visits 17 (P)     Date for PT Re-Evaluation 10/17/22 (P)     Authorization - Visit Number 1 (P)     Authorization - Number of Visits 10 (P)     Progress Note Due on Visit 10 (P)     PT Start Time 1126 (P)     PT Stop Time 1205 (P)     PT Time Calculation (min) 39 min (P)     Activity Tolerance Patient tolerated treatment well (P)     Behavior During Therapy WFL for tasks assessed/performed (P)              Past Medical History:  Diagnosis Date   Bell's palsy    left   DM (diabetes mellitus)    Facial paralysis/Bells palsy    Hx of migraines    Past Surgical History:  Procedure Laterality Date   KNEE RECONSTRUCTION Left    Patient Active Problem List   Diagnosis Date Noted   Diabetic neuropathy associated with type 2 diabetes mellitus 11/30/2020   Numbness and tingling in left hand 11/22/2020   Hearing loss of right ear due to cerumen impaction 08/10/2020   Hearing loss of left ear 08/10/2020   Balance problem 08/10/2020   Type 2 diabetes mellitus 07/10/2020   Hyperlipidemia 07/10/2020   Bell's palsy 07/07/2020   Elevated blood-pressure reading without diagnosis of hypertension 07/07/2020    PCP: Ricky StabsAmy Stephens NP   REFERRING PROVIDER: Hoyt KochJonathan Thomas MD  REFERRING DIAG: cervical rediculopathy  THERAPY DIAG:  Cervicalgia - Plan: PT plan of care cert/re-cert  Rationale for Evaluation and Treatment: Rehabilitation  ONSET DATE: Jan 2020  SUBJECTIVE:                                                                                                                                                                                                         SUBJECTIVE STATEMENT:   Hand dominance: Right  PERTINENT HISTORY:  Pt  reports to PT with bilat cervical pain. 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 Reports he has numbness and tingling "all over his body". Cannot identify numbness/tingling pattern. Pain in neck feels sharp and catching when he is sitting >1710mins,  standing >3810mins, sleeping, raising  his LUE, turning his head side to side, and driving. Patient reports pain wakes him up from sleeping and he finds it difficult to find a comfortable position to sleep in. Current/best pain 4/10; worst 9/10. Pt has migraines with aura 2x/week.  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 Pt is DM2, reports it is usually under control. Pt denies N/V, B&B changes, unexplained weight fluctuation, saddle paresthesia, fever, night sweats, or unrelenting night pain at this time.   PAIN:  Are you having pain? Yes: NPRS scale: 4/10 Pain location: all over my neck  Pain description: sharp, catching Aggravating factors: sitting >79mins,  standing >53mins, sleeping, raising his LUE, turning his head side to side, and driving.  Relieving factors: naproxen, rest  PRECAUTIONS: None  WEIGHT BEARING RESTRICTIONS: No  FALLS:  Has patient fallen in last 6 months? No  LIVING ENVIRONMENT: Lives with: lives alone Lives in: House/apartment Stairs: Yes: External: 3 steps; bilateral but cannot reach both Has following equipment at home: None  OCCUPATION: On disability   PLOF: Independent with basic ADLs  PATIENT GOALS: To get better  NEXT MD VISIT: PCP next month   OBJECTIVE:   DIAGNOSTIC FINDINGS:  09/18/22 C2-3 no spinal stenosis or foraminal narrowing C3-4 no spinal stenosis or foraminal narrowing C4-5 right ward disc bulging and uncovertebral joint hypertrophy with severe right foraminal stenosis C5-6 disc bulging and uncovertebral joint hypertrophy with mild spinal stenosis and severe bilateral foraminal stenosis C6-7 disc bulging and uncovertebral joint  hypertrophy with moderate spinal stenosis and severe bilateral foraminal stenosis C7-T1 no spinal stenosis or foraminal narrowing  PATIENT SURVEYS:  FOTO needs to be set up by staff  COGNITION: Overall cognitive status: Within functional limits for tasks assessed  SENSATION: WFL  POSTURE: rounded shoulders, forward head, and increased thoracic kyphosis  PALPATION: TTP at bilat UT, levator, and periscapular (mid trap/rhomboid group) musculature; no concordant pain just that "pain is all over"   CERVICAL ROM:   Active ROM A/PROM (deg) eval  Flexion 25% limited  Extension 55  Right lateral flexion 22  Left lateral flexion 27  Right rotation 57  Left rotation 48    All passive cervical motions WNL (Blank rows = not tested)  UPPER EXTREMITY ROM:  Active ROM Right eval Left eval  Shoulder flexion 180 112  Shoulder extension wnl wnl  Shoulder abduction 180 104  Shoulder adduction    Shoulder extension    Shoulder internal rotation Apleys T10 Apleys T12  Shoulder external rotation Apleys CTJ Apleys CTJ  Elbow flexion WNL WNL  Elbow extension WNL WNL  Wrist flexion    Wrist extension    Wrist ulnar deviation    Wrist radial deviation    Wrist pronation    Wrist supination      (Blank rows = not tested)  UPPER EXTREMITY MMT:  MMT Right eval Left eval  Shoulder flexion 5 4  Shoulder extension    Shoulder abduction 5 4+  Shoulder adduction    Shoulder extension    Shoulder internal rotation 5 4+  Shoulder external rotation 5  4+  Middle trapezius 5 4+  Lower trapezius 4+ 4  Elbow flexion    Elbow extension    Wrist flexion    Wrist extension    Wrist ulnar deviation    Wrist radial deviation    Wrist pronation    Wrist supination    Grip strength 65 33  Cervical isometrics strong in all directions   (Blank rows = not tested)  CERVICAL SPECIAL TESTS:  Neck flexor muscle endurance test: Negative, Spurling's test: Negative, and Distraction test:  Negative DNF Endurance test: 10sec  ULTT R/L Median negative bilat Ulnar  positive/negative Radial negative bilat    TODAY'S TREATMENT:                                                                                                                              DATE: 08/18/22 PT reviewed the following HEP with patient with patient able to demonstrate a set of the following with min cuing for correction needed. PT educated patient on parameters of therex (how/when to inc/decrease intensity, frequency, rep/set range, stretch hold time, and purpose of therex) with verbalized understanding.  Access Code: [email or web address] - Supine Deep Neck Flexor Training - Repetitions  - 1 x daily - 7 x weekly - 3 reps - 10-30sec hold - Seated Cervical Retraction  - 8 x daily - 7 x weekly - 12 reps - 2sec hold - Seated Assisted Cervical Rotation with Towel  - 3 x daily - 7 x weekly - 12 reps   PATIENT EDUCATION:  Education details: Patient was educated on diagnosis, anatomy and pathology involved, prognosis, role of PT, and was given an HEP, demonstrating exercise with proper form following verbal and tactile cues, and was given a paper hand out to continue exercise at home. Pt was educated on and agreed to plan of care.  Person educated: Patient Education method: Explanation, Demonstration, and Handouts Education comprehension: verbalized understanding and returned demonstration  HOME EXERCISE PROGRAM: [email or web address]  ASSESSMENT:  CLINICAL IMPRESSION: Patient is a 57 y.o. male who was seen today for physical therapy evaluation and treatment for neck pain. Cervical rediculopathy seems less likely diagnosis on exam due to diffuse polyneuropathy that is mostly unable to be manipulated this session- though imaging with ULTT positive for ulnar nerve (L) makes it unable to be ruled out - EMG and MRI imaging seem to point to different explanation for this. Neck pain more likely caused by stenosis, and postural  abnormalities/poor length-tension relationships. Impairments in polyneuropathy, decreased L shoulder mobility decreased cervical mobility, decreased deep neck flexor and periscapular strength and endurance, abnormal posture, scapular dyskenesis, and pain. Activity limitations in driving, overhead reaching, lifting, carrying, dressing, bathing; inhibiting participation in ADLs. Would benefit from skilled PT to address above deficits and promote optimal return to PLOF.   OBJECTIVE IMPAIRMENTS: Abnormal gait, cardiopulmonary status limiting activity, decreased balance, decreased endurance, decreased mobility, difficulty walking, decreased ROM, decreased strength, increased fascial restrictions, impaired flexibility, impaired UE functional use, improper body mechanics, postural dysfunction, and pain.   ACTIVITY LIMITATIONS: carrying, lifting, sleeping, bathing, dressing, reach over head, and hygiene/grooming  PARTICIPATION LIMITATIONS: meal prep, cleaning, laundry, driving, shopping, community activity, and yard work  PERSONAL FACTORS: Education, Fitness, Time since onset of injury/illness/exacerbation, and 3+ comorbidities: migraines, bells palsy, DM2, hx stroke/bells palsy  are also affecting patient's functional outcome.   REHAB POTENTIAL: Good  CLINICAL DECISION MAKING:  Evolving/moderate complexity  EVALUATION COMPLEXITY: Moderate   GOALS: Goals reviewed with patient? No  SHORT TERM GOALS: Target date:  Pt will be independent with HEP in order to improve strength and balance in order to decrease fall risk and improve function at home and work. Baseline: 08/18/22 Goal status: INITIAL   LONG TERM GOALS: Target date: 10/17/22  Patient will increase FOTO score to * to demonstrate predicted increase in functional mobility to complete ADLs  Baseline: 08/18/22 next visit Goal status: INITIAL  2.  Pt will decrease worst pain as reported on NPRS by at least 3 points in order to demonstrate  clinically significant reduction in pain.  Baseline: 08/18/22 9/10 Goal status: INITIAL  3.  Pt will demonstrate at least 65d of cervical rotation in order to demonstrate mobility needed to scan environment Baseline: see eval Goal status: INITIAL    PLAN:  PT FREQUENCY: 1-2x/week  PT DURATION: 8 weeks  PLANNED INTERVENTIONS: Therapeutic exercises, Therapeutic activity, Neuromuscular re-education, Balance training, Gait training, Patient/Family education, Self Care, Joint mobilization, Joint manipulation, Dry Needling, Electrical stimulation, Spinal manipulation, Spinal mobilization, Cryotherapy, Moist heat, Traction, Ionotophoresis 4mg /ml Dexamethasone, Manual therapy, and Re-evaluation  PLAN FOR NEXT SESSION: FOTO  Hilda Lias DPT Hilda Lias, PT 08/18/2022, 1:41 PM

## 2022-08-18 NOTE — Progress Notes (Signed)
Pt is here a follow up   Requesting refills on all of his medications, states he was having issues getting his medications from the home delivery pharmacy so he has been out of his medications for a few weeks

## 2022-08-19 ENCOUNTER — Telehealth: Payer: Self-pay | Admitting: Neurology

## 2022-08-19 NOTE — Telephone Encounter (Signed)
Kendal Hymen called from [facility]. Requesting a call back at [phone].

## 2022-08-19 NOTE — Telephone Encounter (Signed)
Can you call her back see what they needs? thanks

## 2022-08-19 NOTE — Telephone Encounter (Signed)
I spoke to [place], with [facility] (which is [facility]  on [address]  Pt is getting PT working off referral with Dr. Maisie Fus 07-09-2022 Cervical.  This will end 10-09-2022 per pt.  And then will work on L arm.

## 2022-08-19 NOTE — Telephone Encounter (Signed)
I called pt and he was asking about getting all his therapy's done at the same place.  He has Dr. Maisie Fus with [facility] for his neck.  Dr. Lucia Gaskins had written for L arm (shoulder ulnar) but the [facility] in [place] (on [address]) will not take 2 MD orders at the same time for pt.  So he is currently getting PT which will end 10-09-2022 for his neck.  He will let us know how things go and get back in with [place] to address if still needs.  Wanted to let us know.

## 2022-08-19 NOTE — Telephone Encounter (Signed)
I called and LMVM for Kendal Hymen re: referral for pt.

## 2022-08-19 NOTE — Telephone Encounter (Signed)
Pt called wanting to know if he can change where he receives PT due to having to go to too many different locations. Pt would like to go in the same building as other doctors. He would like to know if he can be referred to the [facility] and Ortho on [facility]. Please advise.

## 2022-08-21 ENCOUNTER — Ambulatory Visit: Payer: Medicaid Other

## 2022-08-21 DIAGNOSIS — M542 Cervicalgia: Secondary | ICD-10-CM

## 2022-08-21 DIAGNOSIS — M6281 Muscle weakness (generalized): Secondary | ICD-10-CM

## 2022-08-21 DIAGNOSIS — M79602 Pain in left arm: Secondary | ICD-10-CM

## 2022-08-21 DIAGNOSIS — R208 Other disturbances of skin sensation: Secondary | ICD-10-CM

## 2022-08-21 DIAGNOSIS — R278 Other lack of coordination: Secondary | ICD-10-CM

## 2022-08-21 NOTE — Therapy (Addendum)
OUTPATIENT PHYSICAL THERAPY TREATMENT NOTE   Patient Name: Matthew Jennings MRN: [id number] DOB:[birth year], 57 y.o., male Today's Date: 08/21/2022  PCP: Ricky Stabs NP  REFERRING PROVIDER: Hoyt Koch MD   END OF SESSION:   PT End of Session - 08/21/22 1119     Visit Number 2    Number of Visits 17    Date for PT Re-Evaluation 10/17/22    Authorization - Number of Visits 10    Progress Note Due on Visit 10    PT Start Time 1121    PT Stop Time 1215    PT Time Calculation (min) 54 min    Activity Tolerance Patient tolerated treatment well    Behavior During Therapy WFL for tasks assessed/performed             Past Medical History:  Diagnosis Date   Bell's palsy    left   DM (diabetes mellitus)    Facial paralysis/Bells palsy    Hx of migraines    Past Surgical History:  Procedure Laterality Date   KNEE RECONSTRUCTION Left    Patient Active Problem List   Diagnosis Date Noted   Diabetic neuropathy associated with type 2 diabetes mellitus 11/30/2020   Numbness and tingling in left hand 11/22/2020   Hearing loss of right ear due to cerumen impaction 08/10/2020   Hearing loss of left ear 08/10/2020   Balance problem 08/10/2020   Type 2 diabetes mellitus 07/10/2020   Hyperlipidemia 07/10/2020   Bell's palsy 07/07/2020   Elevated blood-pressure reading without diagnosis of hypertension 07/07/2020    REFERRING DIAG: M54.12 (ICD-10-CM) - Cervical radiculopathy   THERAPY DIAG:  Cervicalgia  Muscle weakness (generalized)  Other disturbances of skin sensation  Other lack of coordination  Pain in left arm  Rationale for Evaluation and Treatment Rehabilitation  PERTINENT HISTORY:  Pt reports to PT with bilat cervical pain. 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 Reports he has numbness and tingling "all over his body". Cannot identify numbness/tingling pattern. Pain in neck feels sharp and catching  when he is sitting >33mins,  standing >67mins, sleeping, raising his LUE, turning his head side to side, and driving. Patient reports pain wakes him up from sleeping and he finds it difficult to find a comfortable position to sleep in. Current/best pain 4/10; worst 9/10. Pt has migraines with aura 2x/week.  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 Pt is DM2, reports it is usually under control. Pt denies N/V, B&B changes, unexplained weight fluctuation, saddle paresthesia, fever, night sweats, or unrelenting night pain at this time.   PRECAUTIONS: None  SUBJECTIVE:  SUBJECTIVE STATEMENT:  Pt reports that it's cold, rainy and it's causing his neck and everything else to be stiff.  Pt reports pain to be 3-4/10.  Pt notes he is doing the HEP but lifting his head off the table and holding has been causing him to have migraines.     PAIN:  Are you having pain? Yes: NPRS scale: 4/10 Pain location: Neck Pain description: Sore/Stiff Aggravating factors: Weather   OBJECTIVE: (objective measures completed at initial evaluation unless otherwise dated)  DIAGNOSTIC FINDINGS:  09/18/22 C2-3 no spinal stenosis or foraminal narrowing C3-4 no spinal stenosis or foraminal narrowing C4-5 right ward disc bulging and uncovertebral joint hypertrophy with severe right foraminal stenosis C5-6 disc bulging and uncovertebral joint hypertrophy with mild spinal stenosis and severe bilateral foraminal stenosis C6-7 disc bulging and uncovertebral joint hypertrophy with moderate spinal stenosis and severe bilateral foraminal stenosis C7-T1 no spinal stenosis or foraminal narrowing   PATIENT SURVEYS:  FOTO needs to be set up by staff   COGNITION: Overall cognitive status: Within functional limits for tasks assessed   SENSATION: WFL    POSTURE: rounded shoulders, forward head, and increased thoracic kyphosis   PALPATION: TTP at bilat UT, levator, and periscapular (mid trap/rhomboid group) musculature; no concordant pain just that "pain is all over"       CERVICAL ROM:    Active ROM A/PROM (deg) eval  Flexion 25% limited  Extension 55  Right lateral flexion 22  Left lateral flexion 27  Right rotation 57  Left rotation 48    All passive cervical motions WNL (Blank rows = not tested)   UPPER EXTREMITY ROM:   Active ROM Right eval Left eval  Shoulder flexion 180 112  Shoulder extension wnl wnl  Shoulder abduction 180 104  Shoulder adduction      Shoulder extension      Shoulder internal rotation Apleys T10 Apleys T12  Shoulder external rotation Apleys CTJ Apleys CTJ  Elbow flexion WNL WNL  Elbow extension WNL WNL  Wrist flexion      Wrist extension      Wrist ulnar deviation      Wrist radial deviation      Wrist pronation      Wrist supination         (Blank rows = not tested)   UPPER EXTREMITY MMT:   MMT Right eval Left eval  Shoulder flexion 5 4  Shoulder extension      Shoulder abduction 5 4+  Shoulder adduction      Shoulder extension      Shoulder internal rotation 5 4+  Shoulder external rotation 5  4+  Middle trapezius 5 4+  Lower trapezius 4+ 4  Elbow flexion      Elbow extension      Wrist flexion      Wrist extension      Wrist ulnar deviation      Wrist radial deviation      Wrist pronation      Wrist supination      Grip strength 65 33  Cervical isometrics strong in all directions    (Blank rows = not tested)   CERVICAL SPECIAL TESTS:  Neck flexor muscle endurance test: Negative, Spurling's test: Negative, and Distraction test: Negative DNF Endurance test: 10sec   ULTT R/L Median negative bilat Ulnar positive/negative Radial negative bilat       TODAY'S TREATMENT: DATE: 08/21/22  Manual:  Supine STM to cervical region to increase extensibility of the  paraspinals Supine suboccipital release technique to decrease cervicalgia Supine manual traction performed in order to increase joint space in cervical region for pain relief Supine cervical upglides/downglides, 30 sec bouts Supine UT/Levator stretch, 30 sec bouts to increase tissue extensibility of the cervical region Supine STM with TP release technique applied to the UT's for pain modulation and increased tissue extensibility     PATIENT EDUCATION:  Education details: Patient was educated on diagnosis, anatomy and pathology involved, prognosis, role of PT, and was given an HEP, demonstrating exercise with proper form following verbal and tactile cues, and was given a paper hand out to continue exercise at home. Pt was educated on and agreed to plan of care.   Person educated: Patient Education method: Explanation, Demonstration, and Handouts Education comprehension: verbalized understanding and returned demonstration   HOME EXERCISE PROGRAM: [id number]   ASSESSMENT:   CLINICAL IMPRESSION:  Pt performed well with the manual therapy approaches given and responded well wih a reduction in overall painful symptoms upon leaving the clinic.  Pt does have significant tightness of the cervical paraspinals and the UT's bilaterally, however is able to reduce musculature restriction with manual therapy.  Pt also has difficulty with relaxing completely and has to be verbally cued throughout the session.   Pt will continue to benefit from skilled therapy to address remaining deficits in order to improve overall QoL and return to PLOF.        OBJECTIVE IMPAIRMENTS: Abnormal gait, cardiopulmonary status limiting activity, decreased balance, decreased endurance, decreased mobility, difficulty walking, decreased ROM, decreased strength, increased fascial restrictions, impaired flexibility, impaired UE functional use, improper body mechanics, postural dysfunction, and pain.    ACTIVITY LIMITATIONS:  carrying, lifting, sleeping, bathing, dressing, reach over head, and hygiene/grooming   PARTICIPATION LIMITATIONS: meal prep, cleaning, laundry, driving, shopping, community activity, and yard work   PERSONAL FACTORS: Education, Fitness, Time since onset of injury/illness/exacerbation, and 3+ comorbidities: migraines, bells palsy, DM2, hx stroke/bells palsy  are also affecting patient's functional outcome.    REHAB POTENTIAL: Good   CLINICAL DECISION MAKING: Evolving/moderate complexity   EVALUATION COMPLEXITY: Moderate     GOALS: Goals reviewed with patient? No   SHORT TERM GOALS: Target date: 09/15/2022    Pt will be independent with HEP in order to improve strength and balance in order to decrease fall risk and improve function at home and work. Baseline: 08/18/22 Goal status: INITIAL     LONG TERM GOALS: Target date: 10/17/22   Patient will increase FOTO score to 53 to demonstrate predicted increase in functional mobility to complete ADLs Baseline: FOTO: 38 Goal status: INITIAL   2.  Pt will decrease worst pain as reported on NPRS by at least 3 points in order to demonstrate clinically significant reduction in pain. Baseline: 08/18/22 9/10 Goal status: INITIAL   3.  Pt will demonstrate at least 65d of cervical rotation in order to demonstrate mobility needed to scan environment Baseline: see eval Goal status: INITIAL    Nolon BussingJoshua Sahil Milner, PT, DPT Physical Therapist - [facility]  [facility]  08/21/22, 11:36 AM

## 2022-08-26 ENCOUNTER — Telehealth: Payer: Self-pay | Admitting: Family

## 2022-08-26 ENCOUNTER — Other Ambulatory Visit: Payer: Self-pay

## 2022-08-26 ENCOUNTER — Ambulatory Visit: Payer: Medicaid Other

## 2022-08-26 DIAGNOSIS — M542 Cervicalgia: Secondary | ICD-10-CM

## 2022-08-26 DIAGNOSIS — M6281 Muscle weakness (generalized): Secondary | ICD-10-CM | POA: Diagnosis not present

## 2022-08-26 NOTE — Telephone Encounter (Signed)
Copied from CRM [id number]. Topic: General - Other >> Aug 25, 2022  3:34 PM Carrielelia G wrote: Reason for CRM: pt was told by [facility] - [place], [place] - [address] [place] [address], Phone: [phone] Fax: [phone]   Hours: Not open 24 hour  That his RX   empagliflozin (JARDIANCE) 25 MG TABS tablet and  Semaglutide, 2 MG/DOSE, 8 MG/3ML SOPN ........Marland Kitchenrequires and authorization

## 2022-08-26 NOTE — Therapy (Signed)
OUTPATIENT PHYSICAL THERAPY TREATMENT NOTE   Patient Name: Matthew Jennings MRN: [id number] DOB:March 01, 1966, 57 y.o., male Today's Date: 08/26/2022  PCP: Ricky Stabs NP  REFERRING PROVIDER: Hoyt Koch MD   END OF SESSION:   PT End of Session - 08/26/22 0926     Visit Number 3    Number of Visits 17    Date for PT Re-Evaluation 10/17/22    Authorization - Visit Number 3    Authorization - Number of Visits 10    Progress Note Due on Visit 10    PT Start Time 0924    PT Stop Time 1004    PT Time Calculation (min) 40 min    Activity Tolerance Patient tolerated treatment well    Behavior During Therapy WFL for tasks assessed/performed              Past Medical History:  Diagnosis Date   Bell's palsy    left   DM (diabetes mellitus)    Facial paralysis/Bells palsy    Hx of migraines    Past Surgical History:  Procedure Laterality Date   KNEE RECONSTRUCTION Left    Patient Active Problem List   Diagnosis Date Noted   Diabetic neuropathy associated with type 2 diabetes mellitus 11/30/2020   Numbness and tingling in left hand 11/22/2020   Hearing loss of right ear due to cerumen impaction 08/10/2020   Hearing loss of left ear 08/10/2020   Balance problem 08/10/2020   Type 2 diabetes mellitus 07/10/2020   Hyperlipidemia 07/10/2020   Bell's palsy 07/07/2020   Elevated blood-pressure reading without diagnosis of hypertension 07/07/2020    REFERRING DIAG: M54.12 (ICD-10-CM) - Cervical radiculopathy   THERAPY DIAG:  Cervicalgia  Rationale for Evaluation and Treatment Rehabilitation  PERTINENT HISTORY:  Pt reports to PT with bilat cervical pain. 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 Reports he has numbness and tingling "all over his body". Cannot identify numbness/tingling pattern. Pain in neck feels sharp and catching when he is sitting >81mins,  standing >29mins, sleeping, raising his LUE, turning his  head side to side, and driving. Patient reports pain wakes him up from sleeping and he finds it difficult to find a comfortable position to sleep in. Current/best pain 4/10; worst 9/10. Pt has migraines with aura 2x/week.  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 Pt is DM2, reports it is usually under control. Pt denies N/V, B&B changes, unexplained weight fluctuation, saddle paresthesia, fever, night sweats, or unrelenting night pain at this time.   PRECAUTIONS: None  SUBJECTIVE:  SUBJECTIVE STATEMENT:    Pt reports he felt good after last visit, but once he got home and relaxed he felt numbness/tingling in the UE.  Pt is hoping that it eventually resolves.   PAIN:  Are you having pain? Yes: NPRS scale: 4/10 Pain location: Neck Pain description: Sore/Stiff Aggravating factors: Weather   OBJECTIVE: (objective measures completed at initial evaluation unless otherwise dated)  DIAGNOSTIC FINDINGS:  09/18/22 C2-3 no spinal stenosis or foraminal narrowing C3-4 no spinal stenosis or foraminal narrowing C4-5 right ward disc bulging and uncovertebral joint hypertrophy with severe right foraminal stenosis C5-6 disc bulging and uncovertebral joint hypertrophy with mild spinal stenosis and severe bilateral foraminal stenosis C6-7 disc bulging and uncovertebral joint hypertrophy with moderate spinal stenosis and severe bilateral foraminal stenosis C7-T1 no spinal stenosis or foraminal narrowing   PATIENT SURVEYS:  FOTO needs to be set up by staff   COGNITION: Overall cognitive status: Within functional limits for tasks assessed   SENSATION: WFL   POSTURE: rounded shoulders, forward head, and increased thoracic kyphosis   PALPATION: TTP at bilat UT, levator, and periscapular (mid trap/rhomboid group)  musculature; no concordant pain just that "pain is all over"       CERVICAL ROM:    Active ROM A/PROM (deg) eval  Flexion 25% limited  Extension 55  Right lateral flexion 22  Left lateral flexion 27  Right rotation 57  Left rotation 48    All passive cervical motions WNL (Blank rows = not tested)   UPPER EXTREMITY ROM:   Active ROM Right eval Left eval  Shoulder flexion 180 112  Shoulder extension wnl wnl  Shoulder abduction 180 104  Shoulder adduction      Shoulder extension      Shoulder internal rotation Apleys T10 Apleys T12  Shoulder external rotation Apleys CTJ Apleys CTJ  Elbow flexion WNL WNL  Elbow extension WNL WNL  Wrist flexion      Wrist extension      Wrist ulnar deviation      Wrist radial deviation      Wrist pronation      Wrist supination         (Blank rows = not tested)   UPPER EXTREMITY MMT:   MMT Right eval Left eval  Shoulder flexion 5 4  Shoulder extension      Shoulder abduction 5 4+  Shoulder adduction      Shoulder extension      Shoulder internal rotation 5 4+  Shoulder external rotation 5  4+  Middle trapezius 5 4+  Lower trapezius 4+ 4  Elbow flexion      Elbow extension      Wrist flexion      Wrist extension      Wrist ulnar deviation      Wrist radial deviation      Wrist pronation      Wrist supination      Grip strength 65 33  Cervical isometrics strong in all directions    (Blank rows = not tested)   CERVICAL SPECIAL TESTS:  Neck flexor muscle endurance test: Negative, Spurling's test: Negative, and Distraction test: Negative DNF Endurance test: 10sec   ULTT R/L Median negative bilat Ulnar positive/negative Radial negative bilat       TODAY'S TREATMENT: DATE: 08/26/22   Manual:  Supine STM to cervical region to increase extensibility of the paraspinals Supine suboccipital release technique to decrease cervicalgia Supine manual traction performed in order to increase joint space in  cervical region  for pain relief Supine cervical upglides/downglides, 30 sec bouts Supine UT/Levator stretch, 30 sec bouts to increase tissue extensibility of the cervical region Supine STM with TP release technique applied to the UT's bilaterally for pain modulation and increased tissue extensibility   PATIENT EDUCATION:  Education details: Patient was educated on diagnosis, anatomy and pathology involved, prognosis, role of PT, and was given an HEP, demonstrating exercise with proper form following verbal and tactile cues, and was given a paper hand out to continue exercise at home. Pt was educated on and agreed to plan of care.   Person educated: Patient Education method: Explanation, Demonstration, and Handouts Education comprehension: verbalized understanding and returned demonstration   HOME EXERCISE PROGRAM: [id number]   ASSESSMENT:   CLINICAL IMPRESSION:   Pt continues to respond well to the manual therapy, however may need to shift towards performing neuro tension activities during the next sessions in order for pt to improve the symptoms that he is experiencing at home.  Pt overall responds well to the manual movements and stretching which allows for greater ROM, however he notes the benefit to be short-lived.  Pt educated on dry needling approach, however he refuses at this time.   Pt will continue to benefit from skilled therapy to address remaining deficits in order to improve overall QoL and return to PLOF.         OBJECTIVE IMPAIRMENTS: Abnormal gait, cardiopulmonary status limiting activity, decreased balance, decreased endurance, decreased mobility, difficulty walking, decreased ROM, decreased strength, increased fascial restrictions, impaired flexibility, impaired UE functional use, improper body mechanics, postural dysfunction, and pain.    ACTIVITY LIMITATIONS: carrying, lifting, sleeping, bathing, dressing, reach over head, and hygiene/grooming   PARTICIPATION LIMITATIONS: meal prep,  cleaning, laundry, driving, shopping, community activity, and yard work   PERSONAL FACTORS: Education, Fitness, Time since onset of injury/illness/exacerbation, and 3+ comorbidities: migraines, bells palsy, DM2, hx stroke/bells palsy  are also affecting patient's functional outcome.    REHAB POTENTIAL: Good   CLINICAL DECISION MAKING: Evolving/moderate complexity   EVALUATION COMPLEXITY: Moderate     GOALS: Goals reviewed with patient? No   SHORT TERM GOALS: Target date: 09/15/2022    Pt will be independent with HEP in order to improve strength and balance in order to decrease fall risk and improve function at home and work. Baseline: 08/18/22 Goal status: INITIAL     LONG TERM GOALS: Target date: 10/17/22   Patient will increase FOTO score to 53 to demonstrate predicted increase in functional mobility to complete ADLs Baseline: FOTO: 38 Goal status: INITIAL   2.  Pt will decrease worst pain as reported on NPRS by at least 3 points in order to demonstrate clinically significant reduction in pain. Baseline: 08/18/22 9/10 Goal status: INITIAL   3.  Pt will demonstrate at least 65d of cervical rotation in order to demonstrate mobility needed to scan environment Baseline: see eval Goal status: INITIAL    Nolon Bussing, PT, DPT Physical Therapist - [facility]  08/26/22, 12:46 PM

## 2022-08-27 ENCOUNTER — Other Ambulatory Visit: Payer: Self-pay

## 2022-08-28 ENCOUNTER — Ambulatory Visit: Payer: Medicaid Other

## 2022-08-28 DIAGNOSIS — M6281 Muscle weakness (generalized): Secondary | ICD-10-CM

## 2022-08-28 DIAGNOSIS — M79602 Pain in left arm: Secondary | ICD-10-CM

## 2022-08-28 DIAGNOSIS — M542 Cervicalgia: Secondary | ICD-10-CM

## 2022-08-28 NOTE — Therapy (Signed)
OUTPATIENT PHYSICAL THERAPY TREATMENT NOTE   Patient Name: Matthew Jennings MRN: [id number] DOB:[birth year], 57 y.o., male Today's Date: 08/28/2022  PCP: Ricky Stabs NP  REFERRING PROVIDER: Hoyt Koch MD   END OF SESSION:   PT End of Session - 08/28/22 0919     Visit Number 4    Number of Visits 17    Date for PT Re-Evaluation 10/17/22    Authorization - Visit Number 4    Authorization - Number of Visits 10    Progress Note Due on Visit 10    PT Start Time 0919    PT Stop Time 1000    PT Time Calculation (min) 41 min    Activity Tolerance Patient tolerated treatment well    Behavior During Therapy WFL for tasks assessed/performed              Past Medical History:  Diagnosis Date   Bell's palsy    left   DM (diabetes mellitus)    Facial paralysis/Bells palsy    Hx of migraines    Past Surgical History:  Procedure Laterality Date   KNEE RECONSTRUCTION Left    Patient Active Problem List   Diagnosis Date Noted   Diabetic neuropathy associated with type 2 diabetes mellitus 11/30/2020   Numbness and tingling in left hand 11/22/2020   Hearing loss of right ear due to cerumen impaction 08/10/2020   Hearing loss of left ear 08/10/2020   Balance problem 08/10/2020   Type 2 diabetes mellitus 07/10/2020   Hyperlipidemia 07/10/2020   Bell's palsy 07/07/2020   Elevated blood-pressure reading without diagnosis of hypertension 07/07/2020    REFERRING DIAG: M54.12 (ICD-10-CM) - Cervical radiculopathy   THERAPY DIAG:  Cervicalgia  Muscle weakness (generalized)  Pain in left arm  Rationale for Evaluation and Treatment Rehabilitation  PERTINENT HISTORY:  Pt reports to PT with bilat cervical pain. 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 Reports he has numbness and tingling "all over his body". Cannot identify numbness/tingling pattern. Pain in neck feels sharp and catching when he is sitting >82mins,   standing >19mins, sleeping, raising his LUE, turning his head side to side, and driving. Patient reports pain wakes him up from sleeping and he finds it difficult to find a comfortable position to sleep in. Current/best pain 4/10; worst 9/10. Pt has migraines with aura 2x/week.  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 Pt is DM2, reports it is usually under control. Pt denies N/V, B&B changes, unexplained weight fluctuation, saddle paresthesia, fever, night sweats, or unrelenting night pain at this time.   PRECAUTIONS: None  SUBJECTIVE:  SUBJECTIVE STATEMENT:    Pt denies anything new since the last visit.  Pt states he is still stiff.   PAIN:  Are you having pain? Yes: NPRS scale: 4/10 Pain location: Neck Pain description: Sore/Stiff Aggravating factors: Weather   OBJECTIVE: (objective measures completed at initial evaluation unless otherwise dated)  DIAGNOSTIC FINDINGS:  09/18/22 C2-3 no spinal stenosis or foraminal narrowing C3-4 no spinal stenosis or foraminal narrowing C4-5 right ward disc bulging and uncovertebral joint hypertrophy with severe right foraminal stenosis C5-6 disc bulging and uncovertebral joint hypertrophy with mild spinal stenosis and severe bilateral foraminal stenosis C6-7 disc bulging and uncovertebral joint hypertrophy with moderate spinal stenosis and severe bilateral foraminal stenosis C7-T1 no spinal stenosis or foraminal narrowing   PATIENT SURVEYS:  FOTO needs to be set up by staff   COGNITION: Overall cognitive status: Within functional limits for tasks assessed   SENSATION: WFL   POSTURE: rounded shoulders, forward head, and increased thoracic kyphosis   PALPATION: TTP at bilat UT, levator, and periscapular (mid trap/rhomboid group) musculature; no concordant pain  just that "pain is all over"       CERVICAL ROM:    Active ROM A/PROM (deg) eval  Flexion 25% limited  Extension 55  Right lateral flexion 22  Left lateral flexion 27  Right rotation 57  Left rotation 48    All passive cervical motions WNL (Blank rows = not tested)   UPPER EXTREMITY ROM:   Active ROM Right eval Left eval  Shoulder flexion 180 112  Shoulder extension wnl wnl  Shoulder abduction 180 104  Shoulder adduction      Shoulder extension      Shoulder internal rotation Apleys T10 Apleys T12  Shoulder external rotation Apleys CTJ Apleys CTJ  Elbow flexion WNL WNL  Elbow extension WNL WNL  Wrist flexion      Wrist extension      Wrist ulnar deviation      Wrist radial deviation      Wrist pronation      Wrist supination         (Blank rows = not tested)   UPPER EXTREMITY MMT:   MMT Right eval Left eval  Shoulder flexion 5 4  Shoulder extension      Shoulder abduction 5 4+  Shoulder adduction      Shoulder extension      Shoulder internal rotation 5 4+  Shoulder external rotation 5  4+  Middle trapezius 5 4+  Lower trapezius 4+ 4  Elbow flexion      Elbow extension      Wrist flexion      Wrist extension      Wrist ulnar deviation      Wrist radial deviation      Wrist pronation      Wrist supination      Grip strength 65 33  Cervical isometrics strong in all directions    (Blank rows = not tested)   CERVICAL SPECIAL TESTS:  Neck flexor muscle endurance test: Negative, Spurling's test: Negative, and Distraction test: Negative DNF Endurance test: 10sec   ULTT R/L Median negative bilat Ulnar positive/negative Radial negative bilat       TODAY'S TREATMENT: DATE: 08/28/22  Manual:  Supine STM to cervical region to increase extensibility of the paraspinals Supine suboccipital release technique to decrease cervicalgia Supine manual traction performed in order to increase joint space in cervical region for pain relief Supine cervical  upglides/downglides, 30 sec bouts Supine UT/Levator stretch,  30 sec bouts to increase tissue extensibility of the cervical region Supine STM with TP release technique applied to the UT's bilaterally for pain modulation and increased tissue extensibility   TherEx:  Seated resisted ER of B UE with GTB, elbows staying by side, 2x10 Seated scapular rows at OMEGA, 25#, 2x10 Standing lat pull downs at OMEGA, 15#, 2x10 Standing physioball rolls up the wall, red physioball, 2x10 Standing median and ulnar nerve tensioning exercises against the wall, x10 each   PATIENT EDUCATION:  Education details: Patient was educated on diagnosis, anatomy and pathology involved, prognosis, role of PT, and was given an HEP, demonstrating exercise with proper form following verbal and tactile cues, and was given a paper hand out to continue exercise at home. Pt was educated on and agreed to plan of care.   Person educated: Patient Education method: Explanation, Demonstration, and Handouts Education comprehension: verbalized understanding and returned demonstration   HOME EXERCISE PROGRAM: [id number]   ASSESSMENT:   CLINICAL IMPRESSION:  Pt able to progress to performing exercises in order to strengthen the shoulders and improve neck mobility.  Pt does have limited ROM of the L shoulder that causes the pt to have to modify positioning in order to perform exercises, but will continue to improve as therapy progresses.  Pt encouraged to perform the nerve glides/tensioning exercises.   Pt will continue to benefit from skilled therapy to address remaining deficits in order to improve overall QoL and return to PLOF.          OBJECTIVE IMPAIRMENTS: Abnormal gait, cardiopulmonary status limiting activity, decreased balance, decreased endurance, decreased mobility, difficulty walking, decreased ROM, decreased strength, increased fascial restrictions, impaired flexibility, impaired UE functional use, improper body  mechanics, postural dysfunction, and pain.    ACTIVITY LIMITATIONS: carrying, lifting, sleeping, bathing, dressing, reach over head, and hygiene/grooming   PARTICIPATION LIMITATIONS: meal prep, cleaning, laundry, driving, shopping, community activity, and yard work   PERSONAL FACTORS: Education, Fitness, Time since onset of injury/illness/exacerbation, and 3+ comorbidities: migraines, bells palsy, DM2, hx stroke/bells palsy  are also affecting patient's functional outcome.    REHAB POTENTIAL: Good   CLINICAL DECISION MAKING: Evolving/moderate complexity   EVALUATION COMPLEXITY: Moderate     GOALS: Goals reviewed with patient? No   SHORT TERM GOALS: Target date: 09/15/2022    Pt will be independent with HEP in order to improve strength and balance in order to decrease fall risk and improve function at home and work. Baseline: 08/18/22 Goal status: INITIAL     LONG TERM GOALS: Target date: 10/17/22   Patient will increase FOTO score to 53 to demonstrate predicted increase in functional mobility to complete ADLs Baseline: FOTO: 38 Goal status: INITIAL   2.  Pt will decrease worst pain as reported on NPRS by at least 3 points in order to demonstrate clinically significant reduction in pain. Baseline: 08/18/22 9/10 Goal status: INITIAL   3.  Pt will demonstrate at least 65d of cervical rotation in order to demonstrate mobility needed to scan environment Baseline: see eval Goal status: INITIAL    Nolon Bussing, PT, DPT Physical Therapist - [facility]  08/28/22, 10:04 AM

## 2022-09-01 ENCOUNTER — Ambulatory Visit: Payer: Medicaid Other

## 2022-09-01 DIAGNOSIS — M6281 Muscle weakness (generalized): Secondary | ICD-10-CM

## 2022-09-01 DIAGNOSIS — M542 Cervicalgia: Secondary | ICD-10-CM

## 2022-09-01 NOTE — Therapy (Signed)
OUTPATIENT PHYSICAL THERAPY TREATMENT NOTE   Patient Name: Matthew Jennings MRN: [id number] DOB:20-Jun-1965, 57 y.o., male Today's Date: 09/01/2022  PCP: Ricky Stabs NP  REFERRING PROVIDER: Hoyt Koch MD   END OF SESSION:   PT End of Session - 09/01/22 0831     Visit Number 5    Number of Visits 17    Date for PT Re-Evaluation 10/17/22    Authorization - Visit Number 5    Authorization - Number of Visits 10    Progress Note Due on Visit 10    PT Start Time 0831    PT Stop Time 0915    PT Time Calculation (min) 44 min    Activity Tolerance Patient tolerated treatment well    Behavior During Therapy WFL for tasks assessed/performed              Past Medical History:  Diagnosis Date   Bell's palsy    left   DM (diabetes mellitus)    Facial paralysis/Bells palsy    Hx of migraines    Past Surgical History:  Procedure Laterality Date   KNEE RECONSTRUCTION Left    Patient Active Problem List   Diagnosis Date Noted   Diabetic neuropathy associated with type 2 diabetes mellitus 11/30/2020   Numbness and tingling in left hand 11/22/2020   Hearing loss of right ear due to cerumen impaction 08/10/2020   Hearing loss of left ear 08/10/2020   Balance problem 08/10/2020   Type 2 diabetes mellitus 07/10/2020   Hyperlipidemia 07/10/2020   Bell's palsy 07/07/2020   Elevated blood-pressure reading without diagnosis of hypertension 07/07/2020    REFERRING DIAG: M54.12 (ICD-10-CM) - Cervical radiculopathy   THERAPY DIAG:  Cervicalgia  Muscle weakness (generalized)  Rationale for Evaluation and Treatment Rehabilitation  PERTINENT HISTORY:  Pt reports to PT with bilat cervical pain. 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 Reports he has numbness and tingling "all over his body". Cannot identify numbness/tingling pattern. Pain in neck feels sharp and catching when he is sitting >80mins,  standing >29mins,  sleeping, raising his LUE, turning his head side to side, and driving. Patient reports pain wakes him up from sleeping and he finds it difficult to find a comfortable position to sleep in. Current/best pain 4/10; worst 9/10. Pt has migraines with aura 2x/week.  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 Pt is DM2, reports it is usually under control. Pt denies N/V, B&B changes, unexplained weight fluctuation, saddle paresthesia, fever, night sweats, or unrelenting night pain at this time.   PRECAUTIONS: None  SUBJECTIVE:  SUBJECTIVE STATEMENT:  Pt reports no pain upon arrival, but had significant migraine over the weekend.    PAIN:  Are you having pain? Yes: NPRS scale: 0/10 Pain location: Neck Pain description: Sore/Stiff Aggravating factors: Weather   OBJECTIVE: (objective measures completed at initial evaluation unless otherwise dated)  DIAGNOSTIC FINDINGS:  09/18/22 C2-3 no spinal stenosis or foraminal narrowing C3-4 no spinal stenosis or foraminal narrowing C4-5 right ward disc bulging and uncovertebral joint hypertrophy with severe right foraminal stenosis C5-6 disc bulging and uncovertebral joint hypertrophy with mild spinal stenosis and severe bilateral foraminal stenosis C6-7 disc bulging and uncovertebral joint hypertrophy with moderate spinal stenosis and severe bilateral foraminal stenosis C7-T1 no spinal stenosis or foraminal narrowing   PATIENT SURVEYS:  FOTO needs to be set up by staff   COGNITION: Overall cognitive status: Within functional limits for tasks assessed   SENSATION: WFL   POSTURE: rounded shoulders, forward head, and increased thoracic kyphosis   PALPATION: TTP at bilat UT, levator, and periscapular (mid trap/rhomboid group) musculature; no concordant pain just that  "pain is all over"       CERVICAL ROM:    Active ROM A/PROM (deg) eval  Flexion 25% limited  Extension 55  Right lateral flexion 22  Left lateral flexion 27  Right rotation 57  Left rotation 48    All passive cervical motions WNL (Blank rows = not tested)   UPPER EXTREMITY ROM:   Active ROM Right eval Left eval  Shoulder flexion 180 112  Shoulder extension wnl wnl  Shoulder abduction 180 104  Shoulder adduction      Shoulder extension      Shoulder internal rotation Apleys T10 Apleys T12  Shoulder external rotation Apleys CTJ Apleys CTJ  Elbow flexion WNL WNL  Elbow extension WNL WNL  Wrist flexion      Wrist extension      Wrist ulnar deviation      Wrist radial deviation      Wrist pronation      Wrist supination         (Blank rows = not tested)   UPPER EXTREMITY MMT:   MMT Right eval Left eval  Shoulder flexion 5 4  Shoulder extension      Shoulder abduction 5 4+  Shoulder adduction      Shoulder extension      Shoulder internal rotation 5 4+  Shoulder external rotation 5  4+  Middle trapezius 5 4+  Lower trapezius 4+ 4  Elbow flexion      Elbow extension      Wrist flexion      Wrist extension      Wrist ulnar deviation      Wrist radial deviation      Wrist pronation      Wrist supination      Grip strength 65 33  Cervical isometrics strong in all directions    (Blank rows = not tested)   CERVICAL SPECIAL TESTS:  Neck flexor muscle endurance test: Negative, Spurling's test: Negative, and Distraction test: Negative DNF Endurance test: 10sec   ULTT R/L Median negative bilat Ulnar positive/negative Radial negative bilat       TODAY'S TREATMENT: DATE: 09/01/22    Manual:  Supine STM to cervical region to increase extensibility of the paraspinals Supine suboccipital release technique to decrease cervicalgia Supine manual traction performed in order to increase joint space in cervical region for pain relief Supine cervical  upglides/downglides, 30 sec bouts Supine UT/Levator stretch, 30  sec bouts to increase tissue extensibility of the cervical region Supine STM with TP release technique applied to the UT's bilaterally for pain modulation and increased tissue extensibility Supine median/ulnar nerve glides with manual therapy, 2x10 each   TherEx:  Seated resisted ER of B UE with GTB, elbows staying by side, 2x10 Seated scapular rows at OMEGA, 20#, 2x10 Standing lat pull downs at OMEGA, 20#, 2x10 Standing serratus rolls into forward flexion with blue foam roller, utilizing verbal and visual cues for proper form, 2x10 Standing wall circles with ball in forward flexion/abduction, CW/CCW, 30 sec bouts    PATIENT EDUCATION:  Education details: Patient was educated on diagnosis, anatomy and pathology involved, prognosis, role of PT, and was given an HEP, demonstrating exercise with proper form following verbal and tactile cues, and was given a paper hand out to continue exercise at home. Pt was educated on and agreed to plan of care.   Person educated: Patient Education method: Explanation, Demonstration, and Handouts Education comprehension: verbalized understanding and returned demonstration   HOME EXERCISE PROGRAM: [id number]   ASSESSMENT:   CLINICAL IMPRESSION:  Pt responded well to the newly introduced exercises and is making good progress with his strength and ability to mobilize the shoulders into full ROM.  Pt still limited with AROM, but has improved PROM of the cervical rotation.  Therapist will continue to monitor and address the deficits and attempt to update HEP at next appointment.   Pt will continue to benefit from skilled therapy to address remaining deficits in order to improve overall QoL and return to PLOF.            OBJECTIVE IMPAIRMENTS: Abnormal gait, cardiopulmonary status limiting activity, decreased balance, decreased endurance, decreased mobility, difficulty walking, decreased  ROM, decreased strength, increased fascial restrictions, impaired flexibility, impaired UE functional use, improper body mechanics, postural dysfunction, and pain.    ACTIVITY LIMITATIONS: carrying, lifting, sleeping, bathing, dressing, reach over head, and hygiene/grooming   PARTICIPATION LIMITATIONS: meal prep, cleaning, laundry, driving, shopping, community activity, and yard work   PERSONAL FACTORS: Education, Fitness, Time since onset of injury/illness/exacerbation, and 3+ comorbidities: migraines, bells palsy, DM2, hx stroke/bells palsy  are also affecting patient's functional outcome.    REHAB POTENTIAL: Good   CLINICAL DECISION MAKING: Evolving/moderate complexity   EVALUATION COMPLEXITY: Moderate     GOALS: Goals reviewed with patient? No   SHORT TERM GOALS: Target date: 09/15/2022    Pt will be independent with HEP in order to improve strength and balance in order to decrease fall risk and improve function at home and work. Baseline: 08/18/22 Goal status: INITIAL     LONG TERM GOALS: Target date: 10/17/22   Patient will increase FOTO score to 53 to demonstrate predicted increase in functional mobility to complete ADLs Baseline: FOTO: 38 Goal status: INITIAL   2.  Pt will decrease worst pain as reported on NPRS by at least 3 points in order to demonstrate clinically significant reduction in pain. Baseline: 08/18/22 9/10 Goal status: INITIAL   3.  Pt will demonstrate at least 65d of cervical rotation in order to demonstrate mobility needed to scan environment Baseline: see eval Goal status: INITIAL    Next visit: Update HEP to include more ROM exercises.    Nolon Bussing, PT, DPT Physical Therapist - [facility]  09/01/22, 8:33 AM

## 2022-09-03 ENCOUNTER — Ambulatory Visit: Payer: Medicaid Other

## 2022-09-03 DIAGNOSIS — M542 Cervicalgia: Secondary | ICD-10-CM

## 2022-09-03 DIAGNOSIS — M6281 Muscle weakness (generalized): Secondary | ICD-10-CM | POA: Diagnosis not present

## 2022-09-03 NOTE — Therapy (Signed)
OUTPATIENT PHYSICAL THERAPY TREATMENT NOTE   Patient Name: Matthew Jennings MRN: [id number] DOB:06-Apr-1966, 57 y.o., male Today's Date: 09/03/2022  PCP: Ricky Stabs NP  REFERRING PROVIDER: Hoyt Koch MD   END OF SESSION:   PT End of Session - 09/03/22 0832     Visit Number 6    Number of Visits 17    Date for PT Re-Evaluation 10/17/22    Authorization - Visit Number 6    Authorization - Number of Visits 10    Progress Note Due on Visit 10    PT Start Time 0832    PT Stop Time 0914    PT Time Calculation (min) 42 min    Activity Tolerance Patient tolerated treatment well    Behavior During Therapy [facility] for tasks assessed/performed               Past Medical History:  Diagnosis Date   Bell's palsy    left   DM (diabetes mellitus)    Facial paralysis/Bells palsy    Hx of migraines    Past Surgical History:  Procedure Laterality Date   KNEE RECONSTRUCTION Left    Patient Active Problem List   Diagnosis Date Noted   Diabetic neuropathy associated with type 2 diabetes mellitus 11/30/2020   Numbness and tingling in left hand 11/22/2020   Hearing loss of right ear due to cerumen impaction 08/10/2020   Hearing loss of left ear 08/10/2020   Balance problem 08/10/2020   Type 2 diabetes mellitus 07/10/2020   Hyperlipidemia 07/10/2020   Bell's palsy 07/07/2020   Elevated blood-pressure reading without diagnosis of hypertension 07/07/2020    REFERRING DIAG: M54.12 (ICD-10-CM) - Cervical radiculopathy   THERAPY DIAG:  Cervicalgia  Rationale for Evaluation and Treatment Rehabilitation  PERTINENT HISTORY:  Pt reports to PT with bilat cervical pain. 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 Reports he has numbness and tingling "all over his body". Cannot identify numbness/tingling pattern. Pain in neck feels sharp and catching when he is sitting >75mins,  standing >53mins, sleeping, raising his LUE, turning  his head side to side, and driving. Patient reports pain wakes him up from sleeping and he finds it difficult to find a comfortable position to sleep in. Current/best pain 4/10; worst 9/10. Pt has migraines with aura 2x/week.  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 Pt is DM2, reports it is usually under control. Pt denies N/V, B&B changes, unexplained weight fluctuation, saddle paresthesia, fever, night sweats, or unrelenting night pain at this time.   PRECAUTIONS: None  SUBJECTIVE:  SUBJECTIVE STATEMENT:  Pt reports he is not having any pain at this point.  Pt notes he had a good nights rest and is doing well.   PAIN:  Are you having pain? Yes: NPRS scale: 0/10 Pain location: Neck Pain description: Sore/Stiff Aggravating factors: Weather   OBJECTIVE: (objective measures completed at initial evaluation unless otherwise dated)  DIAGNOSTIC FINDINGS:  09/18/22 C2-3 no spinal stenosis or foraminal narrowing C3-4 no spinal stenosis or foraminal narrowing C4-5 right ward disc bulging and uncovertebral joint hypertrophy with severe right foraminal stenosis C5-6 disc bulging and uncovertebral joint hypertrophy with mild spinal stenosis and severe bilateral foraminal stenosis C6-7 disc bulging and uncovertebral joint hypertrophy with moderate spinal stenosis and severe bilateral foraminal stenosis C7-T1 no spinal stenosis or foraminal narrowing   PATIENT SURVEYS:  FOTO needs to be set up by staff   COGNITION: Overall cognitive status: Within functional limits for tasks assessed   SENSATION: WFL   POSTURE: rounded shoulders, forward head, and increased thoracic kyphosis   PALPATION: TTP at bilat UT, levator, and periscapular (mid trap/rhomboid group) musculature; no concordant pain just that "pain is all  over"       CERVICAL ROM:    Active ROM A/PROM (deg) eval  Flexion 25% limited  Extension 55  Right lateral flexion 22  Left lateral flexion 27  Right rotation 57  Left rotation 48    All passive cervical motions WNL (Blank rows = not tested)   UPPER EXTREMITY ROM:   Active ROM Right eval Left eval  Shoulder flexion 180 112  Shoulder extension wnl wnl  Shoulder abduction 180 104  Shoulder adduction      Shoulder extension      Shoulder internal rotation Apleys T10 Apleys T12  Shoulder external rotation Apleys CTJ Apleys CTJ  Elbow flexion WNL WNL  Elbow extension WNL WNL  Wrist flexion      Wrist extension      Wrist ulnar deviation      Wrist radial deviation      Wrist pronation      Wrist supination         (Blank rows = not tested)   UPPER EXTREMITY MMT:   MMT Right eval Left eval  Shoulder flexion 5 4  Shoulder extension      Shoulder abduction 5 4+  Shoulder adduction      Shoulder extension      Shoulder internal rotation 5 4+  Shoulder external rotation 5  4+  Middle trapezius 5 4+  Lower trapezius 4+ 4  Elbow flexion      Elbow extension      Wrist flexion      Wrist extension      Wrist ulnar deviation      Wrist radial deviation      Wrist pronation      Wrist supination      Grip strength 65 33  Cervical isometrics strong in all directions    (Blank rows = not tested)   CERVICAL SPECIAL TESTS:  Neck flexor muscle endurance test: Negative, Spurling's test: Negative, and Distraction test: Negative DNF Endurance test: 10sec   ULTT R/L Median negative bilat Ulnar positive/negative Radial negative bilat       TODAY'S TREATMENT: DATE: 09/03/22   Manual:  Supine STM to cervical region to increase extensibility of the paraspinals Supine suboccipital release technique to decrease cervicalgia Supine manual traction performed in order to increase joint space in cervical region for pain relief Supine cervical  upglides/downglides,  30 sec bouts Supine UT/Levator stretch, 30 sec bouts to increase tissue extensibility of the cervical region Supine STM with TP release technique applied to the UT's bilaterally for pain modulation and increased tissue extensibility Supine median/ulnar nerve glides with manual therapy, 2x10 each   TherEx:  Seated scapular rows at OMEGA, 25#, 2x10 Seated lat pull downs at OMEGA, 25#, 2x10 Seated shoulder rolls posteriorly, 2x10 Standing wall push-ups, 2x10 Standing wall circles with ball in forward flexion to ~110 degrees, CW/CCW, 30 sec bouts    PATIENT EDUCATION: Education details: Patient was educated on diagnosis, anatomy and pathology involved, prognosis, role of PT, and was given an HEP, demonstrating exercise with proper form following verbal and tactile cues, and was given a paper hand out to continue exercise at home. Pt was educated on and agreed to plan of care.   Person educated: Patient Education method: Explanation, Demonstration, and Handouts Education comprehension: verbalized understanding and returned demonstration   HOME EXERCISE PROGRAM: [id number]   ASSESSMENT:   CLINICAL IMPRESSION:  Pt performed well today and is doing well with all the exercises.  Pt still reports that he is having difficulty with the L arm going numb and is much weaker on that side.  Pt states he would like to also address balance, however pt was educated on referral process and noted that the current referral was placed for cervical radiculopathy and not balance at this time.  Pt understood and stated he would attempt to contact MD about new referral if possible.   Pt will continue to benefit from skilled therapy to address remaining deficits in order to improve overall QoL and return to PLOF.            OBJECTIVE IMPAIRMENTS: Abnormal gait, cardiopulmonary status limiting activity, decreased balance, decreased endurance, decreased mobility, difficulty walking, decreased ROM, decreased  strength, increased fascial restrictions, impaired flexibility, impaired UE functional use, improper body mechanics, postural dysfunction, and pain.    ACTIVITY LIMITATIONS: carrying, lifting, sleeping, bathing, dressing, reach over head, and hygiene/grooming   PARTICIPATION LIMITATIONS: meal prep, cleaning, laundry, driving, shopping, community activity, and yard work   PERSONAL FACTORS: Education, Fitness, Time since onset of injury/illness/exacerbation, and 3+ comorbidities: migraines, bells palsy, DM2, hx stroke/bells palsy  are also affecting patient's functional outcome.    REHAB POTENTIAL: Good   CLINICAL DECISION MAKING: Evolving/moderate complexity   EVALUATION COMPLEXITY: Moderate     GOALS: Goals reviewed with patient? No   SHORT TERM GOALS: Target date: 09/15/2022    Pt will be independent with HEP in order to improve strength and balance in order to decrease fall risk and improve function at home and work. Baseline: 08/18/22 Goal status: INITIAL     LONG TERM GOALS: Target date: 10/17/22   Patient will increase FOTO score to 53 to demonstrate predicted increase in functional mobility to complete ADLs Baseline: FOTO: 38 Goal status: INITIAL   2.  Pt will decrease worst pain as reported on NPRS by at least 3 points in order to demonstrate clinically significant reduction in pain. Baseline: 08/18/22 9/10 Goal status: INITIAL   3.  Pt will demonstrate at least 65d of cervical rotation in order to demonstrate mobility needed to scan environment Baseline: see eval Goal status: INITIAL    Next visit: Update HEP to include more ROM exercises.    Nolon Bussing, PT, DPT Physical Therapist - [facility]  09/03/22, 9:33 AM

## 2022-09-08 ENCOUNTER — Telehealth: Payer: Self-pay | Admitting: Pharmacist

## 2022-09-08 NOTE — Telephone Encounter (Signed)
Called patient to schedule an appointment for diabetes management. I was unable to reach the patient so I left a HIPAA-compliant message requesting that the patient return my call.   Of note, pt has MM and was identified via quality report as needing improved glycemic control. I saw him in 2022 and he achieved good control with Trulicity. Most recently, it looks like he had coverages issues and was switched to Jardiance and Ozempic. PA requests for these were recently approved.  Butch Penny, PharmD, Patsy Baltimore, CPP Clinical Pharmacist [facility] [phone]

## 2022-09-09 ENCOUNTER — Ambulatory Visit: Payer: Medicaid Other

## 2022-09-09 DIAGNOSIS — M6281 Muscle weakness (generalized): Secondary | ICD-10-CM

## 2022-09-09 DIAGNOSIS — M542 Cervicalgia: Secondary | ICD-10-CM

## 2022-09-09 DIAGNOSIS — M79602 Pain in left arm: Secondary | ICD-10-CM

## 2022-09-09 NOTE — Therapy (Signed)
OUTPATIENT PHYSICAL THERAPY TREATMENT NOTE   Patient Name: Matthew Jennings MRN: [id number] DOB:April 02, 1966, 57 y.o., male Today's Date: 09/09/2022  PCP: Ricky Stabs NP  REFERRING PROVIDER: Hoyt Koch MD   END OF SESSION:   PT End of Session - 09/09/22 0950     Visit Number 7    Number of Visits 17    Date for PT Re-Evaluation 10/17/22    Authorization - Visit Number 7    Authorization - Number of Visits 10    Progress Note Due on Visit 10    PT Start Time 0915    PT Stop Time 1000    PT Time Calculation (min) 45 min    Activity Tolerance Patient tolerated treatment well    Behavior During Therapy WFL for tasks assessed/performed               Past Medical History:  Diagnosis Date   Bell's palsy    left   DM (diabetes mellitus) (HCC)    Facial paralysis/Bells palsy    Hx of migraines    Past Surgical History:  Procedure Laterality Date   KNEE RECONSTRUCTION Left    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) 11/30/2020   Numbness and tingling in left hand 11/22/2020   Hearing loss of right ear due to cerumen impaction 08/10/2020   Hearing loss of left ear 08/10/2020   Balance problem 08/10/2020   Type 2 diabetes mellitus (HCC) 07/10/2020   Hyperlipidemia 07/10/2020   Bell's palsy 07/07/2020   Elevated blood-pressure reading without diagnosis of hypertension 07/07/2020    REFERRING DIAG: M54.12 (ICD-10-CM) - Cervical radiculopathy   THERAPY DIAG:  Cervicalgia  Muscle weakness (generalized)  Pain in left arm  Rationale for Evaluation and Treatment Rehabilitation  PERTINENT HISTORY:  Pt reports to PT with bilat cervical pain. 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 Reports he has numbness and tingling "all over his body". Cannot identify numbness/tingling pattern. Pain in neck feels sharp and catching when he is  sitting >62mins,  standing >22mins, sleeping, raising his LUE, turning his head side to side, and driving. Patient reports pain wakes him up from sleeping and he finds it difficult to find a comfortable position to sleep in. Current/best pain 4/10; worst 9/10. Pt has migraines with aura 2x/week.  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 Pt is DM2, reports it is usually under control. Pt denies N/V, B&B changes, unexplained weight fluctuation, saddle paresthesia, fever, night sweats, or unrelenting night pain at this time.   PRECAUTIONS: None  SUBJECTIVE:  SUBJECTIVE STATEMENT:  Pt reports he has not been able to sleep much over the past several days.  Pt still noting difficulty with grip in the L hand.    PAIN:  Are you having pain? Yes: NPRS scale: 0/10 Pain location: Neck Pain description: Sore/Stiff Aggravating factors: Weather   OBJECTIVE: (objective measures completed at initial evaluation unless otherwise dated)  DIAGNOSTIC FINDINGS:  09/18/22 C2-3 no spinal stenosis or foraminal narrowing C3-4 no spinal stenosis or foraminal narrowing C4-5 right ward disc bulging and uncovertebral joint hypertrophy with severe right foraminal stenosis C5-6 disc bulging and uncovertebral joint hypertrophy with mild spinal stenosis and severe bilateral foraminal stenosis C6-7 disc bulging and uncovertebral joint hypertrophy with moderate spinal stenosis and severe bilateral foraminal stenosis C7-T1 no spinal stenosis or foraminal narrowing   PATIENT SURVEYS:  FOTO needs to be set up by staff   COGNITION: Overall cognitive status: Within functional limits for tasks assessed   SENSATION: WFL   POSTURE: rounded shoulders, forward head, and increased thoracic kyphosis   PALPATION: TTP at bilat UT, levator, and  periscapular (mid trap/rhomboid group) musculature; no concordant pain just that "pain is all over"       CERVICAL ROM:    Active ROM A/PROM (deg) eval  Flexion 25% limited  Extension 55  Right lateral flexion 22  Left lateral flexion 27  Right rotation 57  Left rotation 48    All passive cervical motions WNL (Blank rows = not tested)   UPPER EXTREMITY ROM:   Active ROM Right eval Left eval  Shoulder flexion 180 112  Shoulder extension wnl wnl  Shoulder abduction 180 104  Shoulder adduction      Shoulder extension      Shoulder internal rotation Apleys T10 Apleys T12  Shoulder external rotation Apleys CTJ Apleys CTJ  Elbow flexion WNL WNL  Elbow extension WNL WNL  Wrist flexion      Wrist extension      Wrist ulnar deviation      Wrist radial deviation      Wrist pronation      Wrist supination         (Blank rows = not tested)   UPPER EXTREMITY MMT:   MMT Right eval Left eval  Shoulder flexion 5 4  Shoulder extension      Shoulder abduction 5 4+  Shoulder adduction      Shoulder extension      Shoulder internal rotation 5 4+  Shoulder external rotation 5  4+  Middle trapezius 5 4+  Lower trapezius 4+ 4  Elbow flexion      Elbow extension      Wrist flexion      Wrist extension      Wrist ulnar deviation      Wrist radial deviation      Wrist pronation      Wrist supination      Grip strength 65 33  Cervical isometrics strong in all directions    (Blank rows = not tested)   CERVICAL SPECIAL TESTS:  Neck flexor muscle endurance test: Negative, Spurling's test: Negative, and Distraction test: Negative DNF Endurance test: 10sec   ULTT R/L Median negative bilat Ulnar positive/negative Radial negative bilat       TODAY'S TREATMENT: DATE: 09/09/22   Manual:  Supine STM to cervical region to increase extensibility of the paraspinals Supine suboccipital release technique to decrease cervicalgia Supine manual traction performed in order to  increase joint space in cervical region for  pain relief Supine cervical upglides/downglides, 30 sec bouts Supine UT/Levator stretch, 30 sec bouts to increase tissue extensibility of the cervical region Supine STM with TP release technique applied to the UT's bilaterally for pain modulation and increased tissue extensibility Supine median/ulnar nerve glides with manual therapy, 2x10 each   TherEx:  Seated scapular rows at OMEGA, 25#, 2x10 Seated lat pull downs at OMEGA, 25#, 2x10     PATIENT EDUCATION: Education details: Patient was educated on diagnosis, anatomy and pathology involved, prognosis, role of PT, and was given an HEP, demonstrating exercise with proper form following verbal and tactile cues, and was given a paper hand out to continue exercise at home. Pt was educated on and agreed to plan of care.   Person educated: Patient Education method: Explanation, Demonstration, and Handouts Education comprehension: verbalized understanding and returned demonstration   HOME EXERCISE PROGRAM: [id number]   ASSESSMENT:   CLINICAL IMPRESSION:  Pt continues to display weakness in the UE's specifically with grip.  Pt concerned that it is going into his R UE now.  Pt has marked increase in ROM of the neck, however this is not improving his symptoms in the UE's like he was expecting.  Pt advised that if he is concerned about the progressive nature of the grip difficulties, he should consult with neurologist again.  Pt and therapist to discuss current POC again at next visit.   Pt will continue to benefit from skilled therapy to address remaining deficits in order to improve overall QoL and return to PLOF.         OBJECTIVE IMPAIRMENTS: Abnormal gait, cardiopulmonary status limiting activity, decreased balance, decreased endurance, decreased mobility, difficulty walking, decreased ROM, decreased strength, increased fascial restrictions, impaired flexibility, impaired UE functional use,  improper body mechanics, postural dysfunction, and pain.    ACTIVITY LIMITATIONS: carrying, lifting, sleeping, bathing, dressing, reach over head, and hygiene/grooming   PARTICIPATION LIMITATIONS: meal prep, cleaning, laundry, driving, shopping, community activity, and yard work   PERSONAL FACTORS: Education, Fitness, Time since onset of injury/illness/exacerbation, and 3+ comorbidities: migraines, bells palsy, DM2, hx stroke/bells palsy  are also affecting patient's functional outcome.    REHAB POTENTIAL: Good   CLINICAL DECISION MAKING: Evolving/moderate complexity   EVALUATION COMPLEXITY: Moderate     GOALS: Goals reviewed with patient? No   SHORT TERM GOALS: Target date: 09/15/2022    Pt will be independent with HEP in order to improve strength and balance in order to decrease fall risk and improve function at home and work. Baseline: 08/18/22 Goal status: INITIAL     LONG TERM GOALS: Target date: 10/17/22   Patient will increase FOTO score to 53 to demonstrate predicted increase in functional mobility to complete ADLs Baseline: FOTO: 38 Goal status: INITIAL   2.  Pt will decrease worst pain as reported on NPRS by at least 3 points in order to demonstrate clinically significant reduction in pain. Baseline: 08/18/22 9/10 Goal status: INITIAL   3.  Pt will demonstrate at least 65d of cervical rotation in order to demonstrate mobility needed to scan environment Baseline: see eval Goal status: INITIAL    Next visit: Update HEP to include more ROM exercises.    Nolon Bussing, PT, DPT Physical Therapist - [facility]  09/09/22, 11:09 AM

## 2022-09-11 ENCOUNTER — Ambulatory Visit: Payer: Medicaid Other | Attending: Neurosurgery

## 2022-09-11 DIAGNOSIS — R278 Other lack of coordination: Secondary | ICD-10-CM | POA: Diagnosis present

## 2022-09-11 DIAGNOSIS — M542 Cervicalgia: Secondary | ICD-10-CM | POA: Insufficient documentation

## 2022-09-11 DIAGNOSIS — M79602 Pain in left arm: Secondary | ICD-10-CM | POA: Insufficient documentation

## 2022-09-11 DIAGNOSIS — R208 Other disturbances of skin sensation: Secondary | ICD-10-CM | POA: Diagnosis present

## 2022-09-11 DIAGNOSIS — M6281 Muscle weakness (generalized): Secondary | ICD-10-CM | POA: Insufficient documentation

## 2022-09-11 NOTE — Therapy (Signed)
OUTPATIENT PHYSICAL THERAPY TREATMENT NOTE   Patient Name: Matthew Jennings MRN: [id number] DOB:12-05-1965, 57 y.o., male Today's Date: 09/11/2022  PCP: Ricky Stabs NP  REFERRING PROVIDER: Hoyt Koch MD   END OF SESSION:   PT End of Session - 09/11/22 0916     Visit Number 8    Number of Visits 17    Date for PT Re-Evaluation 10/17/22    Authorization - Visit Number 8    Authorization - Number of Visits 10    Progress Note Due on Visit 10    PT Start Time 0916    PT Stop Time 1000    PT Time Calculation (min) 44 min    Activity Tolerance Patient tolerated treatment well    Behavior During Therapy WFL for tasks assessed/performed               Past Medical History:  Diagnosis Date   Bell's palsy    left   DM (diabetes mellitus) (HCC)    Facial paralysis/Bells palsy    Hx of migraines    Past Surgical History:  Procedure Laterality Date   KNEE RECONSTRUCTION Left    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) 11/30/2020   Numbness and tingling in left hand 11/22/2020   Hearing loss of right ear due to cerumen impaction 08/10/2020   Hearing loss of left ear 08/10/2020   Balance problem 08/10/2020   Type 2 diabetes mellitus (HCC) 07/10/2020   Hyperlipidemia 07/10/2020   Bell's palsy 07/07/2020   Elevated blood-pressure reading without diagnosis of hypertension 07/07/2020    REFERRING DIAG: M54.12 (ICD-10-CM) - Cervical radiculopathy   THERAPY DIAG:  Cervicalgia  Muscle weakness (generalized)  Pain in left arm  Rationale for Evaluation and Treatment Rehabilitation  PERTINENT HISTORY:  Pt reports to PT with bilat cervical pain. 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 Reports he has numbness and tingling "all over his body". Cannot identify numbness/tingling pattern. Pain in neck feels sharp and catching when he is  sitting >67mins,  standing >22mins, sleeping, raising his LUE, turning his head side to side, and driving. Patient reports pain wakes him up from sleeping and he finds it difficult to find a comfortable position to sleep in. Current/best pain 4/10; worst 9/10. Pt has migraines with aura 2x/week.  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 Pt is DM2, reports it is usually under control. Pt denies N/V, B&B changes, unexplained weight fluctuation, saddle paresthesia, fever, night sweats, or unrelenting night pain at this time.   PRECAUTIONS: None  SUBJECTIVE:  SUBJECTIVE STATEMENT:  Pt wants to discuss continuing therapy or potentially going back to Dr. Maisie Fus to express concerns with loss of grip strength and coordination of the UE's.   PAIN:  Are you having pain? Yes: NPRS scale: 2-3/10 Pain location: Neck Pain description: Sore/Stiff Aggravating factors: Weather   OBJECTIVE: (objective measures completed at initial evaluation unless otherwise dated)  DIAGNOSTIC FINDINGS:  09/18/22 C2-3 no spinal stenosis or foraminal narrowing C3-4 no spinal stenosis or foraminal narrowing C4-5 right ward disc bulging and uncovertebral joint hypertrophy with severe right foraminal stenosis C5-6 disc bulging and uncovertebral joint hypertrophy with mild spinal stenosis and severe bilateral foraminal stenosis C6-7 disc bulging and uncovertebral joint hypertrophy with moderate spinal stenosis and severe bilateral foraminal stenosis C7-T1 no spinal stenosis or foraminal narrowing   PATIENT SURVEYS:  FOTO needs to be set up by staff   COGNITION: Overall cognitive status: Within functional limits for tasks assessed   SENSATION: WFL   POSTURE: rounded shoulders, forward head, and increased thoracic kyphosis    PALPATION: TTP at bilat UT, levator, and periscapular (mid trap/rhomboid group) musculature; no concordant pain just that "pain is all over"       CERVICAL ROM:    Active ROM A/PROM (deg) eval  Flexion 25% limited  Extension 55  Right lateral flexion 22  Left lateral flexion 27  Right rotation 57  Left rotation 48    All passive cervical motions WNL (Blank rows = not tested)   UPPER EXTREMITY ROM:   Active ROM Right eval Left eval  Shoulder flexion 180 112  Shoulder extension wnl wnl  Shoulder abduction 180 104  Shoulder adduction      Shoulder extension      Shoulder internal rotation Apleys T10 Apleys T12  Shoulder external rotation Apleys CTJ Apleys CTJ  Elbow flexion WNL WNL  Elbow extension WNL WNL  Wrist flexion      Wrist extension      Wrist ulnar deviation      Wrist radial deviation      Wrist pronation      Wrist supination         (Blank rows = not tested)   UPPER EXTREMITY MMT:   MMT Right eval Left eval  Shoulder flexion 5 4  Shoulder extension      Shoulder abduction 5 4+  Shoulder adduction      Shoulder extension      Shoulder internal rotation 5 4+  Shoulder external rotation 5  4+  Middle trapezius 5 4+  Lower trapezius 4+ 4  Elbow flexion      Elbow extension      Wrist flexion      Wrist extension      Wrist ulnar deviation      Wrist radial deviation      Wrist pronation      Wrist supination      Grip strength 65 33  Cervical isometrics strong in all directions    (Blank rows = not tested)   CERVICAL SPECIAL TESTS:  Neck flexor muscle endurance test: Negative, Spurling's test: Negative, and Distraction test: Negative DNF Endurance test: 10sec   ULTT R/L Median negative bilat Ulnar positive/negative Radial negative bilat       TODAY'S TREATMENT: DATE: 09/11/22    Right Hand Grip (lbs)  89 lbs  Left Hand Grip (lbs) 72 lbs  Left Hand Lateral Pinch 9 lbs   Left Hand 3 Point Pinch 11 lbs    Manual:  Supine  STM to cervical region to increase extensibility of the paraspinals Supine suboccipital release technique to decrease cervicalgia Supine manual traction performed in order to increase joint space in cervical region for pain relief Supine cervical upglides/downglides, 30 sec bouts Supine UT/Levator stretch, 30 sec bouts to increase tissue extensibility of the cervical region    TherEx:  Seated UBE, Level 4, 2.5 min forward/2.5 min backward Seated scapular rows at OMEGA, 25#, 2x10 Seated lat pull downs at OMEGA, 25#, 2x10  Extensive time spent with pt and educating on progress made up to this point.    PATIENT EDUCATION: Education details: Patient was educated on diagnosis, anatomy and pathology involved, prognosis, role of PT, and was given an HEP, demonstrating exercise with proper form following verbal and tactile cues, and was given a paper hand out to continue exercise at home. Pt was educated on and agreed to plan of care.   Person educated: Patient Education method: Explanation, Demonstration, and Handouts Education comprehension: verbalized understanding and returned demonstration   HOME EXERCISE PROGRAM: [id number]   ASSESSMENT:   CLINICAL IMPRESSION:  Pt reports that he would like to be seen by neurologist however had not received a call-back from the office.  Therapist sent a message to neurosurgeon per the pt request.  However after speaking with the pt extensively, a miscommunication occurred and after reassessing pt, pt noticed that his symptoms were resolving as therapy progressed.  Pt noted ROM to be much better, along with the numbness and tingling happening less frequently.  Pt still wants to be seen by MD for follow-up however.   Pt will continue to benefit from skilled therapy to address remaining deficits in order to improve overall QoL and return to PLOF.          OBJECTIVE IMPAIRMENTS: Abnormal gait, cardiopulmonary status limiting activity, decreased  balance, decreased endurance, decreased mobility, difficulty walking, decreased ROM, decreased strength, increased fascial restrictions, impaired flexibility, impaired UE functional use, improper body mechanics, postural dysfunction, and pain.    ACTIVITY LIMITATIONS: carrying, lifting, sleeping, bathing, dressing, reach over head, and hygiene/grooming   PARTICIPATION LIMITATIONS: meal prep, cleaning, laundry, driving, shopping, community activity, and yard work   PERSONAL FACTORS: Education, Fitness, Time since onset of injury/illness/exacerbation, and 3+ comorbidities: migraines, bells palsy, DM2, hx stroke/bells palsy  are also affecting patient's functional outcome.    REHAB POTENTIAL: Good   CLINICAL DECISION MAKING: Evolving/moderate complexity   EVALUATION COMPLEXITY: Moderate     GOALS: Goals reviewed with patient? No   SHORT TERM GOALS: Target date: 09/15/2022    Pt will be independent with HEP in order to improve strength and balance in order to decrease fall risk and improve function at home and work. Baseline: 08/18/22 Goal status: INITIAL     LONG TERM GOALS: Target date: 10/17/22   Patient will increase FOTO score to 53 to demonstrate predicted increase in functional mobility to complete ADLs Baseline: FOTO: 38 Goal status: INITIAL   2.  Pt will decrease worst pain as reported on NPRS by at least 3 points in order to demonstrate clinically significant reduction in pain. Baseline: 08/18/22 9/10 Goal status: INITIAL   3.  Pt will demonstrate at least 65d of cervical rotation in order to demonstrate mobility needed to scan environment Baseline: see eval Goal status: INITIAL    Next visit: Update HEP to include more ROM exercises.    Nolon Bussing, PT, DPT Physical Therapist - [facility]  09/11/22, 2:58 PM

## 2022-09-12 NOTE — Progress Notes (Unsigned)
Patient ID: OVEL GUBA, male    DOB: 04-22-1966  MRN: [id number]  CC: Diabetes Follow-Up  Subjective: Matthew Jennings is a 57 y.o. male who presents for diabetes follow-up.  His concerns today include:  Reports he is taking Metformin, Jardiance, and Ozempic as prescribed, no issues/concerns. Reports he doesn't have much of an appetite and has lost some weight related to Ozempic. No further issues/concerns for discussion today.   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 90 tablet 1   atorvastatin (LIPITOR) 40 MG tablet Take 1 tablet (40 mg total) by mouth daily. 90 tablet 0   Blood Glucose Monitoring Suppl (TRUE METRIX METER) w/Device KIT Use as directed 1 kit 0   clopidogrel (PLAVIX) 75 MG tablet Take 1 tablet (75 mg total) by mouth daily. 30 tablet 11   glucose blood (TRUE METRIX BLOOD GLUCOSE TEST) test strip Use as instructed 100 each 12   Insulin Pen Needle 31G X 8 MM MISC Use as directed 100 each 0   naproxen sodium (ALEVE) 220 MG tablet Take 220 mg by mouth.     naratriptan (AMERGE) 2.5 MG tablet Take 1 tablet (2.5 mg total) by mouth as needed for migraine. Take one (1) tablet at onset of headache; if returns or does not resolve, may repeat after 4 hours; do not exceed five (5) mg in 24 hours. 10 tablet 5   TRUEplus Lancets 28G MISC Use as directed 100 each  4   No current facility-administered medications on file prior to visit.    Allergies  Allergen Reactions   Aspirin Other (See Comments)   Penicillins Rash    Social History   Socioeconomic History   Marital status: Single    Spouse name: Not on file   Number of children: 0   Years of education: Not on file   Highest education level: Associate degree: occupational, Scientist, product/process development, or vocational program  Occupational History    Comment: EMT  Tobacco Use   Smoking status: Never    Passive exposure: Yes   Smokeless tobacco: Never  Substance and Sexual Activity   Alcohol use: Yes    Comment: occ   Drug use: Not Currently   Sexual activity: Not Currently  Other Topics Concern   Not on file  Social History Narrative   Lives alone   Social Determinants of Health   Financial Resource Strain: Not on file  Food Insecurity: Not on file  Transportation Needs: Not on file  Physical Activity: Not on file  Stress: Not on file  Social Connections: Not on file  Intimate Partner Violence: Not on file    Family History  Problem Relation Age of Onset   Cancer Mother    Alzheimer's disease Father     Past Surgical History:  Procedure Laterality Date   KNEE RECONSTRUCTION Left     ROS:  Review of Systems Negative except as stated above  PHYSICAL EXAM: BP 117/78   Pulse 78   Temp 97.8 F (36.6 C) (Oral)   Resp 16   Ht 5\' 11"  (1.803 m)   Wt 154 lb (69.9 kg)   SpO2 98%   BMI 21.48 kg/m   Wt Readings from Last 3 Encounters:  09/16/22 154 lb (69.9 kg)  08/18/22 163 lb (73.9 kg)  08/12/22 163 lb (73.9 kg)   Physical Exam HENT:     Head: Normocephalic and atraumatic.  Eyes:     Extraocular Movements: Extraocular movements intact.     Conjunctiva/sclera: Conjunctivae normal.     Pupils: Pupils are equal, round, and reactive to light.  Cardiovascular:     Rate and Rhythm: Normal rate and regular rhythm.     Pulses: Normal pulses.     Heart sounds: Normal heart  sounds.  Pulmonary:     Effort: Pulmonary effort is normal.     Breath sounds: Normal breath sounds.  Musculoskeletal:     Cervical back: Normal range of motion and neck supple.  Neurological:     General: No focal deficit present.     Mental Status: He is alert and oriented to person, place, and time.  Psychiatric:        Mood and Affect: Mood normal.        Behavior: Behavior normal.    Results for orders placed or performed in visit on 09/16/22  POCT glycosylated hemoglobin (Hb A1C)  Result Value Ref Range   Hemoglobin A1C     HbA1c POC (<> result, manual entry)     HbA1c, POC (prediabetic range)     HbA1c, POC (controlled diabetic range) 9.2 (A) 0.0 - 7.0 %    ASSESSMENT AND PLAN: 1. Type 2 diabetes mellitus without complication, without long-term current use of insulin (HCC) - Hemoglobin A1c not at goal at 9.2%, goal 7%. However, this is improved compared to previous 10.5%.  - Continue Metformin, Empagliflozin, and Semaglutide as prescribed.  - Begin Sitagliptin as prescribed. Counseled on medication adherence/adverse effects.  - Discussed the importance of healthy eating habits, low-carbohydrate diet, low-sugar diet, regular aerobic exercise (at least 150 minutes a week as tolerated) and medication compliance to achieve or maintain control of diabetes. - Follow-up with primary provider in 4 weeks or sooner if needed.  - POCT glycosylated hemoglobin (Hb A1C) - empagliflozin (JARDIANCE) 25 MG TABS tablet; Take 1 tablet (25 mg total) by mouth daily before breakfast.  Dispense: 90 tablet; Refill: 0 - sitaGLIPtin (JANUVIA) 25 MG tablet; Take 1 tablet (25 mg total) by mouth daily.  Dispense: 30 tablet; Refill: 0 - Semaglutide, 2 MG/DOSE, 8 MG/3ML SOPN; Inject 2 mg as directed once a week.  Dispense: 9 mL; Refill: 0 - metFORMIN (GLUCOPHAGE) 1000 MG tablet; Take 1 tablet (1,000 mg total) by mouth 2 (two) times daily with a meal.  Dispense: 180 tablet; Refill: 0  Patient was given  the opportunity to ask questions.  Patient verbalized understanding of the plan and was able to repeat key elements of the plan. Patient was given clear instructions to go to Emergency Department or return to medical center if symptoms don't improve, worsen, or new problems develop.The patient verbalized understanding.   Orders Placed This Encounter  Procedures   POCT glycosylated hemoglobin (Hb A1C)     Requested Prescriptions   Signed Prescriptions Disp Refills   empagliflozin (JARDIANCE) 25 MG TABS tablet 90 tablet 0    Sig:  Take 1 tablet (25 mg total) by mouth daily before breakfast.   sitaGLIPtin (JANUVIA) 25 MG tablet 30 tablet 0    Sig: Take 1 tablet (25 mg total) by mouth daily.   Semaglutide, 2 MG/DOSE, 8 MG/3ML SOPN 9 mL 0    Sig: Inject 2 mg as directed once a week.   metFORMIN (GLUCOPHAGE) 1000 MG tablet 180 tablet 0    Sig: Take 1 tablet (1,000 mg total) by mouth 2 (two) times daily with a meal.    Return in about 4 weeks (around 10/14/2022) for Follow-Up or next available chronic care mgmt .  Rema Fendt, NP

## 2022-09-15 ENCOUNTER — Ambulatory Visit: Payer: No Typology Code available for payment source | Admitting: Family

## 2022-09-16 ENCOUNTER — Ambulatory Visit (INDEPENDENT_AMBULATORY_CARE_PROVIDER_SITE_OTHER): Payer: Medicaid Other | Admitting: Family

## 2022-09-16 VITALS — BP 117/78 | HR 78 | Temp 97.8°F | Resp 16 | Ht 71.0 in | Wt 154.0 lb

## 2022-09-16 DIAGNOSIS — E119 Type 2 diabetes mellitus without complications: Secondary | ICD-10-CM

## 2022-09-16 DIAGNOSIS — Z7985 Long-term (current) use of injectable non-insulin antidiabetic drugs: Secondary | ICD-10-CM | POA: Diagnosis not present

## 2022-09-16 DIAGNOSIS — Z7984 Long term (current) use of oral hypoglycemic drugs: Secondary | ICD-10-CM | POA: Diagnosis not present

## 2022-09-16 LAB — POCT GLYCOSYLATED HEMOGLOBIN (HGB A1C): HbA1c, POC (controlled diabetic range): 9.2 % — AB (ref 0.0–7.0)

## 2022-09-16 MED ORDER — METFORMIN HCL 1000 MG PO TABS: 1000.0000 mg | ORAL_TABLET | Freq: Two times a day (BID) | ORAL | 0 refills | Status: DC

## 2022-09-16 MED ORDER — SITAGLIPTIN PHOSPHATE 25 MG PO TABS: 25.0000 mg | ORAL_TABLET | Freq: Every day | ORAL | 0 refills | Status: DC

## 2022-09-16 MED ORDER — SEMAGLUTIDE (2 MG/DOSE) 8 MG/3ML ~~LOC~~ SOPN: 2.0000 mg | PEN_INJECTOR | SUBCUTANEOUS | 0 refills | Status: DC

## 2022-09-16 MED ORDER — EMPAGLIFLOZIN 25 MG PO TABS: 25.0000 mg | ORAL_TABLET | Freq: Every day | ORAL | 0 refills | Status: DC

## 2022-09-16 NOTE — Progress Notes (Signed)
F/u DM A1C- 9.2

## 2022-09-17 ENCOUNTER — Ambulatory Visit: Payer: Medicaid Other

## 2022-09-22 ENCOUNTER — Ambulatory Visit: Payer: Medicaid Other

## 2022-09-25 ENCOUNTER — Telehealth: Payer: Self-pay | Admitting: Adult Health

## 2022-09-25 ENCOUNTER — Ambulatory Visit: Payer: Medicaid Other

## 2022-09-25 DIAGNOSIS — M542 Cervicalgia: Secondary | ICD-10-CM | POA: Diagnosis not present

## 2022-09-25 DIAGNOSIS — M79602 Pain in left arm: Secondary | ICD-10-CM

## 2022-09-25 DIAGNOSIS — R208 Other disturbances of skin sensation: Secondary | ICD-10-CM

## 2022-09-25 DIAGNOSIS — M6281 Muscle weakness (generalized): Secondary | ICD-10-CM

## 2022-09-25 DIAGNOSIS — R278 Other lack of coordination: Secondary | ICD-10-CM

## 2022-09-25 NOTE — Telephone Encounter (Signed)
Pt came into the office, states he is needing a letter for for DSS stating if pt is projected to be having disability, as in a disability that is permanent or short term. Also needing results from nerve conduction testing that was done.  Would like a phone call if this is not possible or if any questions. Would like to come pick up the letter in the office.  [phone]

## 2022-09-25 NOTE — Therapy (Signed)
OUTPATIENT PHYSICAL THERAPY TREATMENT NOTE   Patient Name: Matthew Jennings MRN: [id number] DOB:[birth year], 57 y.o., male Today's Date: 09/25/2022  PCP: Ricky Stabs NP  REFERRING PROVIDER: Hoyt Koch MD   END OF SESSION:   PT End of Session - 09/25/22 1038     Visit Number 9    Number of Visits 17    Date for PT Re-Evaluation 10/17/22    Authorization - Visit Number 9    Authorization - Number of Visits 10    Progress Note Due on Visit 10    PT Start Time 1035    PT Stop Time 1115    PT Time Calculation (min) 40 min    Activity Tolerance Patient tolerated treatment well    Behavior During Therapy WFL for tasks assessed/performed               Past Medical History:  Diagnosis Date   Bell's palsy    left   DM (diabetes mellitus) (HCC)    Facial paralysis/Bells palsy    Hx of migraines    Past Surgical History:  Procedure Laterality Date   KNEE RECONSTRUCTION Left    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) 11/30/2020   Numbness and tingling in left hand 11/22/2020   Hearing loss of right ear due to cerumen impaction 08/10/2020   Hearing loss of left ear 08/10/2020   Balance problem 08/10/2020   Type 2 diabetes mellitus (HCC) 07/10/2020   Hyperlipidemia 07/10/2020   Bell's palsy 07/07/2020   Elevated blood-pressure reading without diagnosis of hypertension 07/07/2020    REFERRING DIAG: M54.12 (ICD-10-CM) - Cervical radiculopathy   THERAPY DIAG:  Cervicalgia  Muscle weakness (generalized)  Pain in left arm  Other disturbances of skin sensation  Other lack of coordination  Rationale for Evaluation and Treatment Rehabilitation  PERTINENT HISTORY:  Pt reports to PT with bilat cervical pain. 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 Reports he has numbness and tingling "all over his body". Cannot identify  numbness/tingling pattern. Pain in neck feels sharp and catching when he is sitting >7mins,  standing >70mins, sleeping, raising his LUE, turning his head side to side, and driving. Patient reports pain wakes him up from sleeping and he finds it difficult to find a comfortable position to sleep in. Current/best pain 4/10; worst 9/10. Pt has migraines with aura 2x/week.  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 Pt is DM2, reports it is usually under control. Pt denies N/V, B&B changes, unexplained weight fluctuation, saddle paresthesia, fever, night sweats, or unrelenting night pain at this time.   PRECAUTIONS: None  SUBJECTIVE:  SUBJECTIVE STATEMENT:  Pt reports he is doing better today.  Pt notes a reduction in pain upon arrival.   PAIN:  Are you having pain? Yes: NPRS scale: 0/10 Pain location: Neck Pain description: Sore/Stiff Aggravating factors: Weather   OBJECTIVE: (objective measures completed at initial evaluation unless otherwise dated)  DIAGNOSTIC FINDINGS:  09/18/22 C2-3 no spinal stenosis or foraminal narrowing C3-4 no spinal stenosis or foraminal narrowing C4-5 right ward disc bulging and uncovertebral joint hypertrophy with severe right foraminal stenosis C5-6 disc bulging and uncovertebral joint hypertrophy with mild spinal stenosis and severe bilateral foraminal stenosis C6-7 disc bulging and uncovertebral joint hypertrophy with moderate spinal stenosis and severe bilateral foraminal stenosis C7-T1 no spinal stenosis or foraminal narrowing   PATIENT SURVEYS:  FOTO needs to be set up by staff   COGNITION: Overall cognitive status: Within functional limits for tasks assessed   SENSATION: WFL   POSTURE: rounded shoulders, forward head, and increased thoracic kyphosis    PALPATION: TTP at bilat UT, levator, and periscapular (mid trap/rhomboid group) musculature; no concordant pain just that "pain is all over"       CERVICAL ROM:    Active ROM A/PROM (deg) eval  Flexion 25% limited  Extension 55  Right lateral flexion 22  Left lateral flexion 27  Right rotation 57  Left rotation 48    All passive cervical motions WNL (Blank rows = not tested)   UPPER EXTREMITY ROM:   Active ROM Right eval Left eval  Shoulder flexion 180 112  Shoulder extension wnl wnl  Shoulder abduction 180 104  Shoulder adduction      Shoulder extension      Shoulder internal rotation Apleys T10 Apleys T12  Shoulder external rotation Apleys CTJ Apleys CTJ  Elbow flexion WNL WNL  Elbow extension WNL WNL  Wrist flexion      Wrist extension      Wrist ulnar deviation      Wrist radial deviation      Wrist pronation      Wrist supination         (Blank rows = not tested)   UPPER EXTREMITY MMT:   MMT Right eval Left eval  Shoulder flexion 5 4  Shoulder extension      Shoulder abduction 5 4+  Shoulder adduction      Shoulder extension      Shoulder internal rotation 5 4+  Shoulder external rotation 5  4+  Middle trapezius 5 4+  Lower trapezius 4+ 4  Elbow flexion      Elbow extension      Wrist flexion      Wrist extension      Wrist ulnar deviation      Wrist radial deviation      Wrist pronation      Wrist supination      Grip strength 65 33  Cervical isometrics strong in all directions    (Blank rows = not tested)   CERVICAL SPECIAL TESTS:  Neck flexor muscle endurance test: Negative, Spurling's test: Negative, and Distraction test: Negative DNF Endurance test: 10sec   ULTT R/L Median negative bilat Ulnar positive/negative Radial negative bilat       TODAY'S TREATMENT: DATE: 09/25/22    Manual:  Supine STM to cervical region to increase extensibility of the paraspinals Supine suboccipital release technique to decrease  cervicalgia Supine manual traction performed in order to increase joint space in cervical region for pain relief Supine cervical upglides/downglides, 30 sec bouts Supine UT/Levator  stretch, 30 sec bouts to increase tissue extensibility of the cervical region    TherEx:  Seated UBE, Level 4, 2.5 min forward/2.5 min backward Seated scapular rows at OMEGA, 25#, 2x10 Seated lat pull downs at OMEGA, 25#, 2x10 Seated chest press at OMEGA, 25#, 2x10 Standing red physioball rolls into forward flexion and lateral rolls, 2x10      PATIENT EDUCATION: Education details: Patient was educated on diagnosis, anatomy and pathology involved, prognosis, role of PT, and was given an HEP, demonstrating exercise with proper form following verbal and tactile cues, and was given a paper hand out to continue exercise at home. Pt was educated on and agreed to plan of care.   Person educated: Patient Education method: Explanation, Demonstration, and Handouts Education comprehension: verbalized understanding and returned demonstration   HOME EXERCISE PROGRAM: [id number]   ASSESSMENT:   CLINICAL IMPRESSION:  Pt continues to respond well to the manual therapy approach and the exercises listed above.  Pt is making improvements in the resistance portion of the exercises and completes all sets.  Pt still reporting significant numbness in the UE going into the hand and fingers, and is going to see neurology and neurosurgeon this afternoon in order to get disability form information.   Pt will continue to benefit from skilled therapy to address remaining deficits in order to improve overall QoL and return to PLOF.           OBJECTIVE IMPAIRMENTS: Abnormal gait, cardiopulmonary status limiting activity, decreased balance, decreased endurance, decreased mobility, difficulty walking, decreased ROM, decreased strength, increased fascial restrictions, impaired flexibility, impaired UE functional use, improper body  mechanics, postural dysfunction, and pain.    ACTIVITY LIMITATIONS: carrying, lifting, sleeping, bathing, dressing, reach over head, and hygiene/grooming   PARTICIPATION LIMITATIONS: meal prep, cleaning, laundry, driving, shopping, community activity, and yard work   PERSONAL FACTORS: Education, Fitness, Time since onset of injury/illness/exacerbation, and 3+ comorbidities: migraines, bells palsy, DM2, hx stroke/bells palsy  are also affecting patient's functional outcome.    REHAB POTENTIAL: Good   CLINICAL DECISION MAKING: Evolving/moderate complexity   EVALUATION COMPLEXITY: Moderate     GOALS: Goals reviewed with patient? No   SHORT TERM GOALS: Target date: 09/15/2022    Pt will be independent with HEP in order to improve strength and balance in order to decrease fall risk and improve function at home and work. Baseline: 08/18/22 Goal status: INITIAL     LONG TERM GOALS: Target date: 10/17/22   Patient will increase FOTO score to 53 to demonstrate predicted increase in functional mobility to complete ADLs Baseline: FOTO: 38 Goal status: INITIAL   2.  Pt will decrease worst pain as reported on NPRS by at least 3 points in order to demonstrate clinically significant reduction in pain. Baseline: 08/18/22 9/10 Goal status: INITIAL   3.  Pt will demonstrate at least 65d of cervical rotation in order to demonstrate mobility needed to scan environment Baseline: see eval Goal status: INITIAL    Next visit: Update HEP to include more ROM exercises.    Nolon Bussing, PT, DPT Physical Therapist - [facility]  09/25/22, 10:39 AM

## 2022-09-29 ENCOUNTER — Ambulatory Visit: Payer: Medicaid Other

## 2022-09-29 DIAGNOSIS — M79602 Pain in left arm: Secondary | ICD-10-CM

## 2022-09-29 DIAGNOSIS — M6281 Muscle weakness (generalized): Secondary | ICD-10-CM

## 2022-09-29 DIAGNOSIS — M542 Cervicalgia: Secondary | ICD-10-CM

## 2022-09-29 NOTE — Therapy (Signed)
OUTPATIENT PHYSICAL THERAPY TREATMENT NOTE   Patient Name: Matthew Jennings MRN: [id number] DOB:July 27, 1965, 57 y.o., male Today's Date: 09/29/2022  PCP: Ricky Stabs NP  REFERRING PROVIDER: Hoyt Koch MD   END OF SESSION:   PT End of Session - 09/29/22 0949     Visit Number 10    Number of Visits 17    Date for PT Re-Evaluation 10/17/22    Authorization - Visit Number 10    Authorization - Number of Visits 10    Progress Note Due on Visit 20    PT Start Time 0950    PT Stop Time 1030    PT Time Calculation (min) 40 min    Activity Tolerance Patient tolerated treatment well    Behavior During Therapy WFL for tasks assessed/performed               Past Medical History:  Diagnosis Date   Bell's palsy    left   DM (diabetes mellitus) (HCC)    Facial paralysis/Bells palsy    Hx of migraines    Past Surgical History:  Procedure Laterality Date   KNEE RECONSTRUCTION Left    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) 11/30/2020   Numbness and tingling in left hand 11/22/2020   Hearing loss of right ear due to cerumen impaction 08/10/2020   Hearing loss of left ear 08/10/2020   Balance problem 08/10/2020   Type 2 diabetes mellitus (HCC) 07/10/2020   Hyperlipidemia 07/10/2020   Bell's palsy 07/07/2020   Elevated blood-pressure reading without diagnosis of hypertension 07/07/2020    REFERRING DIAG: M54.12 (ICD-10-CM) - Cervical radiculopathy   THERAPY DIAG:  Cervicalgia  Muscle weakness (generalized)  Pain in left arm  Rationale for Evaluation and Treatment Rehabilitation  PERTINENT HISTORY:  Pt reports to PT with bilat cervical pain. 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 Reports he has numbness and tingling "all over his body". Cannot identify numbness/tingling pattern. Pain in neck feels sharp and catching when he is  sitting >36mins,  standing >49mins, sleeping, raising his LUE, turning his head side to side, and driving. Patient reports pain wakes him up from sleeping and he finds it difficult to find a comfortable position to sleep in. Current/best pain 4/10; worst 9/10. Pt has migraines with aura 2x/week.  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 Pt is DM2, reports it is usually under control. Pt denies N/V, B&B changes, unexplained weight fluctuation, saddle paresthesia, fever, night sweats, or unrelenting night pain at this time.   PRECAUTIONS: None  SUBJECTIVE:  SUBJECTIVE STATEMENT:  Pt reports having a headache filled weekend.  Pt notes he was unable to sleep much over the past few days due to the weather changes and it causing his headaches.  He notes his neck is still stiff and "like popcorn".   PAIN:  Are you having pain? Yes: NPRS scale: 0/10 Pain location: Neck Pain description: Sore/Stiff Aggravating factors: Weather   OBJECTIVE: (objective measures completed at initial evaluation unless otherwise dated)  DIAGNOSTIC FINDINGS:  09/18/22 C2-3 no spinal stenosis or foraminal narrowing C3-4 no spinal stenosis or foraminal narrowing C4-5 right ward disc bulging and uncovertebral joint hypertrophy with severe right foraminal stenosis C5-6 disc bulging and uncovertebral joint hypertrophy with mild spinal stenosis and severe bilateral foraminal stenosis C6-7 disc bulging and uncovertebral joint hypertrophy with moderate spinal stenosis and severe bilateral foraminal stenosis C7-T1 no spinal stenosis or foraminal narrowing   PATIENT SURVEYS:  FOTO needs to be set up by staff   COGNITION: Overall cognitive status: Within functional limits for tasks assessed   SENSATION: WFL   POSTURE: rounded shoulders,  forward head, and increased thoracic kyphosis   PALPATION: TTP at bilat UT, levator, and periscapular (mid trap/rhomboid group) musculature; no concordant pain just that "pain is all over"       CERVICAL ROM:    Active ROM A/PROM (deg) eval AROM 09/29/22  Flexion 25% limited 39 deg  Extension 55 42  Right lateral flexion 22 34  Left lateral flexion 27 32  Right rotation 57 61  Left rotation 48 65    All passive cervical motions WNL (Blank rows = not tested)   UPPER EXTREMITY ROM:   Active ROM Right eval Left eval  Shoulder flexion 180 112  Shoulder extension wnl wnl  Shoulder abduction 180 104  Shoulder adduction      Shoulder extension      Shoulder internal rotation Apleys T10 Apleys T12  Shoulder external rotation Apleys CTJ Apleys CTJ  Elbow flexion WNL WNL  Elbow extension WNL WNL  Wrist flexion      Wrist extension      Wrist ulnar deviation      Wrist radial deviation      Wrist pronation      Wrist supination         (Blank rows = not tested)   UPPER EXTREMITY MMT:   MMT Right eval Left eval  Shoulder flexion 5 4  Shoulder extension      Shoulder abduction 5 4+  Shoulder adduction      Shoulder extension      Shoulder internal rotation 5 4+  Shoulder external rotation 5  4+  Middle trapezius 5 4+  Lower trapezius 4+ 4  Elbow flexion      Elbow extension      Wrist flexion      Wrist extension      Wrist ulnar deviation      Wrist radial deviation      Wrist pronation      Wrist supination      Grip strength 65 33  Cervical isometrics strong in all directions    (Blank rows = not tested)   CERVICAL SPECIAL TESTS:  Neck flexor muscle endurance test: Negative, Spurling's test: Negative, and Distraction test: Negative DNF Endurance test: 10sec   ULTT R/L Median negative bilat Ulnar positive/negative Radial negative bilat       TODAY'S TREATMENT: DATE: 09/29/22   Manual:  Supine STM to cervical region to increase extensibility  of the paraspinals Supine suboccipital release technique to decrease cervicalgia Supine manual traction performed in order to increase joint space in cervical region for pain relief Supine cervical upglides/downglides, 30 sec bouts Supine UT/Levator stretch, 30 sec bouts to increase tissue extensibility of the cervical region  Goal assessment performed and noted above:     PATIENT EDUCATION: Education details: Patient was educated on diagnosis, anatomy and pathology involved, prognosis, role of PT, and was given an HEP, demonstrating exercise with proper form following verbal and tactile cues, and was given a paper hand out to continue exercise at home. Pt was educated on and agreed to plan of care.   Person educated: Patient Education method: Explanation, Demonstration, and Handouts Education comprehension: verbalized understanding and returned demonstration   HOME EXERCISE PROGRAM: [id number]   ASSESSMENT:   CLINICAL IMPRESSION:  Pt continues to improve with overall ROM of the neck and painful symptoms as well.  PT still notes that he lacks strength and balance of the L side, however due to PT orders, pt is limited to neck and L arm symptoms.  Pt continues to note how much of an improvement he has made with therapy and it is getting better with driving as well as other tasks meaningful to pt.  Patient's condition has the potential to improve in response to therapy. Maximum improvement is yet to be obtained. The anticipated improvement is attainable and reasonable in a generally predictable time.   Pt will continue to benefit from skilled therapy to address remaining deficits in order to improve overall QoL and return to PLOF.            OBJECTIVE IMPAIRMENTS: Abnormal gait, cardiopulmonary status limiting activity, decreased balance, decreased endurance, decreased mobility, difficulty walking, decreased ROM, decreased strength, increased fascial restrictions, impaired flexibility,  impaired UE functional use, improper body mechanics, postural dysfunction, and pain.    ACTIVITY LIMITATIONS: carrying, lifting, sleeping, bathing, dressing, reach over head, and hygiene/grooming   PARTICIPATION LIMITATIONS: meal prep, cleaning, laundry, driving, shopping, community activity, and yard work   PERSONAL FACTORS: Education, Fitness, Time since onset of injury/illness/exacerbation, and 3+ comorbidities: migraines, bells palsy, DM2, hx stroke/bells palsy  are also affecting patient's functional outcome.    REHAB POTENTIAL: Good   CLINICAL DECISION MAKING: Evolving/moderate complexity   EVALUATION COMPLEXITY: Moderate     GOALS: Goals reviewed with patient? No   SHORT TERM GOALS: Target date: 09/15/2022    Pt will be independent with HEP in order to improve strength and balance in order to decrease fall risk and improve function at home and work. Baseline: 08/18/22 Goal status: INITIAL     LONG TERM GOALS: Target date: 10/17/22   Patient will increase FOTO score to 53 to demonstrate predicted increase in functional mobility to complete ADLs Baseline: FOTO: 38 09/29/22: 52 Goal status: NOT MET   2.  Pt will decrease worst pain as reported on NPRS by at least 3 points in order to demonstrate clinically significant reduction in pain. Baseline: 08/18/22 9/10 09/29/22: 3-4 Goal status: MET   3.  Pt will demonstrate at least 65d of cervical rotation in order to demonstrate mobility needed to scan environment Baseline: see eval 09/29/22: R/L Cervical Rotation: 61/65 Goal status: NOT MET  4.  Pt will demonstrate improved grip strength of the L LE to be within 10% (59#) of the R. Baseline: 33# Goal status: INITIAL   Next visit: Update HEP to include more ROM exercises.    Nolon Bussing, PT, DPT Physical Therapist -  [facility]  [facility]  09/29/22, 12:55 PM

## 2022-10-02 ENCOUNTER — Ambulatory Visit: Payer: Medicaid Other

## 2022-10-07 ENCOUNTER — Ambulatory Visit: Payer: Medicaid Other

## 2022-10-07 DIAGNOSIS — M79602 Pain in left arm: Secondary | ICD-10-CM

## 2022-10-07 DIAGNOSIS — M6281 Muscle weakness (generalized): Secondary | ICD-10-CM

## 2022-10-07 DIAGNOSIS — M542 Cervicalgia: Secondary | ICD-10-CM

## 2022-10-07 NOTE — Therapy (Signed)
OUTPATIENT PHYSICAL THERAPY TREATMENT NOTE   Patient Name: Matthew Jennings MRN: [id number] DOB:[birth year], 57 y.o., male Today's Date: 10/07/2022  PCP: Ricky Stabs NP  REFERRING PROVIDER: Hoyt Koch MD   END OF SESSION:   PT End of Session - 10/07/22 0933     Visit Number 11    Number of Visits 17    Date for PT Re-Evaluation 10/17/22    Authorization - Visit Number 11    Authorization - Number of Visits 17    Progress Note Due on Visit 20    PT Start Time 0933    PT Stop Time 1015    PT Time Calculation (min) 42 min    Activity Tolerance Patient tolerated treatment well    Behavior During Therapy WFL for tasks assessed/performed               Past Medical History:  Diagnosis Date   Bell's palsy    left   DM (diabetes mellitus) (HCC)    Facial paralysis/Bells palsy    Hx of migraines    Past Surgical History:  Procedure Laterality Date   KNEE RECONSTRUCTION Left    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) 11/30/2020   Numbness and tingling in left hand 11/22/2020   Hearing loss of right ear due to cerumen impaction 08/10/2020   Hearing loss of left ear 08/10/2020   Balance problem 08/10/2020   Type 2 diabetes mellitus (HCC) 07/10/2020   Hyperlipidemia 07/10/2020   Bell's palsy 07/07/2020   Elevated blood-pressure reading without diagnosis of hypertension 07/07/2020    REFERRING DIAG: M54.12 (ICD-10-CM) - Cervical radiculopathy   THERAPY DIAG:  Cervicalgia  Muscle weakness (generalized)  Pain in left arm  Rationale for Evaluation and Treatment Rehabilitation  PERTINENT HISTORY:  Pt reports to PT with bilat cervical pain. 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 Reports he has numbness and tingling "all over his body". Cannot identify numbness/tingling pattern. Pain in neck feels sharp and catching when he is  sitting >51mins,  standing >8mins, sleeping, raising his LUE, turning his head side to side, and driving. Patient reports pain wakes him up from sleeping and he finds it difficult to find a comfortable position to sleep in. Current/best pain 4/10; worst 9/10. Pt has migraines with aura 2x/week.  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 Pt is DM2, reports it is usually under control. Pt denies N/V, B&B changes, unexplained weight fluctuation, saddle paresthesia, fever, night sweats, or unrelenting night pain at this time.   PRECAUTIONS: None  SUBJECTIVE:  SUBJECTIVE STATEMENT:  Pt reports that he is still dealing with migraines and had one last night.  Pt    PAIN:  Are you having pain? Yes: NPRS scale: 0/10 Pain location: Neck Pain description: Sore/Stiff Aggravating factors: Weather   OBJECTIVE: (objective measures completed at initial evaluation unless otherwise dated)  DIAGNOSTIC FINDINGS:  09/18/22 C2-3 no spinal stenosis or foraminal narrowing C3-4 no spinal stenosis or foraminal narrowing C4-5 right ward disc bulging and uncovertebral joint hypertrophy with severe right foraminal stenosis C5-6 disc bulging and uncovertebral joint hypertrophy with mild spinal stenosis and severe bilateral foraminal stenosis C6-7 disc bulging and uncovertebral joint hypertrophy with moderate spinal stenosis and severe bilateral foraminal stenosis C7-T1 no spinal stenosis or foraminal narrowing   PATIENT SURVEYS:  FOTO needs to be set up by staff   COGNITION: Overall cognitive status: Within functional limits for tasks assessed   SENSATION: WFL   POSTURE: rounded shoulders, forward head, and increased thoracic kyphosis   PALPATION: TTP at bilat UT, levator, and periscapular (mid trap/rhomboid group)  musculature; no concordant pain just that "pain is all over"       CERVICAL ROM:    Active ROM A/PROM (deg) eval AROM 09/29/22  Flexion 25% limited 39 deg  Extension 55 42  Right lateral flexion 22 34  Left lateral flexion 27 32  Right rotation 57 61  Left rotation 48 65    All passive cervical motions WNL (Blank rows = not tested)   UPPER EXTREMITY ROM:   Active ROM Right eval Left eval  Shoulder flexion 180 112  Shoulder extension wnl wnl  Shoulder abduction 180 104  Shoulder adduction      Shoulder extension      Shoulder internal rotation Apleys T10 Apleys T12  Shoulder external rotation Apleys CTJ Apleys CTJ  Elbow flexion WNL WNL  Elbow extension WNL WNL  Wrist flexion      Wrist extension      Wrist ulnar deviation      Wrist radial deviation      Wrist pronation      Wrist supination         (Blank rows = not tested)   UPPER EXTREMITY MMT:   MMT Right eval Left eval  Shoulder flexion 5 4  Shoulder extension      Shoulder abduction 5 4+  Shoulder adduction      Shoulder extension      Shoulder internal rotation 5 4+  Shoulder external rotation 5  4+  Middle trapezius 5 4+  Lower trapezius 4+ 4  Elbow flexion      Elbow extension      Wrist flexion      Wrist extension      Wrist ulnar deviation      Wrist radial deviation      Wrist pronation      Wrist supination      Grip strength 65 33  Cervical isometrics strong in all directions    (Blank rows = not tested)   CERVICAL SPECIAL TESTS:  Neck flexor muscle endurance test: Negative, Spurling's test: Negative, and Distraction test: Negative DNF Endurance test: 10sec   ULTT R/L Median negative bilat Ulnar positive/negative Radial negative bilat       TODAY'S TREATMENT: DATE: 10/07/22    TherEx:  Standing ulnar nerve tensioner exercises at wall, 30 sec bouts Standing median nerve tensioner exercises at wall, 30 sec bouts Standing wall angels, improving posture and ROM of the  shoulders, 2x10 Seated  scapular retraction at OMEGA,  Standing TRX push-ups, 2x10 Standing TRX pull-up, 2x10 Supine chin tucks, 2x10 Seated towel snags with cervical extension, 2x10      PATIENT EDUCATION: Education details: Patient was educated on diagnosis, anatomy and pathology involved, prognosis, role of PT, and was given an HEP, demonstrating exercise with proper form following verbal and tactile cues, and was given a paper hand out to continue exercise at home. Pt was educated on and agreed to plan of care.   Person educated: Patient Education method: Explanation, Demonstration, and Handouts Education comprehension: verbalized understanding and returned demonstration   HOME EXERCISE PROGRAM: [id number]   ASSESSMENT:   CLINICAL IMPRESSION:  Pt responded well to the therapeutic exercises, noting an improvement in his ROM of the neck and the L arm.  Pt noted to be feeling less symptoms as well in the L side of the body after performing.  Pt will continue to benefit from manual therapy approaches and introduction of more therapeutic exercises as therapy progresses.   Pt will continue to benefit from skilled therapy to address remaining deficits in order to improve overall QoL and return to PLOF.             OBJECTIVE IMPAIRMENTS: Abnormal gait, cardiopulmonary status limiting activity, decreased balance, decreased endurance, decreased mobility, difficulty walking, decreased ROM, decreased strength, increased fascial restrictions, impaired flexibility, impaired UE functional use, improper body mechanics, postural dysfunction, and pain.    ACTIVITY LIMITATIONS: carrying, lifting, sleeping, bathing, dressing, reach over head, and hygiene/grooming   PARTICIPATION LIMITATIONS: meal prep, cleaning, laundry, driving, shopping, community activity, and yard work   PERSONAL FACTORS: Education, Fitness, Time since onset of injury/illness/exacerbation, and 3+ comorbidities: migraines,  bells palsy, DM2, hx stroke/bells palsy  are also affecting patient's functional outcome.    REHAB POTENTIAL: Good   CLINICAL DECISION MAKING: Evolving/moderate complexity   EVALUATION COMPLEXITY: Moderate     GOALS: Goals reviewed with patient? No   SHORT TERM GOALS: Target date: 09/15/2022    Pt will be independent with HEP in order to improve strength and balance in order to decrease fall risk and improve function at home and work. Baseline: 08/18/22 Goal status: INITIAL     LONG TERM GOALS: Target date: 10/17/22   Patient will increase FOTO score to 53 to demonstrate predicted increase in functional mobility to complete ADLs Baseline: FOTO: 38 09/29/22: 52 Goal status: NOT MET   2.  Pt will decrease worst pain as reported on NPRS by at least 3 points in order to demonstrate clinically significant reduction in pain. Baseline: 08/18/22 9/10 09/29/22: 3-4 Goal status: MET   3.  Pt will demonstrate at least 65d of cervical rotation in order to demonstrate mobility needed to scan environment Baseline: see eval 09/29/22: R/L Cervical Rotation: 61/65 Goal status: NOT MET  4.  Pt will demonstrate improved grip strength of the L LE to be within 10% (59#) of the R. Baseline: 33# Goal status: INITIAL   Next visit: Update HEP, continue with strengthening exercises as radicular symptoms are not causing weakness as much as prior.      Nolon Bussing, PT, DPT Physical Therapist - [facility]  10/07/22, 9:33 AM

## 2022-10-09 ENCOUNTER — Ambulatory Visit: Payer: Medicaid Other

## 2022-10-09 DIAGNOSIS — M6281 Muscle weakness (generalized): Secondary | ICD-10-CM

## 2022-10-09 DIAGNOSIS — M79602 Pain in left arm: Secondary | ICD-10-CM

## 2022-10-09 DIAGNOSIS — M542 Cervicalgia: Secondary | ICD-10-CM | POA: Diagnosis not present

## 2022-10-09 NOTE — Telephone Encounter (Signed)
Pt has called to f/u on his request to a letter re: SSID, the response from [facility], CMA was gone over with him.  Phone rep then asked if he ever reached out to [facility] .  Pt said he was told by Darl Pikes, RN there that since he was initially seen here, it would have to be [facility] to provide the letter. Pt is asking for a call to discuss this further.

## 2022-10-09 NOTE — Therapy (Signed)
OUTPATIENT PHYSICAL THERAPY TREATMENT NOTE   Patient Name: Matthew Jennings MRN: [id number] DOB:1965-12-12, 57 y.o., male Today's Date: 10/09/2022  PCP: Ricky Stabs NP  REFERRING PROVIDER: Hoyt Koch MD   END OF SESSION:   PT End of Session - 10/09/22 0858     Visit Number 12    Number of Visits 17    Date for PT Re-Evaluation 10/17/22    Authorization - Visit Number 12    Authorization - Number of Visits 17    Progress Note Due on Visit 20    PT Start Time 0900    PT Stop Time 0945    PT Time Calculation (min) 45 min    Activity Tolerance Patient tolerated treatment well    Behavior During Therapy WFL for tasks assessed/performed              Past Medical History:  Diagnosis Date   Bell's palsy    left   DM (diabetes mellitus) (HCC)    Facial paralysis/Bells palsy    Hx of migraines    Past Surgical History:  Procedure Laterality Date   KNEE RECONSTRUCTION Left    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) 11/30/2020   Numbness and tingling in left hand 11/22/2020   Hearing loss of right ear due to cerumen impaction 08/10/2020   Hearing loss of left ear 08/10/2020   Balance problem 08/10/2020   Type 2 diabetes mellitus (HCC) 07/10/2020   Hyperlipidemia 07/10/2020   Bell's palsy 07/07/2020   Elevated blood-pressure reading without diagnosis of hypertension 07/07/2020    REFERRING DIAG: M54.12 (ICD-10-CM) - Cervical radiculopathy   THERAPY DIAG:  Cervicalgia  Muscle weakness (generalized)  Pain in left arm  Rationale for Evaluation and Treatment Rehabilitation  PERTINENT HISTORY:  Pt reports to PT with bilat cervical pain. 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 Reports he has numbness and tingling "all over his body". Cannot identify numbness/tingling pattern. Pain in neck feels sharp and catching when he is  sitting >16mins,  standing >9mins, sleeping, raising his LUE, turning his head side to side, and driving. Patient reports pain wakes him up from sleeping and he finds it difficult to find a comfortable position to sleep in. Current/best pain 4/10; worst 9/10. Pt has migraines with aura 2x/week.  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 Pt is DM2, reports it is usually under control. Pt denies N/V, B&B changes, unexplained weight fluctuation, saddle paresthesia, fever, night sweats, or unrelenting night pain at this time.   PRECAUTIONS: None  SUBJECTIVE:  SUBJECTIVE STATEMENT:  Pt reports he has been up and moving around for the past 2 hours, so his pain has improved.  Pt notes having a decent night's rest last night.   PAIN:  Are you having pain? Yes: NPRS scale: 0-2/10 Pain location: Neck Pain description: Sore/Stiff Aggravating factors: Weather   OBJECTIVE: (objective measures completed at initial evaluation unless otherwise dated)  DIAGNOSTIC FINDINGS:  09/18/22 C2-3 no spinal stenosis or foraminal narrowing C3-4 no spinal stenosis or foraminal narrowing C4-5 right ward disc bulging and uncovertebral joint hypertrophy with severe right foraminal stenosis C5-6 disc bulging and uncovertebral joint hypertrophy with mild spinal stenosis and severe bilateral foraminal stenosis C6-7 disc bulging and uncovertebral joint hypertrophy with moderate spinal stenosis and severe bilateral foraminal stenosis C7-T1 no spinal stenosis or foraminal narrowing   PATIENT SURVEYS:  FOTO needs to be set up by staff   COGNITION: Overall cognitive status: Within functional limits for tasks assessed   SENSATION: WFL   POSTURE: rounded shoulders, forward head, and increased thoracic kyphosis   PALPATION: TTP at bilat  UT, levator, and periscapular (mid trap/rhomboid group) musculature; no concordant pain just that "pain is all over"       CERVICAL ROM:    Active ROM A/PROM (deg) eval AROM 09/29/22  Flexion 25% limited 39 deg  Extension 55 42  Right lateral flexion 22 34  Left lateral flexion 27 32  Right rotation 57 61  Left rotation 48 65    All passive cervical motions WNL (Blank rows = not tested)   UPPER EXTREMITY ROM:   Active ROM Right eval Left eval  Shoulder flexion 180 112  Shoulder extension wnl wnl  Shoulder abduction 180 104  Shoulder adduction      Shoulder extension      Shoulder internal rotation Apleys T10 Apleys T12  Shoulder external rotation Apleys CTJ Apleys CTJ  Elbow flexion WNL WNL  Elbow extension WNL WNL  Wrist flexion      Wrist extension      Wrist ulnar deviation      Wrist radial deviation      Wrist pronation      Wrist supination         (Blank rows = not tested)   UPPER EXTREMITY MMT:   MMT Right eval Left eval  Shoulder flexion 5 4  Shoulder extension      Shoulder abduction 5 4+  Shoulder adduction      Shoulder extension      Shoulder internal rotation 5 4+  Shoulder external rotation 5  4+  Middle trapezius 5 4+  Lower trapezius 4+ 4  Elbow flexion      Elbow extension      Wrist flexion      Wrist extension      Wrist ulnar deviation      Wrist radial deviation      Wrist pronation      Wrist supination      Grip strength 65 33  Cervical isometrics strong in all directions    (Blank rows = not tested)   CERVICAL SPECIAL TESTS:  Neck flexor muscle endurance test: Negative, Spurling's test: Negative, and Distraction test: Negative DNF Endurance test: 10sec   ULTT R/L Median negative bilat Ulnar positive/negative Radial negative bilat       TODAY'S TREATMENT: DATE: 10/09/22  TherEx:  Seated scapular retractions at OMEGA, 35#, 2x10 Seated lat pull downs at OMEGA, 35#, 3x10 Seated chest press at OMEGA, 35#,  3x10  Standing wall circles with green physioball, CW/CCW, 30 sec bouts each direction Standing wall slides with use of foam roller, 2x10 Standing ulnar nerve tensioner exercises at wall, 30 sec bouts Standing median nerve tensioner exercises at wall, 30 sec bouts Standing wall angels, improving posture and ROM of the shoulders, 2x10 Standing TRX push-ups, 2x10 Standing TRX pull-up, 2x10 Supine chin tucks, 2x10 Seated towel snags with cervical extension, 2x10      PATIENT EDUCATION: Education details: Patient was educated on diagnosis, anatomy and pathology involved, prognosis, role of PT, and was given an HEP, demonstrating exercise with proper form following verbal and tactile cues, and was given a paper hand out to continue exercise at home. Pt was educated on and agreed to plan of care.   Person educated: Patient Education method: Explanation, Demonstration, and Handouts Education comprehension: verbalized understanding and returned demonstration   HOME EXERCISE PROGRAM: [id number]   ASSESSMENT:   CLINICAL IMPRESSION:  Pt continues to improve with all tasks and is able to improve with reps and resistance levels.  Pt does still support having numbness and tingling in the L UE, but notes it is getting slightly better.  Pt notes he is still struggling with getting disability paperwork from [employer] at this time and is likely not going to get the paperwork submitted in time before his taxes are due.  Pt otherwise is making improvements in ability to perform tasks and will continue to benefit from skilled therapy to address remaining deficits in order to improve overall QoL and return to PLOF.          OBJECTIVE IMPAIRMENTS: Abnormal gait, cardiopulmonary status limiting activity, decreased balance, decreased endurance, decreased mobility, difficulty walking, decreased ROM, decreased strength, increased fascial restrictions, impaired flexibility, impaired UE functional use, improper body  mechanics, postural dysfunction, and pain.    ACTIVITY LIMITATIONS: carrying, lifting, sleeping, bathing, dressing, reach over head, and hygiene/grooming   PARTICIPATION LIMITATIONS: meal prep, cleaning, laundry, driving, shopping, community activity, and yard work   PERSONAL FACTORS: Education, Fitness, Time since onset of injury/illness/exacerbation, and 3+ comorbidities: migraines, bells palsy, DM2, hx stroke/bells palsy  are also affecting patient's functional outcome.    REHAB POTENTIAL: Good   CLINICAL DECISION MAKING: Evolving/moderate complexity   EVALUATION COMPLEXITY: Moderate     GOALS: Goals reviewed with patient? No   SHORT TERM GOALS: Target date: 09/15/2022    Pt will be independent with HEP in order to improve strength and balance in order to decrease fall risk and improve function at home and work. Baseline: 08/18/22 Goal status: INITIAL     LONG TERM GOALS: Target date: 10/17/22   Patient will increase FOTO score to 53 to demonstrate predicted increase in functional mobility to complete ADLs Baseline: FOTO: 38 09/29/22: 52 Goal status: NOT MET   2.  Pt will decrease worst pain as reported on NPRS by at least 3 points in order to demonstrate clinically significant reduction in pain. Baseline: 08/18/22 9/10 09/29/22: 3-4 Goal status: MET   3.  Pt will demonstrate at least 65d of cervical rotation in order to demonstrate mobility needed to scan environment Baseline: see eval 09/29/22: R/L Cervical Rotation: 61/65 Goal status: NOT MET  4.  Pt will demonstrate improved grip strength of the L LE to be within 10% (59#) of the R. Baseline: 33# Goal status: INITIAL   Next visit: Update HEP, continue with strengthening exercises as radicular symptoms are not causing weakness as much as prior.      Nolon Bussing,  PT, DPT Physical Therapist - [place]  [facility]  10/09/22, 10:11 AM

## 2022-10-13 NOTE — Telephone Encounter (Signed)
Contacted pt to discuss, LVM rq call back

## 2022-10-14 NOTE — Telephone Encounter (Addendum)
Pt returned call, stated he needs a letter regarding his condition and disability. I informed him I am unable to provide a letter stating he is projected to have a disability without a confining Dx. I can send him a letter stating he is under our care and what he is being seen for. He can also get any records that is needed to attach to the letter to help his case. He asked if I can add the plan and diagnoses from [facility] to the letter. I informed him he will need to get that info from them. I will draft a letter and send to him via mychart tomorrow to verify what I can provide will be useful. He verbally understood and was appreciative.

## 2022-10-15 NOTE — Telephone Encounter (Signed)
Contacted pt, LVM rq call back  

## 2022-10-17 NOTE — Progress Notes (Unsigned)
Patient ID: CLARNCE HOMAN, male    DOB: 1965/06/01  MRN: [id number]  CC: Chronic Care Management   Subjective: Matthew Jennings is a 57 y.o. male who presents for chronic care management.   His concerns today include:  Doing well on Metformin, Jardiance, Ozempic, and Januvia, no issues/concerns. No further issues/concerns for discussion today.   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 90 tablet 1   atorvastatin (LIPITOR) 40 MG tablet Take 1 tablet (40 mg total) by mouth daily. 90 tablet 0   Blood Glucose Monitoring Suppl (TRUE METRIX METER) w/Device KIT Use as directed 1 kit 0   clopidogrel (PLAVIX) 75 MG tablet Take 1 tablet (75 mg total) by mouth daily. 30 tablet 11   glucose blood (TRUE METRIX BLOOD GLUCOSE TEST) test strip Use as instructed 100 each 12   Insulin Pen Needle 31G X 8 MM MISC Use as directed 100 each 0   naproxen sodium (ALEVE) 220 MG tablet Take 220 mg by mouth.     naratriptan (AMERGE) 2.5 MG tablet Take 1 tablet (2.5 mg total) by mouth as needed for migraine. Take one (1) tablet at onset of headache; if returns or does not resolve, may repeat after 4 hours; do not exceed five (5) mg in 24 hours. 10 tablet 5   sulfamethoxazole-trimethoprim (BACTRIM DS) 800-160 MG tablet Take 1 tablet every 12 hours by oral route for 7 days.     TRUEplus Lancets  28G MISC Use as directed 100 each 4   No current facility-administered medications on file prior to visit.    Allergies  Allergen Reactions   Aspirin Other (See Comments)   Penicillins Rash    Social History   Socioeconomic History   Marital status: Single    Spouse name: Not on file   Number of children: 0   Years of education: Not on file   Highest education level: Associate degree: occupational, Scientist, product/process development, or vocational program  Occupational History    Comment: EMT  Tobacco Use   Smoking status: Never    Passive exposure: Yes   Smokeless tobacco: Never  Substance and Sexual Activity   Alcohol use: Yes    Comment: occ   Drug use: Not Currently   Sexual activity: Not Currently  Other Topics Concern   Not on file  Social History Narrative   Lives alone   Social Determinants of Health   Financial Resource Strain: Not on file  Food Insecurity: Not on file  Transportation Needs: Not on file  Physical Activity: Not on file  Stress: Not on file  Social Connections: Not on file  Intimate Partner Violence: Not on file    Family History  Problem Relation Age of Onset   Cancer Mother    Alzheimer's disease Father     Past Surgical History:  Procedure Laterality Date  KNEE RECONSTRUCTION Left     ROS: Review of Systems Negative except as stated above  PHYSICAL EXAM: BP 127/82   Pulse 81   Temp 98.1 F (36.7 C) (Oral)   Resp 16   Wt 150 lb (68 kg)   SpO2 98%   BMI 20.92 kg/m   Physical Exam HENT:     Head: Normocephalic and atraumatic.  Eyes:     Extraocular Movements: Extraocular movements intact.     Conjunctiva/sclera: Conjunctivae normal.     Pupils: Pupils are equal, round, and reactive to light.  Cardiovascular:     Rate and Rhythm: Normal rate and regular rhythm.     Pulses: Normal pulses.     Heart sounds: Normal heart sounds.  Pulmonary:     Effort: Pulmonary effort is normal.     Breath sounds: Normal breath sounds.   Musculoskeletal:     Cervical back: Normal range of motion and neck supple.  Neurological:     General: No focal deficit present.     Mental Status: He is alert and oriented to person, place, and time.  Psychiatric:        Mood and Affect: Mood normal.        Behavior: Behavior normal.      Results for orders placed or performed in visit on 10/20/22  POCT glycosylated hemoglobin (Hb A1C)  Result Value Ref Range   Hemoglobin A1C 7.0 (A) 4.0 - 5.6 %   HbA1c POC (<> result, manual entry)     HbA1c, POC (prediabetic range)     HbA1c, POC (controlled diabetic range)       ASSESSMENT AND PLAN: 1. Type 2 diabetes mellitus without complication, without long-term current use of insulin (HCC) - Hemoglobin A1c at goal at 7.0%, goal 7.0%. This is improved compared to previous 9.2%.  - Continue Metformin, Empagliflozin, Sitagliptin, and Semaglutide as prescribed. - Discussed the importance of healthy eating habits, low-carbohydrate diet, low-sugar diet, regular aerobic exercise (at least 150 minutes a week as tolerated) and medication compliance to achieve or maintain control of diabetes. - Follow-up with primary provider in 3 months or sooner if needed.  - POCT glycosylated hemoglobin (Hb A1C) - Semaglutide, 2 MG/DOSE, 8 MG/3ML SOPN; Inject 2 mg as directed once a week.  Dispense: 12 mL; Refill: 0 - empagliflozin (JARDIANCE) 25 MG TABS tablet; Take 1 tablet (25 mg total) by mouth daily before breakfast.  Dispense: 90 tablet; Refill: 0 - metFORMIN (GLUCOPHAGE) 1000 MG tablet; Take 1 tablet (1,000 mg total) by mouth 2 (two) times daily with a meal.  Dispense: 180 tablet; Refill: 0 - sitaGLIPtin (JANUVIA) 25 MG tablet; Take 1 tablet (25 mg total) by mouth daily.  Dispense: 90 tablet; Refill: 0    Patient was given the opportunity to ask questions.  Patient verbalized understanding of the plan and was able to repeat key elements of the plan. Patient was given clear instructions to go to  Emergency Department or return to medical center if symptoms don't improve, worsen, or new problems develop.The patient verbalized understanding.   Orders Placed This Encounter  Procedures   POCT glycosylated hemoglobin (Hb A1C)     Requested Prescriptions   Signed Prescriptions Disp Refills   Semaglutide, 2 MG/DOSE, 8 MG/3ML SOPN 12 mL 0    Sig: Inject 2 mg as directed once a week.   empagliflozin (JARDIANCE) 25 MG TABS tablet 90 tablet 0    Sig: Take 1 tablet (25 mg total) by mouth daily before  breakfast.   metFORMIN (GLUCOPHAGE) 1000 MG tablet 180 tablet 0    Sig: Take 1 tablet (1,000 mg total) by mouth 2 (two) times daily with a meal.   sitaGLIPtin (JANUVIA) 25 MG tablet 90 tablet 0    Sig: Take 1 tablet (25 mg total) by mouth daily.    Return in about 3 months (around 01/20/2023) for Follow-Up or next available chronic care mgmt .  Rema Fendt, NP

## 2022-10-20 ENCOUNTER — Ambulatory Visit (INDEPENDENT_AMBULATORY_CARE_PROVIDER_SITE_OTHER): Payer: Medicaid Other | Admitting: Family

## 2022-10-20 VITALS — BP 127/82 | HR 81 | Temp 98.1°F | Resp 16 | Wt 150.0 lb

## 2022-10-20 DIAGNOSIS — E119 Type 2 diabetes mellitus without complications: Secondary | ICD-10-CM | POA: Diagnosis not present

## 2022-10-20 DIAGNOSIS — Z7984 Long term (current) use of oral hypoglycemic drugs: Secondary | ICD-10-CM

## 2022-10-20 DIAGNOSIS — Z7985 Long-term (current) use of injectable non-insulin antidiabetic drugs: Secondary | ICD-10-CM | POA: Diagnosis not present

## 2022-10-20 LAB — POCT GLYCOSYLATED HEMOGLOBIN (HGB A1C): Hemoglobin A1C: 7 % — AB (ref 4.0–5.6)

## 2022-10-20 MED ORDER — EMPAGLIFLOZIN 25 MG PO TABS: 25.0000 mg | ORAL_TABLET | Freq: Every day | ORAL | 0 refills | Status: AC

## 2022-10-20 MED ORDER — METFORMIN HCL 1000 MG PO TABS: 1000.0000 mg | ORAL_TABLET | Freq: Two times a day (BID) | ORAL | 0 refills | Status: DC

## 2022-10-20 MED ORDER — SITAGLIPTIN PHOSPHATE 25 MG PO TABS: 25.0000 mg | ORAL_TABLET | Freq: Every day | ORAL | 0 refills | Status: DC

## 2022-10-20 MED ORDER — SEMAGLUTIDE (2 MG/DOSE) 8 MG/3ML ~~LOC~~ SOPN: 2.0000 mg | PEN_INJECTOR | SUBCUTANEOUS | 0 refills | Status: DC

## 2022-12-03 DIAGNOSIS — Z0271 Encounter for disability determination: Secondary | ICD-10-CM

## 2023-01-01 ENCOUNTER — Telehealth: Payer: Self-pay | Admitting: Family

## 2023-01-01 NOTE — Telephone Encounter (Signed)
Copied from CRM [id number]. Topic: General - Inquiry >> Jan 01, 2023 11:52 AM De Blanch wrote: Reason for CRM: Lovett Sox from [employer] [facility] stated he is helping pt with a housing for closure issue, would like to discuss with PCP a possible certification for disability.  Would like to speak with Ricky Stabs directly stated it's not your typical form request.   Arlys John requesting a callback.

## 2023-01-02 NOTE — Telephone Encounter (Signed)
Dr. Andrey Campanile please advise. Thank you.

## 2023-01-08 ENCOUNTER — Telehealth: Payer: Self-pay | Admitting: Family

## 2023-01-08 NOTE — Telephone Encounter (Signed)
Madelyn Brunner with [employer] is calling in because pt has a tax delinquency on his home and [employer] is trying to help with this. Sharlet Salina said there is a disability form that could help in his case and wanted to know would Amy look over the form. Please follow up with Sharlet Salina at [phone].

## 2023-01-09 ENCOUNTER — Telehealth: Payer: Self-pay | Admitting: Family

## 2023-01-13 NOTE — Telephone Encounter (Signed)
Pt is calling in because he received a call from the office. Pt says that neurology told him Amy would need to complete the form because she is the provider that sent the first referral. Pt is requesting Amy complete the form as soon as possible. Pt states that the form is needed so he can get disability. PT says to call him if there are any questions, or if additional information is needed.

## 2023-01-14 ENCOUNTER — Telehealth: Payer: Self-pay | Admitting: Family

## 2023-01-14 NOTE — Telephone Encounter (Signed)
Patient established with Neurology and there has been no qualifying disability noted. Also, it is noted during office visit 06/02/2022 that patient declined stroke workup and physical therapy referral recommended by Neurology. I will be unable to complete requested [facility] Certification of Disability. Please note I did consult with my supervising physician Georganna Skeans, MD.

## 2023-01-14 NOTE — Telephone Encounter (Signed)
Copied from CRM [id number]. Topic: General - Other >> Jan 14, 2023  4:21 PM Phill Myron wrote: Mr. Mathews Robinsons returning Anthony's call regarding housing paperwork

## 2023-01-14 NOTE — Telephone Encounter (Signed)
Left voicemail to call office back to relay what Amy said to [employer].

## 2023-01-15 NOTE — Telephone Encounter (Signed)
Called office back to talk about patient and inform him of what Amy has stated.

## 2023-01-19 DIAGNOSIS — M4802 Spinal stenosis, cervical region: Secondary | ICD-10-CM | POA: Diagnosis not present

## 2023-01-19 DIAGNOSIS — G5622 Lesion of ulnar nerve, left upper limb: Secondary | ICD-10-CM | POA: Diagnosis not present

## 2023-01-22 NOTE — Progress Notes (Signed)
[facility]  PATIENT: Matthew Jennings DOB: [birth year]  REFERRING CLINICIAN: Rema Fendt, NP HISTORY FROM: patient  REASON FOR VISIT: Bell's Palsy, left-sided numbness   HISTORICAL  CHIEF COMPLAINT:  Chief Complaint  Patient presents with   Follow-up    Rm 3, here alone  Pt is here for follow up. Pt states that he continues with left sided/weakness, states is having on right side now. States continues with balance. States just saw the Dr Maisie Fus neurosurgeon and surgery was discussed but not scheduled. States still having headaches/migraines.     HISTORY OF PRESENT ILLNESS:    Update 01/26/2023 JM: Patient returns for follow-up visit.  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  Referral placed to orthopedic and neurosurgery.  Reports continued left-sided weakness gradually worsening and now affecting right side. Reports more recently after resting right arm on table at chest height, will start to have pain in shoulder that radiates to ear and jaw. Reports worked with PT with some improvement but after stopping, symptoms returned and has been gradually worsening.  Recently had follow-up with neurosurgery Dr. Maisie Fus, OV note reviewed, due to failure of medical and physical therapy and progressive nature of symptoms resulting in functional loss, recommended pursuing surgical intervention in form of C4-5, C5-6, C6-7 ACDF as well as left cubital tunnel release and likely need to consider right cubital tunnel release in the future.  Patient questions pursuing surgery at this point, concerned that he  was only seen by Dr. Maisie Fus twice and had no further testing or imaging prior to recommending surgical intervention.   Reports continued migraine headaches, about 2-3 per month, usually has at least 1 more severe migraine.  Remains on amitriptyline 100 mg nightly for both migraine prophylaxis and neuropathic pain. Use of naratriptan but does cause fatigue, usually falls asleep and will wake up without migraine. C/o difficult time sleeping due to pain and unable to get comfortable.   Denies any other neurological or new stroke symptoms.  Remains on Plavix and atorvastatin.  Routinely follows with PCP for stroke risk factor management.     History provided for reference purposes only Update 06/02/2022 JM: Patient returns for 2-month follow-up.  He continues to experience left-sided numbness and weakness which has been gradually worsening since prior visit. Does have mild RLE numbness distally but this has been stable. He has difficulty holding or picking up objects and has been having more difficulty with his left leg giving out at times while ambulating, he does questions some weakness in LLE. Denies lower back pain. Has had difficulty sleeping due to pain especially in his neck and left shoulder. Has been previously referred to neurosurgery and orthopedics for cervical radiculopathy with difficulty finding office that will accept his insurance but has been recently approved for Medicaid.  He continues to apply for disability.  Migraines have improved since prior visit, about 3 migraines per month. Current use of amitriptyline 75 mg nightly. Was unable to try naratriptan due to co-pay.  He is interested in trying that he has Medicaid.  Denies any new stroke or neurological symptoms.  Remains on Plavix and atorvastatin.  Blood pressure well-controlled.  Has not had recent follow-up  with PCP.  Update 11/25/2021 JM: Patient returns for 68-month follow-up unaccompanied.  Left arm weakness/numbness  persist. Continued difficulty with using left hand. Does have left shoulder pain. Reports numbness/pain especially from elbow to outer hand. Continued neck pain. Can have difficulty sleeping at night.  Completed MR cervical spine which showed severe bilateral foraminal stenosis and disc bulging at C5-6 and C6-7, referral placed to neurosurgery but per patient, refused to see patient due to being covered under [facility].   Continued left leg numbness and imbalance. Continued b/l foot numbness.   Migraines still 2-3x per week but severe migraine about 1x per week which can be debilitating, did see much benefit after increasing amitriptyline dosage at prior visit. He does take nightly but has also been taking as needed for acute management in addition to naproxen. Migraines typically resolve after 30 to 60 minutes. Previously prescribed naratriptan as intolerance to rizatriptan and sumatriptan but reports he never received prescription.  Compliant on Plavix and atorvastatin. Denies any new stroke type symptoms. Blood pressure today 129/93.  Prior follow-up with PCP 6 months ago.  He is in the process of applying for Medicaid, reports needs to apply for disability first. Trying to collect all prior medical information as advised by social security.   MRI cervical spine 09/16/2021 IMPRESSION:  - At C6-7 disc bulging and uncovertebral joint hypertrophy with moderate spinal stenosis and severe bilateral foraminal stenosis. No cord signal changes. - At C5-6 disc bulging and uncovertebral joint hypertrophy with mild spinal stenosis and severe bilateral foraminal stenosis. No cord signal changes. - At C4-5 right ward disc bulging and uncovertebral joint hypertrophy with severe right foraminal stenosis.   UPDATE 08/26/2021 JM: Returns for 43-month follow-up unaccompanied.  Reports worsening of left hand weakness more so with digit 1 and 5, greater difficulty making a fist and dropping things, does have long  standing hx of neck pain which has also been progressing, can radiate down in to his shoulder and down the back side of his left arm, usually worse in the morning. Also reports continued imbalance and left leg numbness. Continues bilateral foot numbness, bottom of feet, stable. Continued migraines approximately 2-3x per week, currently on amitriptyline 25 mg nightly, will use Maxalt which can help take the edge off but unable to take during the day due to fatigue, will use naproxen during the day if needed with benefit if he takes soon enough. Does not overuse naproxen. 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 Hx of Bell's palsy, resolved.  Remains on Plavix and atorvastatin, denies side effects.  Blood pressure today 137/87.  A1c 9.0 (05/2021) with PCP making adjustments to diabetic regimen. No further concerns at this time.   UPDATE 02/18/2021 JM: Mr. Arminio returns for 6 months follow-up.  No residual facial weakness or reoccurring of symptoms.  Continued left hand weakness with numbness (ring and pinky finger and occasional thumb) with difficulty picking up smaller objects and left leg numbness with leg giving out leading to imbalance.  No AD and no falls. Denies any associated pain such as in neck, lower back, hip or knee.  Also reports occasional slurred speech especially when trying to speak quicker.  All symptoms persistent since 05/2020.  Denies new stroke/TIA symptoms.  Reports improvement of bilateral foot numbness as glucose levels improved (most recent A1c 7.4 down from 10.4).  Migraine headaches improved since prior visit - only took 1 month of amitriptyline as he was not aware he had  refills.  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 He has been doing cooking at home (like he would at work) without difficulty.  Remains on atorvastatin 40 mg daily.  Blood pressure today 126/86.  No further concerns at this time.   Initial consult visit 08/20/2020 Dr.  Marjory Lies: 57 year old male here for evaluation of Bell's palsy.  January 2022 patient had onset of left facial weakness, went to the ER for evaluation.  He was also having left hand numbness at the time and had MRI of the brain which showed enhancement of the left facial nerve.  No cause for left hand numbness was found.  Patient followed up with ENT and Bell's palsy symptoms have improved.  Patient continues to have left hand numbness and weakness, mainly affecting digits 1 and 5.  Patient also has bilateral toe and feet numbness.  Patient has new diagnosis of diabetes, to started medications about 1 month ago.  A1c is greater than 11.  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  Patient has history of migraine since age 40 years old with global intense squeezing throbbing sensation, sensitive to light and sound, triggered by weather changes.  Having 3 headaches per week.  He has tried Aleve and Keppra in the past without relief.    REVIEW OF SYSTEMS: Full 14 system review of systems performed and negative with exception of: as per HPI.   ALLERGIES: Allergies  Allergen Reactions   Aspirin Other (See Comments)   Penicillins Rash    HOME MEDICATIONS: Outpatient Medications Prior to Visit  Medication Sig Dispense Refill   Blood Glucose Monitoring Suppl (TRUE METRIX METER) w/Device KIT Use as directed 1 kit 0   clopidogrel (PLAVIX) 75 MG tablet Take 1 tablet (75 mg total) by mouth daily. 30 tablet 11   glucose blood (TRUE METRIX BLOOD GLUCOSE TEST) test strip Use as instructed 100 each 12   Insulin Pen Needle 31G X 8 MM MISC Use as directed 100 each 0   naproxen sodium (ALEVE) 220 MG tablet Take 220 mg by mouth.     Semaglutide, 2 MG/DOSE, 8 MG/3ML SOPN Inject 2 mg as directed once a week. 12 mL 0   sulfamethoxazole-trimethoprim (BACTRIM DS) 800-160 MG tablet Take 1 tablet every 12 hours by oral route for 7 days.      TRUEplus Lancets 28G MISC Use as directed 100 each 4   amitriptyline (ELAVIL) 100 MG tablet Take 1 tablet (100 mg total) by mouth at bedtime. 90 tablet 1   naratriptan (AMERGE) 2.5 MG tablet Take 1 tablet (2.5 mg total) by mouth as needed for migraine. Take one (1) tablet at onset of headache; if returns or does not resolve, may repeat after 4 hours; do not exceed five (5) mg in 24 hours. 10 tablet 5   atorvastatin (LIPITOR) 40 MG tablet Take 1 tablet (40 mg total) by mouth daily. 90 tablet 0   metFORMIN (GLUCOPHAGE) 1000 MG tablet Take 1 tablet (1,000 mg total) by mouth 2 (two) times daily with a meal. 180 tablet 0   sitaGLIPtin (JANUVIA) 25 MG tablet Take 1 tablet (25 mg total) by mouth daily. 90 tablet 0   No facility-administered medications prior to visit.    PAST MEDICAL HISTORY: Past Medical History:  Diagnosis Date   Bell's palsy    left   DM (diabetes mellitus) (HCC)    Facial paralysis/Bells palsy  Hx of migraines     PAST SURGICAL HISTORY: Past Surgical History:  Procedure Laterality Date   KNEE RECONSTRUCTION Left     FAMILY HISTORY: Family History  Problem Relation Age of Onset   Cancer Mother    Alzheimer's disease Father     SOCIAL HISTORY: Social History   Socioeconomic History   Marital status: Single    Spouse name: Not on file   Number of children: 0   Years of education: Not on file   Highest education level: Associate degree: occupational, Scientist, product/process development, or vocational program  Occupational History    Comment: EMT  Tobacco Use   Smoking status: Never    Passive exposure: Yes   Smokeless tobacco: Never  Substance and Sexual Activity   Alcohol use: Yes    Comment: occ   Drug use: Not Currently   Sexual activity: Not Currently  Other Topics Concern   Not on file  Social History Narrative   Lives alone   Social Determinants of Health   Financial Resource Strain: Not on file  Food Insecurity: Not on file  Transportation Needs: Not on file   Physical Activity: Not on file  Stress: Not on file  Social Connections: Not on file  Intimate Partner Violence: Not on file     PHYSICAL EXAM  GENERAL EXAM/CONSTITUTIONAL: Vitals:  Vitals:   01/26/23 0827  BP: 139/82  Pulse: 61  Weight: 161 lb (73 kg)  Height: 5\' 11"  (1.803 m)    Body mass index is 22.45 kg/m. Wt Readings from Last 3 Encounters:  01/26/23 161 lb (73 kg)  10/20/22 150 lb (68 kg)  09/16/22 154 lb (69.9 kg)   Patient is in no distress; very pleasant middle-age Caucasian male, well developed, nourished and groomed; neck is supple  CARDIOVASCULAR: Examination of carotid arteries is normal; no carotid bruits Regular rate and rhythm, no murmurs Examination of peripheral vascular system by observation and palpation is normal  EYES: Ophthalmoscopic exam of optic discs and posterior segments is normal; no papilledema or hemorrhages  MUSCULOSKELETAL: Gait, strength, tone, movements noted in Neurologic exam below  NEUROLOGIC: MENTAL STATUS:  awake, alert, oriented to person, place and time recent and remote memory intact normal attention and concentration language fluent, comprehension intact, naming intact fund of knowledge appropriate  CRANIAL NERVE:  2nd - no papilledema on fundoscopic exam 2nd, 3rd, 4th, 6th - pupils equal and reactive to light, visual fields full to confrontation, extraocular muscles intact, no nystagmus 5th - facial sensation symmetric 7th - facial strength symmetric 8th - hearing intact 9th - palate elevates symmetrically, uvula midline 11th - shoulder shrug symmetric 12th - tongue protrusion midline  MOTOR:  LUE 4/5 WEAKNESS THROUGHOUT AND LLE 4+/5 HF AND ADF, MILD ATROPHY AND WEAKNESS LEFT HAND INTRINSIC MUSCLES. RUE 4+5, DIFFICULTY MAKING COMPLETE FIST. RLE 5/5  SENSORY:  normal symmetrically to vibration; DECREASED LIGHT TOUCH AND PP SENSATION RUE>LUE AND LLE AND DECR PP IN LEFT HAND, PIN/NEEDLE SENSATION LIGHT TOUCH  BLE  COORDINATION:  finger-nose-finger mild incoordination of LUE, decreased RIGHT>LEFT fine finger movements  REFLEXES:  deep tendon reflexes TRACE and symmetric  GAIT/STATION:  narrow based gait with mild imbalance with decreased LLE stride length and step height without use of assistive device. Able to stand from seated position with arms crossed.  Romberg positive     DIAGNOSTIC DATA (LABS, IMAGING, TESTING) - I reviewed patient records, labs, notes, testing and imaging myself where available.  Lab Results  Component Value Date  WBC 6.7 04/01/2022   HGB 14.0 04/01/2022   HCT 42.0 04/01/2022   MCV 85.2 04/01/2022   PLT 201 04/01/2022      Component Value Date/Time   NA 141 04/01/2022 1440   NA 144 05/30/2021 0927   K 4.1 04/01/2022 1440   CL 109 04/01/2022 1440   CO2 26 04/01/2022 1440   GLUCOSE 116 (H) 04/01/2022 1440   BUN 26 (H) 04/01/2022 1440   BUN 14 05/30/2021 0927   CREATININE 0.81 04/01/2022 1440   CALCIUM 9.4 04/01/2022 1440   PROT 8.3 (H) 05/17/2020 1416   ALBUMIN 4.6 05/17/2020 1416   AST 23 05/17/2020 1416   ALT 33 05/17/2020 1416   ALKPHOS 99 05/17/2020 1416   BILITOT 0.6 05/17/2020 1416   GFRNONAA >60 04/01/2022 1440   Lab Results  Component Value Date   CHOL 125 02/26/2021   HDL 45 02/26/2021   LDLCALC 64 02/26/2021   TRIG 81 02/26/2021   CHOLHDL 2.8 02/26/2021   Lab Results  Component Value Date   HGBA1C 7.0 (A) 10/20/2022   Lab Results  Component Value Date   VITAMINB12 529 06/02/2022   Lab Results  Component Value Date   TSH 3.010 06/02/2022    05/17/20 MRI brain / IAC  - Prominent asymmetric enhancement of the left seventh cranial nerve extending from the IAC fundus through the mastoid segment. Findings are compatible with Bell's palsy in the appropriate clinical setting. Should the patient's symptoms persist or follow an unexpected course, follow-up brain MRI with contrast and with IAC protocol recommended. - T2  hyperintensity and enhancement within the left mandibular condyle, nonspecific but possibly degenerative or inflammatory in etiology. Clinical correlation is recommended.  MRI CERVICAL SPINE WO CONTRAST 09/16/2021 IMPRESSION:  MRI cervical spine (without) demonstrating: - At C6-7 disc bulging and uncovertebral joint hypertrophy with moderate spinal stenosis and severe bilateral foraminal stenosis. No cord signal changes. - At C5-6 disc bulging and uncovertebral joint hypertrophy with mild spinal stenosis and severe bilateral foraminal stenosis. No cord signal changes. - At C4-5 right ward disc bulging and uncovertebral joint hypertrophy with severe right foraminal stenosis.        ASSESSMENT AND PLAN  57 y.o. year old male here with:  Dx:  1. Left hand weakness   2. Left-sided weakness   3. Left sided numbness       PLAN:   LEFT AND RIGHT SIDED WEAKNESS AND PARESTHESIAS BILATERAL HAND NUMBNESS (likely d/t severe cervical stenosis, peripheral neuropathy; carpal tunnel syndrome + ulnar neuropathy) GAIT IMPAIRMENT/IMBALANCE   -Further discussed surgical intervention for severe cervical stenosis as advised by Dr. Maisie Fus. Patient hesitant to proceed with surgery due to no additional work up by neurosurgery. Did discuss indication for surgical recommendation based on prior MRI and EMG/NCV in setting of progressive symptoms.  Advised prior work up including MRI c-spine and EMG/NCV has been completed and does not necessarily need repeat imaging prior to surgical recommendations. Did discuss pursuing second opinion, he will further consider but wishes to further discuss with PCP.  -in the process of applying for Social Security disability -EMG/NCV 07/2022 severe ulnar neuropathy at left and moderate on the right, severe distal lower extremity axonal polyneuropathy, mild upper extremity polyneuropathy, chronic neurogenic changes in left muscles that share C6 innervation consistent with MRI  finding -MR cervical 09/16/2021 severe bilateral foraminal stenosis and disc bulging at C5-6 and C6-7 and severe right foraminal stenosis C4-5  -Increase amitriptyline to 125 mg nightly for continued pain and sleep.  Can further discuss sleep difficulty and other recommendations with PCP at follow up visit next week.  -Previously questioned right sided stroke not seen on imaging contributing to symptoms but based on prior MRI C-spine and more recent EMG/NCV, left-sided weakness and paresthesias more likely in setting of severe cervical spinal stenosis, carpal tunnel syndrome and ulnar neuropathy. For now, can continue Plavix and atorvastatin for stroke prevention but can consider if ongoing need indicated at future f/u visits.  Continue to follow with PCP for aggressive stroke risk factor management   MIGRAINE WITH AURA -overall well controlled, currently 2-3 per month -continue amitriptyline but will increase to 125mg  due to pain and sleep complaints -try Nurtec as needed for rescue, failed rizatriptan, sumatriptan and naratriptan   LEFT BELL'S PALSY (resolved) - continue supportive care    Follow-up in 6 months or call earlier if needed    Meds ordered this encounter  Medications   Rimegepant Sulfate (NURTEC) 75 MG TBDP    Sig: Take 1 tablet (75 mg total) by mouth as needed (migraine rescue).    Dispense:  12 tablet    Refill:  11   amitriptyline (ELAVIL) 100 MG tablet    Sig: Take 1 tablet (100 mg total) by mouth at bedtime.    Dispense:  90 tablet    Refill:  1   amitriptyline (ELAVIL) 25 MG tablet    Sig: Take 1 tablet (25 mg total) by mouth at bedtime. Take in addition to 100mg  tablet for total of 125mg  nightly    Dispense:  30 tablet    Refill:  5    No orders of the defined types were placed in this encounter.     CC:  Rema Fendt, NP    I spent 45 minutes of face-to-face and non-face-to-face time with patient.  This included previsit chart review, lab review,  study review, order entry, electronic health record documentation, patient education and discussion regarding above diagnoses and treatment plan and answered all the questions to patient's satisfaction  Ihor Austin, [facility]  [facility] [address] [place], [place] [address]  Phone [phone] Fax [phone] Note: This document was prepared with digital dictation and possible smart phrase technology. Any transcriptional errors that result from this process are unintentional.

## 2023-01-26 ENCOUNTER — Encounter: Payer: Self-pay | Admitting: Adult Health

## 2023-01-26 ENCOUNTER — Ambulatory Visit: Payer: Medicaid Other | Admitting: Adult Health

## 2023-01-26 VITALS — BP 139/82 | HR 61 | Ht 71.0 in | Wt 161.0 lb

## 2023-01-26 DIAGNOSIS — R531 Weakness: Secondary | ICD-10-CM | POA: Diagnosis not present

## 2023-01-26 DIAGNOSIS — G43109 Migraine with aura, not intractable, without status migrainosus: Secondary | ICD-10-CM | POA: Diagnosis not present

## 2023-01-26 DIAGNOSIS — G5623 Lesion of ulnar nerve, bilateral upper limbs: Secondary | ICD-10-CM | POA: Diagnosis not present

## 2023-01-26 DIAGNOSIS — R200 Anesthesia of skin: Secondary | ICD-10-CM | POA: Diagnosis not present

## 2023-01-26 DIAGNOSIS — M4802 Spinal stenosis, cervical region: Secondary | ICD-10-CM

## 2023-01-26 MED ORDER — AMITRIPTYLINE HCL 100 MG PO TABS: 100.0000 mg | ORAL_TABLET | Freq: Every day | ORAL | 1 refills | Status: DC

## 2023-01-26 MED ORDER — AMITRIPTYLINE HCL 25 MG PO TABS: 25.0000 mg | ORAL_TABLET | Freq: Every day | ORAL | 5 refills | Status: DC

## 2023-01-26 MED ORDER — NURTEC 75 MG PO TBDP: 75.0000 mg | ORAL_TABLET | ORAL | 11 refills | Status: DC | PRN

## 2023-01-26 NOTE — Patient Instructions (Signed)
Would recommend pursuing surgical intervention for persistent/worsening symptoms as this will help relisve the pressure of your nerves - can consider second opinion if needed  Continue amitriptyline but will increase to 125mg  nightly with hopes this further helps with pain and sleep - can further discuss other medications to help with sleep with PCP at follow up visit  Try Nurtec as needed for migraine rescue - only take 1 in a 24 hr period.       Followup in the future with me in 6 months or call earlier if needed       Thank you for coming to see [place] at [facility]. I hope we have been able to provide you high quality care today.  You may receive a patient satisfaction survey over the next few weeks. We would appreciate your feedback and comments so that we may continue to improve ourselves and the health of our patients.

## 2023-02-02 ENCOUNTER — Ambulatory Visit (INDEPENDENT_AMBULATORY_CARE_PROVIDER_SITE_OTHER): Payer: Medicaid Other | Admitting: Family

## 2023-02-02 ENCOUNTER — Encounter: Payer: Self-pay | Admitting: Family

## 2023-02-02 VITALS — BP 135/88 | HR 93 | Temp 98.2°F | Ht 70.0 in | Wt 166.2 lb

## 2023-02-02 DIAGNOSIS — E119 Type 2 diabetes mellitus without complications: Secondary | ICD-10-CM | POA: Diagnosis not present

## 2023-02-02 DIAGNOSIS — Z7984 Long term (current) use of oral hypoglycemic drugs: Secondary | ICD-10-CM | POA: Diagnosis not present

## 2023-02-02 DIAGNOSIS — E785 Hyperlipidemia, unspecified: Secondary | ICD-10-CM | POA: Diagnosis not present

## 2023-02-02 LAB — POCT GLYCOSYLATED HEMOGLOBIN (HGB A1C): HbA1c, POC (controlled diabetic range): 6.4 % (ref 0.0–7.0)

## 2023-02-02 MED ORDER — ATORVASTATIN CALCIUM 40 MG PO TABS: 40.0000 mg | ORAL_TABLET | Freq: Every day | ORAL | 0 refills | Status: DC

## 2023-02-02 MED ORDER — SITAGLIPTIN PHOSPHATE 25 MG PO TABS
25.0000 mg | ORAL_TABLET | Freq: Every day | ORAL | 0 refills | Status: DC
Start: 2023-02-02 — End: 2023-04-27

## 2023-02-02 MED ORDER — EMPAGLIFLOZIN 25 MG PO TABS
25.0000 mg | ORAL_TABLET | Freq: Every day | ORAL | 0 refills | Status: DC
Start: 2023-02-02 — End: 2023-04-27

## 2023-02-02 MED ORDER — METFORMIN HCL 1000 MG PO TABS: 1000.0000 mg | ORAL_TABLET | Freq: Two times a day (BID) | ORAL | 0 refills | Status: DC

## 2023-02-02 NOTE — Progress Notes (Signed)
APatient wants to speak about his discussion with neuro surgeon and wants your opinion on something.  Patient does not want Flu vaccine.

## 2023-02-02 NOTE — Progress Notes (Signed)
Patient ID: SELMAN PARZIALE, male    DOB: 1966-02-23  MRN: [id number]  CC: Chronic Conditions Management  Subjective: Matthew Jennings is a 57 y.o. male who presents for chronic conditions management.   His concerns today include:  - Doing well on Metformin, Jardiance, and Januvia, no issues/concerns. Reports he is no longer taking Ozempic because caused upset stomach. He denies red flag symptoms associated with diabetes.  - Doing well on Atorvastatin, no issues/concerns.  - Established with Neurology and Neurosurgery for management of chronic neck and shoulder pain. States surgeon wants to complete surgery and he is not ready. Reports he is waiting for a reply from neurologist to see if he is able to get updated imaging.    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 90 tablet 1   amitriptyline (ELAVIL) 25 MG tablet Take 1 tablet (25 mg total) by mouth at bedtime. Take in addition to 100mg  tablet for total of 125mg  nightly 30 tablet 5   Blood Glucose Monitoring Suppl (TRUE METRIX METER) w/Device KIT Use as directed 1 kit 0   clopidogrel (PLAVIX) 75 MG tablet Take 1 tablet (75 mg total) by mouth daily. 30 tablet 11   glucose blood (TRUE METRIX BLOOD GLUCOSE TEST) test strip Use as instructed 100 each 12   Insulin Pen Needle 31G X 8 MM MISC Use as directed 100 each 0    naproxen sodium (ALEVE) 220 MG tablet Take 220 mg by mouth.     Rimegepant Sulfate (NURTEC) 75 MG TBDP Take 1 tablet (75 mg total) by mouth as needed (migraine rescue). 12 tablet 11   Semaglutide, 2 MG/DOSE, 8 MG/3ML SOPN Inject 2 mg as directed once a week. 12 mL 0   sulfamethoxazole-trimethoprim (BACTRIM DS) 800-160 MG tablet Take 1 tablet every 12 hours by oral route for 7 days.     TRUEplus Lancets 28G MISC Use as directed 100 each 4   No current facility-administered medications on file prior to visit.    Allergies  Allergen Reactions   Aspirin Other (See Comments)   Penicillins Rash    Social History   Socioeconomic History   Marital status: Single    Spouse name: Not on file   Number of children: 0   Years of education: Not on file   Highest education level: Associate degree: occupational, Scientist, product/process development, or vocational program  Occupational History    Comment: EMT  Tobacco Use   Smoking status: Never    Passive exposure: Yes   Smokeless tobacco: Never  Substance and Sexual Activity   Alcohol use: Yes    Comment: occ   Drug use: Not Currently   Sexual activity: Not Currently  Other Topics Concern   Not on file  Social History Narrative   Lives alone   Social Determinants of Health   Financial Resource Strain: Not on file  Food Insecurity: Not on file  Transportation Needs: Not on file  Physical Activity: Not on file  Stress: Not on file  Social Connections: Not on file  Intimate Partner Violence: Not on file    Family History  Problem Relation Age of Onset   Cancer Mother    Alzheimer's disease Father     Past Surgical History:  Procedure Laterality Date   KNEE RECONSTRUCTION Left     ROS: Review of Systems Negative except as stated above  PHYSICAL EXAM: BP 135/88   Pulse 93   Temp 98.2 F (36.8 C) (Oral)   Ht 5\' 10"  (1.778 m)   Wt 166 lb 3.2 oz (75.4 kg)   SpO2 98%   BMI 23.85 kg/m   Physical Exam HENT:     Head: Normocephalic and  atraumatic.     Nose: Nose normal.     Mouth/Throat:     Mouth: Mucous membranes are moist.     Pharynx: Oropharynx is clear.  Eyes:     Extraocular Movements: Extraocular movements intact.     Conjunctiva/sclera: Conjunctivae normal.     Pupils: Pupils are equal, round, and reactive to light.  Cardiovascular:     Rate and Rhythm: Normal rate and regular rhythm.     Pulses: Normal pulses.     Heart sounds: Normal heart sounds.  Pulmonary:     Effort: Pulmonary effort is normal.     Breath sounds: Normal breath sounds.  Musculoskeletal:        General: Normal range of motion.     Cervical back: Normal range of motion and neck supple.  Neurological:     General: No focal deficit present.     Mental Status: He is alert and oriented to person, place, and time.  Psychiatric:        Mood and Affect: Mood normal.        Behavior: Behavior normal.     Results for orders placed or performed in visit on 02/02/23  POCT glycosylated hemoglobin (Hb A1C)  Result Value Ref Range   Hemoglobin A1C     HbA1c POC (<> result, manual entry)     HbA1c, POC (prediabetic range)     HbA1c, POC (controlled diabetic range) 6.4 0.0 - 7.0 %    ASSESSMENT AND PLAN: 1. Type 2 diabetes mellitus without complication, without long-term current use of insulin (HCC) - Hemoglobin A1c at goal at 6.4%, goal 7%.  - Continue Metformin, Sitagliptin, and Empagliflozin as prescribed. - Routine screening.  - Discussed the importance of healthy eating habits, low-carbohydrate diet, low-sugar diet, regular aerobic exercise (at least 150 minutes a week as tolerated) and medication compliance to achieve or maintain control of diabetes. - Follow-up with primary provider in 3 months or sooner if needed.  - POCT glycosylated hemoglobin (Hb A1C) - Basic Metabolic Panel - metFORMIN (GLUCOPHAGE) 1000 MG tablet; Take 1 tablet (1,000 mg total) by mouth 2 (two) times daily with a meal.  Dispense: 180 tablet; Refill: 0 -  sitaGLIPtin (JANUVIA) 25 MG tablet; Take 1 tablet (25 mg total) by mouth daily.  Dispense: 90 tablet; Refill: 0 - empagliflozin (JARDIANCE) 25 MG TABS tablet; Take 1 tablet (25 mg total) by mouth daily before breakfast.  Dispense: 90 tablet; Refill: 0  2. Hyperlipidemia, unspecified hyperlipidemia type - Continue Atorvastatin as prescribed.  - Routine screening.  - Follow-up with primary provider as scheduled.  - Lipid panel - atorvastatin (LIPITOR) 40 MG tablet; Take 1 tablet (40 mg total)  by mouth daily.  Dispense: 90 tablet; Refill: 0    Patient was given the opportunity to ask questions.  Patient verbalized understanding of the plan and was able to repeat key elements of the plan. Patient was given clear instructions to go to Emergency Department or return to medical center if symptoms don't improve, worsen, or new problems develop.The patient verbalized understanding.   Orders Placed This Encounter  Procedures   Lipid panel   Basic Metabolic Panel   POCT glycosylated hemoglobin (Hb A1C)     Requested Prescriptions   Signed Prescriptions Disp Refills   atorvastatin (LIPITOR) 40 MG tablet 90 tablet 0    Sig: Take 1 tablet (40 mg total) by mouth daily.   metFORMIN (GLUCOPHAGE) 1000 MG tablet 180 tablet 0    Sig: Take 1 tablet (1,000 mg total) by mouth 2 (two) times daily with a meal.   sitaGLIPtin (JANUVIA) 25 MG tablet 90 tablet 0    Sig: Take 1 tablet (25 mg total) by mouth daily.   empagliflozin (JARDIANCE) 25 MG TABS tablet 90 tablet 0    Sig: Take 1 tablet (25 mg total) by mouth daily before breakfast.    Return in about 3 months (around 05/04/2023) for Follow-Up or next available chronic conditions.  Rema Fendt, NP

## 2023-02-03 LAB — LIPID PANEL
Chol/HDL Ratio: 3.3 ratio (ref 0.0–5.0)
Cholesterol, Total: 174 mg/dL (ref 100–199)
HDL: 53 mg/dL (ref >39)
LDL Chol Calc (NIH): 101 mg/dL — ABNORMAL HIGH (ref 0–99)
Triglycerides: 109 mg/dL (ref 0–149)
VLDL Cholesterol Cal: 20 mg/dL (ref 5–40)

## 2023-02-03 LAB — BASIC METABOLIC PANEL
BUN/Creatinine Ratio: 25 — ABNORMAL HIGH (ref 9–20)
BUN: 21 mg/dL (ref 6–24)
CO2: 19 mmol/L — ABNORMAL LOW (ref 20–29)
Calcium: 10.3 mg/dL — ABNORMAL HIGH (ref 8.7–10.2)
Chloride: 105 mmol/L (ref 96–106)
Creatinine, Ser: 0.83 mg/dL (ref 0.76–1.27)
Glucose: 164 mg/dL — ABNORMAL HIGH (ref 70–99)
Potassium: 4.6 mmol/L (ref 3.5–5.2)
Sodium: 143 mmol/L (ref 134–144)
eGFR: 102 mL/min/{1.73_m2} (ref >59)

## 2023-02-13 ENCOUNTER — Telehealth: Payer: Self-pay

## 2023-02-13 NOTE — Telephone Encounter (Signed)
*  [facility]  Pharmacy Patient Advocate Encounter   Received notification from CoverMyMeds that prior authorization for Nurtec 75MG  dispersible tablets  is required/requested.   Insurance verification completed.   The patient is insured through [facility] .   Per test claim: PA required; PA submitted to [facility] via CoverMyMeds Key/confirmation #/EOC [id number] Status is pending

## 2023-02-20 NOTE — Telephone Encounter (Signed)
Pharmacy Patient Advocate Encounter  Received notification from [facility] MEDICAID that Prior Authorization for Nurtec 75MG  dispersible tablets has been APPROVED from 02/13/2023 to 02/13/2024   PA #/Case ID/Reference #: PA Case ID #: [id number]

## 2023-03-03 ENCOUNTER — Telehealth: Payer: Self-pay | Admitting: Family

## 2023-03-03 ENCOUNTER — Telehealth: Payer: Self-pay | Admitting: Adult Health

## 2023-03-03 NOTE — Telephone Encounter (Signed)
Noted  

## 2023-03-03 NOTE — Telephone Encounter (Signed)
Pt called wanting to know if provider will be putting in for more updated Scans for the Neurosurgeon to go by.Pt stated that if he does not pick up to please leave a detailed message for him.

## 2023-03-03 NOTE — Telephone Encounter (Signed)
Pt called back, the message from [place], NP was relayed to him.  No call back requested.  This is FYI to POD

## 2023-03-03 NOTE — Telephone Encounter (Signed)
Patient dropped off document Surgical Clearance, to be filled out by provider. Patient requested to send it back via Fax within ASAP. Document is located in providers tray at front office.Please advise at [phone]

## 2023-03-04 ENCOUNTER — Telehealth: Payer: Self-pay | Admitting: Family

## 2023-03-04 NOTE — Telephone Encounter (Signed)
Called pt and left vm to call office back to schedule appt for preoperative clearance form

## 2023-03-05 ENCOUNTER — Telehealth: Payer: Self-pay | Admitting: Family

## 2023-03-05 NOTE — Telephone Encounter (Signed)
Attempted 2nd call with pt and left vm to call office back to schedule appt for preoperative clearance form

## 2023-03-06 NOTE — Telephone Encounter (Signed)
Patient is on schedule for 03/23/2023 @ 10:40am to have surgical clearance form completed. Paper work is at [employer]

## 2023-03-23 ENCOUNTER — Encounter: Payer: Self-pay | Admitting: Family Medicine

## 2023-03-23 ENCOUNTER — Ambulatory Visit (INDEPENDENT_AMBULATORY_CARE_PROVIDER_SITE_OTHER): Payer: Medicaid Other | Admitting: Family Medicine

## 2023-03-23 VITALS — BP 138/83 | HR 79 | Temp 98.5°F | Resp 18 | Ht 70.0 in | Wt 164.8 lb

## 2023-03-23 DIAGNOSIS — Z01818 Encounter for other preprocedural examination: Secondary | ICD-10-CM

## 2023-03-25 ENCOUNTER — Encounter: Payer: Self-pay | Admitting: Family Medicine

## 2023-03-25 NOTE — Progress Notes (Signed)
Established Patient Office Visit  Subjective    Patient ID: Matthew Jennings, male    DOB: 10/23/1965  Age: 57 y.o. MRN: [id number]  CC:  Chief Complaint  Patient presents with   clearance for surgery     HPI [name] presents for medical presurgical clearance for surgical stenosis surgery. Patient denies acute complaints.   Outpatient Encounter Medications as of 03/23/2023  Medication Sig   amitriptyline (ELAVIL) 100 MG tablet Take 1 tablet (100 mg total) by mouth at bedtime.   amitriptyline (ELAVIL) 25 MG tablet Take 1 tablet (25 mg total) by mouth at bedtime. Take in addition to 100mg  tablet for total of 125mg  nightly   atorvastatin (LIPITOR) 40 MG tablet Take 1 tablet (40 mg total) by mouth daily.   Blood Glucose Monitoring Suppl (TRUE METRIX METER) w/Device KIT Use as directed   clopidogrel (PLAVIX) 75 MG tablet Take 1 tablet (75 mg total) by mouth daily.   empagliflozin (JARDIANCE) 25 MG TABS tablet Take 1 tablet (25 mg total) by mouth daily before breakfast.   glucose blood (TRUE METRIX BLOOD GLUCOSE TEST) test strip Use as instructed   Insulin Pen Needle 31G X 8 MM MISC Use as directed   metFORMIN (GLUCOPHAGE) 1000 MG tablet Take 1 tablet (1,000 mg total) by mouth 2 (two) times daily with a meal.   naproxen sodium (ALEVE) 220 MG tablet Take 220 mg by mouth.   Rimegepant Sulfate (NURTEC) 75 MG TBDP Take 1 tablet (75 mg total) by mouth as needed (migraine rescue).   Semaglutide, 2 MG/DOSE, 8 MG/3ML SOPN Inject 2 mg as directed once a week.   sitaGLIPtin (JANUVIA) 25 MG tablet Take 1 tablet (25 mg total) by mouth daily.   sulfamethoxazole-trimethoprim (BACTRIM DS) 800-160 MG tablet Take 1 tablet every 12 hours by oral route for 7 days.   TRUEplus Lancets 28G MISC Use as directed   No facility-administered encounter medications on file as of 03/23/2023.    Past Medical History:  Diagnosis Date   Bell's palsy    left   DM (diabetes mellitus) (HCC)    Facial  paralysis/Bells palsy    Hx of migraines     Past Surgical History:  Procedure Laterality Date   KNEE RECONSTRUCTION Left     Family History  Problem Relation Age of Onset   Cancer Mother    Alzheimer's disease Father     Social History   Socioeconomic History   Marital status: Single    Spouse name: Not on file   Number of children: 0   Years of education: Not on file   Highest education level: Associate degree: occupational, Scientist, product/process development, or vocational program  Occupational History    Comment: EMT  Tobacco Use   Smoking status: Never    Passive exposure: Yes   Smokeless tobacco: Never  Substance and Sexual Activity   Alcohol use: Yes    Comment: occ   Drug use: Not Currently   Sexual activity: Not Currently  Other Topics Concern   Not on file  Social History Narrative   Lives alone   Social Determinants of Health   Financial Resource Strain: High Risk (03/23/2023)   Overall Financial Resource Strain (CARDIA)    Difficulty of Paying Living Expenses: Very hard  Food Insecurity: Food Insecurity Present (03/23/2023)   Hunger Vital Sign    Worried About Running Out of Food in the Last Year: Often true    Ran Out of Food in the Last Year:  Often true  Transportation Needs: No Transportation Needs (03/23/2023)   PRAPARE - Administrator, Civil Service (Medical): No    Lack of Transportation (Non-Medical): No  Physical Activity: Sufficiently Active (03/23/2023)   Exercise Vital Sign    Days of Exercise per Week: 5 days    Minutes of Exercise per Session: 30 min  Stress: Stress Concern Present (03/23/2023)   [employer] of Occupational Health - Occupational Stress Questionnaire    Feeling of Stress : Very much  Social Connections: Socially Isolated (03/23/2023)   Social Connection and Isolation Panel [NHANES]    Frequency of Communication with Friends and Family: More than three times a week    Frequency of Social Gatherings with Friends and Family:  Three times a week    Attends Religious Services: Never    Active Member of Clubs or Organizations: No    Attends Banker Meetings: Never    Marital Status: Never married  Intimate Partner Violence: Not At Risk (03/23/2023)   Humiliation, Afraid, Rape, and Kick questionnaire    Fear of Current or Ex-Partner: No    Emotionally Abused: No    Physically Abused: No    Sexually Abused: No    Review of Systems  All other systems reviewed and are negative.       Objective    BP 138/83 (BP Location: Right Arm, Patient Position: Sitting, Cuff Size: Normal)   Pulse 79   Temp 98.5 F (36.9 C) (Oral)   Resp 18   Ht 5\' 10"  (1.778 m)   Wt 164 lb 12.8 oz (74.8 kg)   SpO2 94%   BMI 23.65 kg/m   Physical Exam Vitals and nursing note reviewed.  Constitutional:      General: He is not in acute distress. Cardiovascular:     Rate and Rhythm: Normal rate and regular rhythm.  Pulmonary:     Effort: Pulmonary effort is normal.     Breath sounds: Normal breath sounds.  Abdominal:     Palpations: Abdomen is soft.     Tenderness: There is no abdominal tenderness.  Musculoskeletal:     Cervical back: Neck supple. Tenderness present.  Neurological:     General: No focal deficit present.     Mental Status: He is alert and oriented to person, place, and time.         Assessment & Plan:   Preoperative clearance   Patient appears medically stable to undergo the stated procedure.   No follow-ups on file.   Matthew Raymond, MD

## 2023-04-06 ENCOUNTER — Other Ambulatory Visit: Payer: Self-pay | Admitting: Neurosurgery

## 2023-04-23 ENCOUNTER — Other Ambulatory Visit: Payer: Self-pay | Admitting: Neurosurgery

## 2023-04-27 ENCOUNTER — Telehealth: Payer: Self-pay | Admitting: *Deleted

## 2023-04-27 ENCOUNTER — Ambulatory Visit (INDEPENDENT_AMBULATORY_CARE_PROVIDER_SITE_OTHER): Payer: Medicaid Other | Admitting: Family

## 2023-04-27 VITALS — BP 145/92 | HR 96 | Temp 98.7°F | Ht 70.0 in | Wt 166.4 lb

## 2023-04-27 DIAGNOSIS — E119 Type 2 diabetes mellitus without complications: Secondary | ICD-10-CM | POA: Diagnosis not present

## 2023-04-27 DIAGNOSIS — R030 Elevated blood-pressure reading, without diagnosis of hypertension: Secondary | ICD-10-CM | POA: Diagnosis not present

## 2023-04-27 DIAGNOSIS — E785 Hyperlipidemia, unspecified: Secondary | ICD-10-CM

## 2023-04-27 DIAGNOSIS — Z7984 Long term (current) use of oral hypoglycemic drugs: Secondary | ICD-10-CM

## 2023-04-27 LAB — POCT GLYCOSYLATED HEMOGLOBIN (HGB A1C): HbA1c, POC (controlled diabetic range): 8.7 % — AB (ref 0.0–7.0)

## 2023-04-27 MED ORDER — ATORVASTATIN CALCIUM 40 MG PO TABS: 40.0000 mg | ORAL_TABLET | Freq: Every day | ORAL | 0 refills | Status: DC

## 2023-04-27 MED ORDER — SITAGLIPTIN PHOSPHATE 50 MG PO TABS: 50.0000 mg | ORAL_TABLET | Freq: Every day | ORAL | 0 refills | Status: DC

## 2023-04-27 MED ORDER — EMPAGLIFLOZIN 25 MG PO TABS: 25.0000 mg | ORAL_TABLET | Freq: Every day | ORAL | 0 refills | Status: DC

## 2023-04-27 NOTE — Telephone Encounter (Signed)
Yes

## 2023-04-27 NOTE — Progress Notes (Signed)
Patient states no concerns to discuss

## 2023-04-27 NOTE — Telephone Encounter (Signed)
Pt given lab results per notes of A. Zonia Kief, NP from 04/27/23 on 04/27/23. Pt verbalized understanding to increase sitagliptin to 50 mg . Previous dose was 25 mg . Recommended patient can take 2 tablets of 25 mg of januvia daily until Rx gone and then when new Rx picked up for 50 mg tablets to take 1 tablet 50 mg daily as directed. Patient reports he just had 25 mg dose filled last week. Patient aware to  continue taking jardiance and to see PCP in 4 weeks if needed.

## 2023-04-27 NOTE — Progress Notes (Signed)
Patient ID: Matthew Jennings, male    DOB: 06/14/1965  MRN: [id number]  CC: Chronic Conditions Follow-Up  Subjective: Matthew Jennings is a 57 y.o. male who presents for chronic conditions follow-up.   His concerns today include: - Doing well on Januvia and Jardiance, no issues/concerns. Intolerant to Metformin causes headaches. Intolerant to Ozempic causes bloating. Home blood sugars in the 200's. Up to date on diabetic eye exam. Denies red flag symptoms associated with diabetes.  - Doing well on Atorvastatin, no issues/concerns.   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 90 tablet 1   amitriptyline (ELAVIL) 25 MG tablet Take 1 tablet (25 mg total) by mouth at bedtime. Take in addition to 100mg  tablet for total of 125mg  nightly 30 tablet 5   Blood Glucose Monitoring Suppl (TRUE METRIX METER) w/Device KIT Use as directed 1 kit 0   glucose blood (TRUE METRIX BLOOD GLUCOSE TEST) test strip Use as instructed 100 each 12   Insulin Pen Needle 31G X 8 MM MISC Use as directed 100 each 0   Rimegepant Sulfate (NURTEC) 75 MG TBDP Take 1 tablet (75 mg total) by mouth as needed (migraine rescue). 12 tablet 11   TRUEplus Lancets 28G MISC Use as directed 100 each 4   clopidogrel (PLAVIX) 75 MG tablet Take 1 tablet (75 mg total) by mouth daily. (Patient not taking: Reported on  04/27/2023) 30 tablet 11   metFORMIN (GLUCOPHAGE) 1000 MG tablet Take 1 tablet (1,000 mg total) by mouth 2 (two) times daily with a meal. (Patient not taking: Reported on 04/27/2023) 180 tablet 0   naproxen sodium (ALEVE) 220 MG tablet Take 220 mg by mouth. (Patient not taking: Reported on 04/27/2023)     Semaglutide, 2 MG/DOSE, 8 MG/3ML SOPN Inject 2 mg as directed once a week. (Patient not taking: Reported on 04/27/2023) 12 mL 0   sulfamethoxazole-trimethoprim (BACTRIM DS) 800-160 MG tablet Take 1 tablet every 12 hours by oral route for 7 days. (Patient not taking: Reported on 04/27/2023)     No current facility-administered medications on file prior to visit.    Allergies  Allergen Reactions   Aspirin Other (See Comments)   Penicillins Rash    Social History   Socioeconomic History   Marital status: Single    Spouse name: Not on file   Number of children: 0   Years of education: Not on file   Highest education level: Associate degree: occupational, Scientist, product/process development, or vocational program  Occupational History    Comment: EMT  Tobacco Use   Smoking status: Never    Passive exposure: Yes   Smokeless tobacco: Never  Substance and Sexual Activity   Alcohol use: Yes    Comment: occ   Drug use: Not Currently   Sexual activity: Not Currently  Other Topics Concern   Not on file  Social History Narrative  Lives alone   Social Drivers of Health   Financial Resource Strain: High Risk (03/23/2023)   Overall Financial Resource Strain (CARDIA)    Difficulty of Paying Living Expenses: Very hard  Food Insecurity: Food Insecurity Present (03/23/2023)   Hunger Vital Sign    Worried About Running Out of Food in the Last Year: Often true    Ran Out of Food in the Last Year: Often true  Transportation Needs: No Transportation Needs (03/23/2023)   PRAPARE - Administrator, Civil Service (Medical): No    Lack of Transportation (Non-Medical): No  Physical Activity: Sufficiently  Active (03/23/2023)   Exercise Vital Sign    Days of Exercise per Week: 5 days    Minutes of Exercise per Session: 30 min  Stress: Stress Concern Present (03/23/2023)   [employer] of Occupational Health - Occupational Stress Questionnaire    Feeling of Stress : Very much  Social Connections: Socially Isolated (03/23/2023)   Social Connection and Isolation Panel [NHANES]    Frequency of Communication with Friends and Family: More than three times a week    Frequency of Social Gatherings with Friends and Family: Three times a week    Attends Religious Services: Never    Active Member of Clubs or Organizations: No    Attends Banker Meetings: Never    Marital Status: Never married  Intimate Partner Violence: Not At Risk (03/23/2023)   Humiliation, Afraid, Rape, and Kick questionnaire    Fear of Current or Ex-Partner: No    Emotionally Abused: No    Physically Abused: No    Sexually Abused: No    Family History  Problem Relation Age of Onset   Cancer Mother    Alzheimer's disease Father     Past Surgical History:  Procedure Laterality Date   KNEE RECONSTRUCTION Left     ROS: Review of Systems Negative except as stated above  PHYSICAL EXAM: BP (!) 145/92   Pulse 96   Temp 98.7 F (37.1 C) (Oral)   Ht 5\' 10"  (1.778 m)   Wt 166 lb 6.4 oz (75.5 kg)   SpO2 94%   BMI 23.88 kg/m   Physical Exam HENT:     Head: Normocephalic and atraumatic.     Nose: Nose normal.     Mouth/Throat:     Mouth: Mucous membranes are moist.     Pharynx: Oropharynx is clear.  Eyes:     Extraocular Movements: Extraocular movements intact.     Conjunctiva/sclera: Conjunctivae normal.     Pupils: Pupils are equal, round, and reactive to light.  Cardiovascular:     Rate and Rhythm: Normal rate and regular rhythm.     Pulses: Normal pulses.     Heart sounds: Normal heart sounds.  Pulmonary:     Effort: Pulmonary effort is normal.     Breath sounds: Normal breath sounds.   Musculoskeletal:        General: Normal range of motion.     Cervical back: Normal range of motion and neck supple.  Neurological:     General: No focal deficit present.     Mental Status: He is alert and oriented to person, place, and time.  Psychiatric:        Mood and Affect: Mood normal.        Behavior: Behavior normal.     ASSESSMENT AND PLAN: 1. Type 2 diabetes mellitus without complication, unspecified whether long term insulin use (HCC) (Primary) - Hemoglobin A1c  not at goal at 8.7%, goal 7%.  - Patient intolerant to Metformin and Semaglutide. - Increase Sitagliptin from 25 mg to 50 mg. - Continue Empagliflozin as prescribed.  - Discussed the importance of healthy eating habits, low-carbohydrate diet, low-sugar diet, regular aerobic exercise (at least 150 minutes a week as tolerated) and medication compliance to achieve or maintain control of diabetes. - Follow-up with primary provider in 4 weeks or sooner if needed. - POCT glycosylated hemoglobin (Hb A1C) - sitaGLIPtin (JANUVIA) 50 MG tablet; Take 1 tablet (50 mg total) by mouth daily.  Dispense: 90 tablet; Refill: 0 - empagliflozin (JARDIANCE) 25 MG TABS tablet; Take 1 tablet (25 mg total) by mouth daily before breakfast.  Dispense: 90 tablet; Refill: 0  2. Hyperlipidemia, unspecified hyperlipidemia type - Continue Atorvastatin as prescribed. Counseled on medication adherence/adverse effects.  - Follow-up with primary provider as scheduled.  - atorvastatin (LIPITOR) 40 MG tablet; Take 1 tablet (40 mg total) by mouth daily.  Dispense: 90 tablet; Refill: 0  3. Encounter for diabetic foot exam [facility]) - Referral to Podiatry for evaluation/management. - Ambulatory referral to Podiatry  4. Elevated blood pressure reading - Blood pressure not at goal during today's visit. Patient asymptomatic without chest pressure, chest pain, palpitations, shortness of breath, worst headache of life, and any additional red flag symptoms. -  Follow-up with primary provider in 4 weeks or sooner if needed.     Patient was given the opportunity to ask questions.  Patient verbalized understanding of the plan and was able to repeat key elements of the plan. Patient was given clear instructions to go to Emergency Department or return to medical center if symptoms don't improve, worsen, or new problems develop.The patient verbalized understanding.   Orders Placed This Encounter  Procedures   Ambulatory referral to Podiatry   POCT glycosylated hemoglobin (Hb A1C)     Requested Prescriptions   Signed Prescriptions Disp Refills   sitaGLIPtin (JANUVIA) 50 MG tablet 90 tablet 0    Sig: Take 1 tablet (50 mg total) by mouth daily.   empagliflozin (JARDIANCE) 25 MG TABS tablet 90 tablet 0    Sig: Take 1 tablet (25 mg total) by mouth daily before breakfast.   atorvastatin (LIPITOR) 40 MG tablet 90 tablet 0    Sig: Take 1 tablet (40 mg total) by mouth daily.    Return in about 4 weeks (around 05/25/2023) for Follow-Up or next available chronic conditions.  Rema Fendt, NP

## 2023-05-22 ENCOUNTER — Encounter: Payer: No Typology Code available for payment source | Admitting: Family

## 2023-05-22 NOTE — Progress Notes (Signed)
 Erroneous encounter-disregard

## 2023-05-25 NOTE — Pre-Procedure Instructions (Signed)
 Surgical Instructions   Your procedure is scheduled on June 02, 2023. Report to [facility] Main Entrance A at 5:30 A.M., then check in with the Admitting office. Any questions or running late day of surgery: call [phone]  Questions prior to your surgery date: call [phone], Monday-Friday, 8am-4pm. If you experience any cold or flu symptoms such as cough, fever, chills, shortness of breath, etc. between now and your scheduled surgery, please notify us  at the above number.     Remember:  Do not eat or drink after midnight the night before your surgery    Take these medicines the morning of surgery with A SIP OF WATER: atorvastatin  (LIPITOR)    May take these medicines IF NEEDED: amitriptyline  (ELAVIL )    Follow your surgeon's instructions on when to stop clopidogrel  (PLAVIX ).  If no instructions were given by your surgeon then you will need to call the office to get those instructions.     One week prior to surgery, STOP taking any Aspirin (unless otherwise instructed by your surgeon) Aleve, Naproxen, Ibuprofen, Motrin, Advil, Goody's, BC's, all herbal medications, fish oil, and non-prescription vitamins.   WHAT DO I DO ABOUT MY DIABETES MEDICATION?   Do not take sitaGLIPtin  (JANUVIA ) the morning of surgery.  STOP taking your empagliflozin  (JARDIANCE ) three days prior to surgery. Your last dose will be January 17th.     STOP taking your Semaglutide  one week prior to surgery. DO NOT take any doses after January 13th.   HOW TO MANAGE YOUR DIABETES BEFORE AND AFTER SURGERY  Why is it important to control my blood sugar before and after surgery? Improving blood sugar levels before and after surgery helps healing and can limit problems. A way of improving blood sugar control is eating a healthy diet by:  Eating less sugar and carbohydrates  Increasing activity/exercise  Talking with your doctor about reaching your blood sugar goals High blood sugars (greater  than 180 mg/dL) can raise your risk of infections and slow your recovery, so you will need to focus on controlling your diabetes during the weeks before surgery. Make sure that the doctor who takes care of your diabetes knows about your planned surgery including the date and location.  How do I manage my blood sugar before surgery? Check your blood sugar at least 4 times a day, starting 2 days before surgery, to make sure that the level is not too high or low.  Check your blood sugar the morning of your surgery when you wake up and every 2 hours until you get to the Short Stay unit.  If your blood sugar is less than 70 mg/dL, you will need to treat for low blood sugar: Do not take insulin . Treat a low blood sugar (less than 70 mg/dL) with  cup of clear juice (cranberry or apple), 4 glucose tablets, OR glucose gel. Recheck blood sugar in 15 minutes after treatment (to make sure it is greater than 70 mg/dL). If your blood sugar is not greater than 70 mg/dL on recheck, call [phone] for further instructions. Report your blood sugar to the short stay nurse when you get to Short Stay.  If you are admitted to the hospital after surgery: Your blood sugar will be checked by the staff and you will probably be given insulin  after surgery (instead of oral diabetes medicines) to make sure you have good blood sugar levels. The goal for blood sugar control after surgery is 80-180 mg/dL.  Do NOT Smoke (Tobacco/Vaping) for 24 hours prior to your procedure.  If you use a CPAP at night, you may bring your mask/headgear for your overnight stay.   You will be asked to remove any contacts, glasses, piercing's, hearing aid's, dentures/partials prior to surgery. Please bring cases for these items if needed.    Patients discharged the day of surgery will not be allowed to drive home, and someone needs to stay with them for 24 hours.  SURGICAL WAITING ROOM VISITATION Patients may have no  more than 2 support people in the waiting area - these visitors may rotate.   Pre-op nurse will coordinate an appropriate time for 1 ADULT support person, who may not rotate, to accompany patient in pre-op.  Children under the age of 51 must have an adult with them who is not the patient and must remain in the main waiting area with an adult.  If the patient needs to stay at the hospital during part of their recovery, the visitor guidelines for inpatient rooms apply.  Please refer to the [facility] website for the visitor guidelines for any additional information.   If you received a COVID test during your pre-op visit  it is requested that you wear a mask when out in public, stay away from anyone that may not be feeling well and notify your surgeon if you develop symptoms. If you have been in contact with anyone that has tested positive in the last 10 days please notify you surgeon.      Pre-operative 5 CHG Bathing Instructions   You can play a key role in reducing the risk of infection after surgery. Your skin needs to be as free of germs as possible. You can reduce the number of germs on your skin by washing with CHG (chlorhexidine  gluconate) soap before surgery. CHG is an antiseptic soap that kills germs and continues to kill germs even after washing.   DO NOT use if you have an allergy to chlorhexidine /CHG or antibacterial soaps. If your skin becomes reddened or irritated, stop using the CHG and notify one of our RNs at [phone].   Please shower with the CHG soap starting 4 days before surgery using the following schedule:     Please keep in mind the following:  DO NOT shave, including legs and underarms, starting the day of your first shower.   You may shave your face at any point before/day of surgery.  Place clean sheets on your bed the day you start using CHG soap. Use a clean washcloth (not used since being washed) for each shower. DO NOT sleep with pets once you start  using the CHG.   CHG Shower Instructions:  Wash your face and private area with normal soap. If you choose to wash your hair, wash first with your normal shampoo.  After you use shampoo/soap, rinse your hair and body thoroughly to remove shampoo/soap residue.  Turn the water OFF and apply about 3 tablespoons (45 ml) of CHG soap to a CLEAN washcloth.  Apply CHG soap ONLY FROM YOUR NECK DOWN TO YOUR TOES (washing for 3-5 minutes)  DO NOT use CHG soap on face, private areas, open wounds, or sores.  Pay special attention to the area where your surgery is being performed.  If you are having back surgery, having someone wash your back for you may be helpful. Wait 2 minutes after CHG soap is applied, then you may rinse off the CHG soap.  Pat dry with a  clean towel  Put on clean clothes/pajamas   If you choose to wear lotion, please use ONLY the CHG-compatible lotions that are listed below.  Additional instructions for the day of surgery: DO NOT APPLY any lotions, deodorants, cologne, or perfumes.   Do not bring valuables to the hospital. [facility] is not responsible for any belongings/valuables. Do not wear nail polish, gel polish, artificial nails, or any other type of covering on natural nails (fingers and toes) Do not wear jewelry or makeup Put on clean/comfortable clothes.  Please brush your teeth.  Ask your nurse before applying any prescription medications to the skin.     CHG Compatible Lotions   Aveeno Moisturizing lotion  Cetaphil Moisturizing Cream  Cetaphil Moisturizing Lotion  Clairol Herbal Essence Moisturizing Lotion, Dry Skin  Clairol Herbal Essence Moisturizing Lotion, Extra Dry Skin  Clairol Herbal Essence Moisturizing Lotion, Normal Skin  Curel Age Defying Therapeutic Moisturizing Lotion with Alpha Hydroxy  Curel Extreme Care Body Lotion  Curel Soothing Hands Moisturizing Hand Lotion  Curel Therapeutic Moisturizing Cream, Fragrance-Free  Curel Therapeutic  Moisturizing Lotion, Fragrance-Free  Curel Therapeutic Moisturizing Lotion, Original Formula  Eucerin Daily Replenishing Lotion  Eucerin Dry Skin Therapy Plus Alpha Hydroxy Crme  Eucerin Dry Skin Therapy Plus Alpha Hydroxy Lotion  Eucerin Original Crme  Eucerin Original Lotion  Eucerin Plus Crme Eucerin Plus Lotion  Eucerin TriLipid Replenishing Lotion  Keri Anti-Bacterial Hand Lotion  Keri Deep Conditioning Original Lotion Dry Skin Formula Softly Scented  Keri Deep Conditioning Original Lotion, Fragrance Free Sensitive Skin Formula  Keri Lotion Fast Absorbing Fragrance Free Sensitive Skin Formula  Keri Lotion Fast Absorbing Softly Scented Dry Skin Formula  Keri Original Lotion  Keri Skin Renewal Lotion Keri Silky Smooth Lotion  Keri Silky Smooth Sensitive Skin Lotion  Nivea Body Creamy Conditioning Oil  Nivea Body Extra Enriched Lotion  Nivea Body Original Lotion  Nivea Body Sheer Moisturizing Lotion Nivea Crme  Nivea Skin Firming Lotion  NutraDerm 30 Skin Lotion  NutraDerm Skin Lotion  NutraDerm Therapeutic Skin Cream  NutraDerm Therapeutic Skin Lotion  ProShield Protective Hand Cream  Provon moisturizing lotion  Please read over the following fact sheets that you were given.

## 2023-05-26 ENCOUNTER — Encounter (HOSPITAL_COMMUNITY): Payer: Self-pay

## 2023-05-26 ENCOUNTER — Other Ambulatory Visit: Payer: Self-pay

## 2023-05-26 ENCOUNTER — Encounter (HOSPITAL_COMMUNITY)
Admission: RE | Admit: 2023-05-26 | Discharge: 2023-05-26 | Disposition: A | Discharge status: Home / Self Care | Payer: Medicaid Other | Source: Ambulatory Visit | Attending: Neurosurgery | Admitting: Neurosurgery

## 2023-05-26 VITALS — BP 146/85 | HR 86 | Temp 98.3°F | Resp 18 | Ht 70.0 in | Wt 170.0 lb

## 2023-05-26 DIAGNOSIS — E119 Type 2 diabetes mellitus without complications: Secondary | ICD-10-CM | POA: Diagnosis not present

## 2023-05-26 DIAGNOSIS — Z01818 Encounter for other preprocedural examination: Secondary | ICD-10-CM | POA: Diagnosis present

## 2023-05-26 HISTORY — DX: Unspecified osteoarthritis, unspecified site: M19.90

## 2023-05-26 HISTORY — DX: Headache, unspecified: R51.9

## 2023-05-26 LAB — SURGICAL PCR SCREEN
MRSA, PCR: NEGATIVE
Staphylococcus aureus: POSITIVE — AB

## 2023-05-26 LAB — CBC
HCT: 44 % (ref 39.0–52.0)
Hemoglobin: 14.7 g/dL (ref 13.0–17.0)
MCH: 28.1 pg (ref 26.0–34.0)
MCHC: 33.4 g/dL (ref 30.0–36.0)
MCV: 84 fL (ref 80.0–100.0)
Platelets: 201 10*3/uL (ref 150–400)
RBC: 5.24 MIL/uL (ref 4.22–5.81)
RDW: 12.9 % (ref 11.5–15.5)
WBC: 8.9 10*3/uL (ref 4.0–10.5)
nRBC: 0 % (ref 0.0–0.2)

## 2023-05-26 LAB — BASIC METABOLIC PANEL
Anion gap: 9 (ref 5–15)
BUN: 13 mg/dL (ref 6–20)
CO2: 25 mmol/L (ref 22–32)
Calcium: 9.6 mg/dL (ref 8.9–10.3)
Chloride: 106 mmol/L (ref 98–111)
Creatinine, Ser: 1.12 mg/dL (ref 0.61–1.24)
GFR, Estimated: >60 mL/min (ref >60)
Glucose, Bld: 167 mg/dL — ABNORMAL HIGH (ref 70–99)
Potassium: 3.8 mmol/L (ref 3.5–5.1)
Sodium: 140 mmol/L (ref 135–145)

## 2023-05-26 LAB — TYPE AND SCREEN
ABO/RH(D): O NEG
Antibody Screen: NEGATIVE

## 2023-05-26 LAB — GLUCOSE, CAPILLARY: Glucose-Capillary: 161 mg/dL — ABNORMAL HIGH (ref 70–99)

## 2023-05-26 NOTE — Progress Notes (Addendum)
 PCP - Greig Drones, NP Cardiologist - Denies  PPM/ICD - Denies Device Orders - n/a Rep Notified - n/a  Chest x-ray - n/a EKG - 05/26/2023 Stress Test - Denies ECHO - Denies Cardiac Cath - Denies  Sleep Study - Denies CPAP - n/a  Pt is DM2. He checks his blood sugar 2x/day. Normal fasting range is 150-220. CBG at pre-op 161. Pt had nothing to eat or drink today, but did take his DM medications. Last A1c 8.7 on 04/27/23.  Last dose of GLP1 agonist- Pt states he is no longer taking Semaglutide  as of a few months ago due to unwanted side effects GLP1 instructions: Pt instructed not to take any injectable medication prior to surgery  Blood Thinner Instructions: Pt states he is no longer on Plavix  as of a few months ago. Pt is unsure of why he was placed on it in the first place Aspirin Instructions: n/a  NPO after midnight  COVID TEST- n/a   Anesthesia review: Yes. Medical clearance. EKG review  Patient denies shortness of breath, fever, cough and chest pain at PAT appointment. Pt endorses runny nose and scratchy throat about a week and a half ago. He has been symptom free for one week and (as long as no new symptoms occur) symptom free for 2 weeks by DOS.   All instructions explained to the patient, with a verbal understanding of the material. Patient agrees to go over the instructions while at home for a better understanding. Patient also instructed to self quarantine after being tested for COVID-19. The opportunity to ask questions was provided.

## 2023-05-26 NOTE — Progress Notes (Signed)
 During pre-surgical appointment, pt voiced concern about proceeding with the ulnar decompression portion of surgery. He stated that he wanted to proceed with ACDF but would like additionally testing on bilateral arms prior to proceeding with ulnar decompression portion. Angie, OR scheduler with Dr. Debby office made aware. Pt did not sign consent at PAT due to above concern.

## 2023-05-27 NOTE — Progress Notes (Signed)
 Anesthesia Chart Review:  Case: [id number] Date/Time: 06/02/23 0715   Procedures:      ACDF R54,R43,R32     ULNAR NERVE DECOMPRESSION/TRANSPOSITION (Left)   Anesthesia type: General   Pre-op diagnosis: CERVICAL STENOSIS OF SPINE   Location: [facility] OR ROOM 20 / [facility] OR   Surgeons: Debby Dorn MATSU, MD       DISCUSSION: Patient is a 58 year old male scheduled for the above procedure; however he desired to discuss ulnar nerve decompression further with Dr. Debby prior to proceeding, as he is unsure yet if he wants to have this done versus wait for additional testing.  He opted to wait and sign his surgical consent until after having this conversation. consent following this.  History includes never smoker (passive smoke exposure), DM2, CVA (2020), Bells' palsy (left), migraines, arthritis, ventral hernia repair.   He was previously on Plavix , likely due to CVA history He says he is not longer on this.   He had preoperative medical evaluation by Tanda Bleacher, MD on 03/23/23. She wrote, Patient appears medically stable to undergo the stated procedure. Last evaluation was on 04/27/23 with Lorren Greig PARAS, NP. Januvia  increased due to A1c 8.7%.    A1c 8.7% on 04/27/23. Januvia  increased to 50 mg. He is no longer on semaglutide  due to side effects.  He was also intolerant to metformin . He was resistant to starting insulin . He is on Januvia  and Jardiance . He was instructed to hold Jardiance  3 days prior to surgery. Angie at Dr. Debby' office notified of A1c results.  Anesthesia team to evaluate on the day of surgery.   VS: BP (!) 146/85   Pulse 86   Temp 36.8 C   Resp 18   Ht 5' 10 (1.778 m)   Wt 77.1 kg   SpO2 95%   BMI 24.39 kg/m   PROVIDERS: Lorren Greig PARAS, NP is PCP    LABS: Preoperative labs noted. Glucose 167. A1c on 04/27/23 was 8.7%.   (all labs ordered are listed, but only abnormal results are displayed)  Labs Reviewed  SURGICAL PCR SCREEN - Abnormal; Notable for the  following components:      Result Value   Staphylococcus aureus POSITIVE (*)    All other components within normal limits  GLUCOSE, CAPILLARY - Abnormal; Notable for the following components:   Glucose-Capillary 161 (*)    All other components within normal limits  BASIC METABOLIC PANEL - Abnormal; Notable for the following components:   Glucose, Bld 167 (*)    All other components within normal limits  CBC  TYPE AND SCREEN    EKG: 05/26/23:  Sinus rhythm with 1st degree A-V block Left anterior fascicular block Nonspecific ST abnormality Abnormal ECG When compared with ECG of 17-May-2020 14:10, axis has shifted left Confirmed by Croitoru, Mihai ([id number]) on 05/26/2023 1:06:47 PM   CV: N/A  Past Medical History:  Diagnosis Date   Arthritis    Bell's palsy    left   DM (diabetes mellitus) (HCC)    Type 2   Facial paralysis/Bells palsy    Headache    Migraines   Hx of migraines    Stroke (HCC) 2020   Pt states it was a Palsy episode but due to increase in symptoms, classified it as mild stroke leaving pt with current spine/nerve/strength problems    Past Surgical History:  Procedure Laterality Date   KNEE RECONSTRUCTION Left    58 years old   VENTRAL HERNIA REPAIR     As  a newborn    MEDICATIONS:  amitriptyline  (ELAVIL ) 100 MG tablet   amitriptyline  (ELAVIL ) 25 MG tablet   atorvastatin  (LIPITOR) 40 MG tablet   Blood Glucose Monitoring Suppl (TRUE METRIX METER) w/Device KIT   clopidogrel  (PLAVIX ) 75 MG tablet   empagliflozin  (JARDIANCE ) 25 MG TABS tablet   glucose blood (TRUE METRIX BLOOD GLUCOSE TEST) test strip   Insulin  Pen Needle 31G X 8 MM MISC   Rimegepant Sulfate (NURTEC) 75 MG TBDP   Semaglutide , 2 MG/DOSE, 8 MG/3ML SOPN   sitaGLIPtin  (JANUVIA ) 25 MG tablet   TRUEplus Lancets 28G MISC   No current facility-administered medications for this encounter.    Isaiah Ruder, PA-C Surgical Short Stay/Anesthesiology [facility] Phone [phone] [facility] Phone  [phone] 05/27/2023 6:57 PM

## 2023-05-27 NOTE — Anesthesia Preprocedure Evaluation (Addendum)
Anesthesia Evaluation  Patient identified by MRN, date of birth, ID band Patient awake    Reviewed: Allergy & Precautions, NPO status , Patient's Chart, lab work & pertinent test results, reviewed documented beta blocker date and time   Airway Mallampati: III  TM Distance: >3 FB Neck ROM: Limited    Dental no notable dental hx.    Pulmonary neg COPD, neg PE   breath sounds clear to auscultation       Cardiovascular (-) hypertension(-) angina (-) CAD and (-) Past MI  Rhythm:Regular Rate:Normal     Neuro/Psych  Headaches, neg Seizures  Neuromuscular disease CVA, Residual Symptoms    GI/Hepatic ,neg GERD  ,,(+) neg Cirrhosis        Endo/Other  diabetes, Poorly Controlled, Type 2    Renal/GU Renal disease     Musculoskeletal  (+) Arthritis ,    Abdominal   Peds  Hematology   Anesthesia Other Findings   Reproductive/Obstetrics                              Anesthesia Physical Anesthesia Plan  ASA: 3  Anesthesia Plan: General   Post-op Pain Management:    Induction: Intravenous  PONV Risk Score and Plan: 2 and Ondansetron and Dexamethasone  Airway Management Planned: Oral ETT and Video Laryngoscope Planned  Additional Equipment: ClearSight  Intra-op Plan:   Post-operative Plan: Extubation in OR  Informed Consent: I have reviewed the patients History and Physical, chart, labs and discussed the procedure including the risks, benefits and alternatives for the proposed anesthesia with the patient or authorized representative who has indicated his/her understanding and acceptance.     Dental advisory given  Plan Discussed with: CRNA  Anesthesia Plan Comments: (PAT note written 05/27/2023 by Shonna Chock, PA-C.  )        Anesthesia Quick Evaluation

## 2023-05-28 ENCOUNTER — Encounter: Payer: Self-pay | Admitting: Podiatry

## 2023-05-28 ENCOUNTER — Ambulatory Visit (INDEPENDENT_AMBULATORY_CARE_PROVIDER_SITE_OTHER): Payer: Medicaid Other | Admitting: Podiatry

## 2023-05-28 DIAGNOSIS — M2041 Other hammer toe(s) (acquired), right foot: Secondary | ICD-10-CM | POA: Diagnosis not present

## 2023-05-28 DIAGNOSIS — M2042 Other hammer toe(s) (acquired), left foot: Secondary | ICD-10-CM

## 2023-05-28 DIAGNOSIS — M216X9 Other acquired deformities of unspecified foot: Secondary | ICD-10-CM

## 2023-05-28 DIAGNOSIS — B351 Tinea unguium: Secondary | ICD-10-CM

## 2023-05-28 DIAGNOSIS — E1142 Type 2 diabetes mellitus with diabetic polyneuropathy: Secondary | ICD-10-CM | POA: Diagnosis not present

## 2023-05-28 NOTE — Progress Notes (Signed)
Chief Complaint  Patient presents with   Diabetes    Diabetic foot exam - last A1c was 8.7, PCP referred, does have some numbness and cold sensations   New Patient (Initial Visit)    HPI: 58 y.o. male presents today for diabetic foot evaluation.  Last A1c 8.7.  He does endorse numbness, burning, pins-and-needles feeling in his toes.  He does state that he has history of neck and back pain numbness upcoming neck surgery for this.  He does state that the numbness and tingling in his feet does affect his sleep and how much he walks.  Past Medical History:  Diagnosis Date   Arthritis    Bell's palsy    left   DM (diabetes mellitus) (HCC)    Type 2   Facial paralysis/Bells palsy    Headache    Migraines   Hx of migraines    Stroke (HCC) 2020   Pt states it was a "Palsy episode but due to increase in symptoms, classified it as mild stroke" leaving pt with current spine/nerve/strength problems    Past Surgical History:  Procedure Laterality Date   KNEE RECONSTRUCTION Left    58 years old   VENTRAL HERNIA REPAIR     As a newborn    Allergies  Allergen Reactions   Penicillins Anaphylaxis   Aspirin Other (See Comments)    Lyme Disease     ROS    Physical Exam: There were no vitals filed for this visit.  General: The patient is alert and oriented x3 in no acute distress.  Dermatology: Skin is warm, dry and supple bilateral lower extremities. Interspaces are clear of maceration and debris.  Some discoloration of the nail plates increased thickness x 10 bilaterally.  They are otherwise well-maintained.  Vascular: Palpable pedal pulses bilaterally. Capillary refill within normal limits.  No appreciable edema.  No erythema or calor.  Neurological: Light touch sensation grossly intact bilateral feet.  Vibratory sensation absent, absent to midfoot right lower extremity.  Protective sensation decreased 5/10 sites bilaterally.  Musculoskeletal Exam: Cavus foot type with  hammertoe contractures bilaterally.  Muscle strength 5/5 in dorsiflexion, paraphasia, inversion, eversion.   Assessment/Plan of Care: 1. Diabetic polyneuropathy associated with type 2 diabetes mellitus (HCC)   2. Acquired hammertoes of both feet   3. Pain due to onychomycosis of toenails of both feet      No orders of the defined types were placed in this encounter.  None  Discussed clinical findings with patient today.  # Diabetic neuropathy with associated hammertoes -associated cavus foot deformity -Patient educated on diabetes. Discussed proper diabetic foot care and discussed risks and complications of disease. Educated patient in depth on reasons to return to the office immediately should he/she discover anything concerning or new on the feet. All questions answered. Discussed proper shoes as well.  - Diabetic shoes were offered to the patient today. He wishes to defer this until after he recovers from upcomming neck surgery. -Patient states that he has tried gabapentin in the past and that it was not helpful for him.  He is currently on amitriptyline.  Other pharmacological options offered to the patient. States that his neurosurgeon does not want him to try other pharmacological options at this point until recovered from his procedure. Potentially interested in Lyrica if he continues to experience symptoms  # Onychomycosis of toenails -Some discoloration, thickening, trophic changes appreciated today however nail plates overall well-maintained at this point in time -  Nail debridement offered to the patient going forward  Follow-up in 3 months for diabetic footcare  Ludwika Rodd L. Marchia Bond, AACFAS [facility]     [address] [place], [place] [address]                Office [phone]  Fax [phone]

## 2023-05-31 ENCOUNTER — Encounter: Payer: Self-pay | Admitting: Podiatry

## 2023-06-02 ENCOUNTER — Other Ambulatory Visit: Payer: Self-pay

## 2023-06-02 ENCOUNTER — Encounter (HOSPITAL_COMMUNITY): Payer: Self-pay | Admitting: *Deleted

## 2023-06-02 ENCOUNTER — Inpatient Hospital Stay (HOSPITAL_COMMUNITY)
Admission: RE | Admit: 2023-06-02 | Discharge: 2023-06-03 | DRG: 473 | Disposition: A | Discharge status: Home / Self Care | Payer: Medicaid Other | Attending: Neurosurgery | Admitting: Neurosurgery

## 2023-06-02 ENCOUNTER — Inpatient Hospital Stay (HOSPITAL_COMMUNITY): Payer: Medicaid Other | Admitting: Vascular Surgery

## 2023-06-02 ENCOUNTER — Inpatient Hospital Stay (HOSPITAL_COMMUNITY): Payer: Medicaid Other | Admitting: Anesthesiology

## 2023-06-02 ENCOUNTER — Encounter (HOSPITAL_COMMUNITY)
Admission: RE | Disposition: A | Discharge status: Home / Self Care | Payer: Self-pay | Source: Home / Self Care | Attending: Neurosurgery

## 2023-06-02 ENCOUNTER — Inpatient Hospital Stay (HOSPITAL_COMMUNITY): Payer: Medicaid Other

## 2023-06-02 DIAGNOSIS — E785 Hyperlipidemia, unspecified: Secondary | ICD-10-CM | POA: Diagnosis present

## 2023-06-02 DIAGNOSIS — E119 Type 2 diabetes mellitus without complications: Secondary | ICD-10-CM | POA: Diagnosis not present

## 2023-06-02 DIAGNOSIS — Z880 Allergy status to penicillin: Secondary | ICD-10-CM | POA: Diagnosis not present

## 2023-06-02 DIAGNOSIS — Z8673 Personal history of transient ischemic attack (TIA), and cerebral infarction without residual deficits: Secondary | ICD-10-CM | POA: Diagnosis not present

## 2023-06-02 DIAGNOSIS — Z79899 Other long term (current) drug therapy: Secondary | ICD-10-CM

## 2023-06-02 DIAGNOSIS — M4802 Spinal stenosis, cervical region: Secondary | ICD-10-CM

## 2023-06-02 DIAGNOSIS — G5622 Lesion of ulnar nerve, left upper limb: Secondary | ICD-10-CM | POA: Diagnosis present

## 2023-06-02 DIAGNOSIS — H9192 Unspecified hearing loss, left ear: Secondary | ICD-10-CM | POA: Diagnosis present

## 2023-06-02 DIAGNOSIS — E1140 Type 2 diabetes mellitus with diabetic neuropathy, unspecified: Secondary | ICD-10-CM | POA: Diagnosis present

## 2023-06-02 DIAGNOSIS — Z7902 Long term (current) use of antithrombotics/antiplatelets: Secondary | ICD-10-CM | POA: Diagnosis not present

## 2023-06-02 DIAGNOSIS — Z7984 Long term (current) use of oral hypoglycemic drugs: Secondary | ICD-10-CM | POA: Diagnosis not present

## 2023-06-02 DIAGNOSIS — G992 Myelopathy in diseases classified elsewhere: Secondary | ICD-10-CM | POA: Diagnosis not present

## 2023-06-02 DIAGNOSIS — M5412 Radiculopathy, cervical region: Secondary | ICD-10-CM | POA: Diagnosis not present

## 2023-06-02 HISTORY — PX: ANTERIOR CERVICAL DECOMP/DISCECTOMY FUSION: SHX1161

## 2023-06-02 HISTORY — PX: ULNAR NERVE TRANSPOSITION: SHX2595

## 2023-06-02 LAB — GLUCOSE, CAPILLARY
Glucose-Capillary: 158 mg/dL — ABNORMAL HIGH (ref 70–99)
Glucose-Capillary: 163 mg/dL — ABNORMAL HIGH (ref 70–99)
Glucose-Capillary: 190 mg/dL — ABNORMAL HIGH (ref 70–99)
Glucose-Capillary: 363 mg/dL — ABNORMAL HIGH (ref 70–99)
Glucose-Capillary: 323 mg/dL — ABNORMAL HIGH (ref 70–99)

## 2023-06-02 LAB — HEMOGLOBIN A1C
Hgb A1c MFr Bld: 9.3 % — ABNORMAL HIGH (ref 4.8–5.6)
Mean Plasma Glucose: 220.21 mg/dL

## 2023-06-02 LAB — ABO/RH: ABO/RH(D): O NEG

## 2023-06-02 SURGERY — ANTERIOR CERVICAL DECOMPRESSION/DISCECTOMY FUSION 3 LEVELS
Anesthesia: General | Site: Spine Cervical

## 2023-06-02 MED ORDER — PHENOL 1.4 % MT LIQD
1.0000 | OROMUCOSAL | Status: DC | PRN
  Administered 2023-06-02: 1 via OROMUCOSAL
  Filled 2023-06-02: qty 177

## 2023-06-02 MED ORDER — BACITRACIN ZINC 500 UNIT/GM EX OINT
TOPICAL_OINTMENT | CUTANEOUS | Status: AC
  Filled 2023-06-02: qty 28.35

## 2023-06-02 MED ORDER — LIDOCAINE 2% (20 MG/ML) 5 ML SYRINGE
INTRAMUSCULAR | Status: DC | PRN
  Administered 2023-06-02: 100 mg via INTRAVENOUS

## 2023-06-02 MED ORDER — METHOCARBAMOL 500 MG PO TABS
500.0000 mg | ORAL_TABLET | Freq: Four times a day (QID) | ORAL | Status: DC | PRN
  Administered 2023-06-02: 500 mg via ORAL
  Filled 2023-06-02: qty 1

## 2023-06-02 MED ORDER — SODIUM CHLORIDE 0.9% FLUSH
3.0000 mL | Freq: Two times a day (BID) | INTRAVENOUS | Status: DC
  Administered 2023-06-02 (×2): 3 mL via INTRAVENOUS

## 2023-06-02 MED ORDER — ONDANSETRON HCL 4 MG/2ML IJ SOLN: 4.0000 mg | Freq: Four times a day (QID) | INTRAMUSCULAR | Status: DC | PRN

## 2023-06-02 MED ORDER — SENNA 8.6 MG PO TABS
1.0000 | ORAL_TABLET | Freq: Two times a day (BID) | ORAL | Status: DC | PRN
  Filled 2023-06-02: qty 1

## 2023-06-02 MED ORDER — MUPIROCIN 2 % EX OINT: 1.0000 | TOPICAL_OINTMENT | Freq: Two times a day (BID) | CUTANEOUS | 0 refills | Status: AC

## 2023-06-02 MED ORDER — AMITRIPTYLINE HCL 50 MG PO TABS: 100.0000 mg | ORAL_TABLET | Freq: Every day | ORAL | Status: DC | PRN

## 2023-06-02 MED ORDER — SODIUM CHLORIDE 0.9 % IV SOLN: INTRAVENOUS | Status: DC | PRN

## 2023-06-02 MED ORDER — METHOCARBAMOL 1000 MG/10ML IJ SOLN: 500.0000 mg | Freq: Four times a day (QID) | INTRAMUSCULAR | Status: DC | PRN

## 2023-06-02 MED ORDER — CHLORHEXIDINE GLUCONATE CLOTH 2 % EX PADS: 6.0000 | MEDICATED_PAD | Freq: Once | CUTANEOUS | Status: DC

## 2023-06-02 MED ORDER — ATORVASTATIN CALCIUM 40 MG PO TABS
40.0000 mg | ORAL_TABLET | Freq: Every day | ORAL | Status: DC
  Administered 2023-06-02: 40 mg via ORAL
  Filled 2023-06-02: qty 1

## 2023-06-02 MED ORDER — MENTHOL 3 MG MT LOZG
1.0000 | LOZENGE | OROMUCOSAL | Status: DC | PRN
Start: 2023-06-02 — End: 2023-06-03

## 2023-06-02 MED ORDER — ROCURONIUM BROMIDE 10 MG/ML (PF) SYRINGE
PREFILLED_SYRINGE | INTRAVENOUS | Status: DC | PRN
  Administered 2023-06-02: 30 mg via INTRAVENOUS
  Administered 2023-06-02: 100 mg via INTRAVENOUS

## 2023-06-02 MED ORDER — VANCOMYCIN HCL IN DEXTROSE 1-5 GM/200ML-% IV SOLN
1000.0000 mg | INTRAVENOUS | Status: AC
  Administered 2023-06-02: 1000 mg via INTRAVENOUS
  Filled 2023-06-02: qty 200

## 2023-06-02 MED ORDER — PHENYLEPHRINE HCL-NACL 20-0.9 MG/250ML-% IV SOLN
INTRAVENOUS | Status: AC
  Filled 2023-06-02: qty 500

## 2023-06-02 MED ORDER — AMITRIPTYLINE HCL 25 MG PO TABS: 25.0000 mg | ORAL_TABLET | Freq: Every day | ORAL | Status: DC | PRN

## 2023-06-02 MED ORDER — ONDANSETRON HCL 4 MG/2ML IJ SOLN
INTRAMUSCULAR | Status: DC | PRN
  Administered 2023-06-02: 4 mg via INTRAVENOUS

## 2023-06-02 MED ORDER — PROPOFOL 10 MG/ML IV BOLUS
INTRAVENOUS | Status: DC | PRN
  Administered 2023-06-02: 100 mg via INTRAVENOUS

## 2023-06-02 MED ORDER — OXYCODONE HCL 5 MG PO TABS
10.0000 mg | ORAL_TABLET | ORAL | Status: DC | PRN
Start: 2023-06-02 — End: 2023-06-03
  Administered 2023-06-02: 10 mg via ORAL
  Filled 2023-06-02: qty 2

## 2023-06-02 MED ORDER — ONDANSETRON HCL 4 MG PO TABS: 4.0000 mg | ORAL_TABLET | Freq: Four times a day (QID) | ORAL | Status: DC | PRN

## 2023-06-02 MED ORDER — OXYCODONE HCL 5 MG PO TABS: 5.0000 mg | ORAL_TABLET | Freq: Once | ORAL | Status: DC | PRN

## 2023-06-02 MED ORDER — LIDOCAINE-EPINEPHRINE 1 %-1:100000 IJ SOLN
INTRAMUSCULAR | Status: DC | PRN
  Administered 2023-06-02: 1.5 mL
  Administered 2023-06-02: 3.5 mL

## 2023-06-02 MED ORDER — POLYETHYLENE GLYCOL 3350 17 G PO PACK: 17.0000 g | PACK | Freq: Every day | ORAL | Status: DC | PRN

## 2023-06-02 MED ORDER — SUGAMMADEX SODIUM 200 MG/2ML IV SOLN
INTRAVENOUS | Status: DC | PRN
  Administered 2023-06-02: 200 mg via INTRAVENOUS

## 2023-06-02 MED ORDER — HYDROMORPHONE HCL 1 MG/ML IJ SOLN
INTRAMUSCULAR | Status: AC
  Filled 2023-06-02: qty 0.5

## 2023-06-02 MED ORDER — ACETAMINOPHEN 650 MG RE SUPP: 650.0000 mg | RECTAL | Status: DC | PRN

## 2023-06-02 MED ORDER — MIDAZOLAM HCL 2 MG/2ML IJ SOLN
INTRAMUSCULAR | Status: AC
  Filled 2023-06-02: qty 2

## 2023-06-02 MED ORDER — PROPOFOL 1000 MG/100ML IV EMUL
INTRAVENOUS | Status: AC
  Filled 2023-06-02: qty 200

## 2023-06-02 MED ORDER — ACETAMINOPHEN 10 MG/ML IV SOLN
1000.0000 mg | Freq: Once | INTRAVENOUS | Status: DC | PRN
Start: 2023-06-02 — End: 2023-06-02

## 2023-06-02 MED ORDER — MIDAZOLAM HCL 2 MG/2ML IJ SOLN
INTRAMUSCULAR | Status: DC | PRN
  Administered 2023-06-02: 2 mg via INTRAVENOUS

## 2023-06-02 MED ORDER — BUPIVACAINE HCL (PF) 0.5 % IJ SOLN
INTRAMUSCULAR | Status: AC
  Filled 2023-06-02: qty 30

## 2023-06-02 MED ORDER — OXYCODONE HCL 5 MG PO TABS: 5.0000 mg | ORAL_TABLET | ORAL | Status: DC | PRN

## 2023-06-02 MED ORDER — BUPIVACAINE HCL 0.5 % IJ SOLN
INTRAMUSCULAR | Status: DC | PRN
  Administered 2023-06-02: 3.5 mL
  Administered 2023-06-02: 1.5 mL

## 2023-06-02 MED ORDER — ONDANSETRON HCL 4 MG/2ML IJ SOLN: 4.0000 mg | Freq: Once | INTRAMUSCULAR | Status: DC | PRN

## 2023-06-02 MED ORDER — THROMBIN 5000 UNITS EX SOLR
CUTANEOUS | Status: AC
  Filled 2023-06-02: qty 5000

## 2023-06-02 MED ORDER — PROPOFOL 500 MG/50ML IV EMUL
INTRAVENOUS | Status: DC | PRN
  Administered 2023-06-02: 45 ug/kg/min via INTRAVENOUS

## 2023-06-02 MED ORDER — DEXAMETHASONE SODIUM PHOSPHATE 10 MG/ML IJ SOLN
INTRAMUSCULAR | Status: DC | PRN
  Administered 2023-06-02: 4 mg via INTRAVENOUS

## 2023-06-02 MED ORDER — LACTATED RINGERS IV SOLN: INTRAVENOUS | Status: DC

## 2023-06-02 MED ORDER — HYDROMORPHONE HCL 1 MG/ML IJ SOLN
0.5000 mg | INTRAMUSCULAR | Status: DC | PRN
Start: 2023-06-02 — End: 2023-06-03

## 2023-06-02 MED ORDER — INSULIN ASPART 100 UNIT/ML IJ SOLN
0.0000 [IU] | Freq: Every day | INTRAMUSCULAR | Status: DC
  Administered 2023-06-02: 4 [IU] via SUBCUTANEOUS

## 2023-06-02 MED ORDER — ORAL CARE MOUTH RINSE: 15.0000 mL | Freq: Once | OROMUCOSAL | Status: AC

## 2023-06-02 MED ORDER — PHENYLEPHRINE HCL-NACL 20-0.9 MG/250ML-% IV SOLN
INTRAVENOUS | Status: DC | PRN
  Administered 2023-06-02: 15 ug/min via INTRAVENOUS

## 2023-06-02 MED ORDER — INSULIN ASPART 100 UNIT/ML IJ SOLN
0.0000 [IU] | Freq: Three times a day (TID) | INTRAMUSCULAR | Status: DC
  Administered 2023-06-02: 15 [IU] via SUBCUTANEOUS

## 2023-06-02 MED ORDER — INSULIN ASPART 100 UNIT/ML IJ SOLN
INTRAMUSCULAR | Status: AC
  Filled 2023-06-02: qty 1

## 2023-06-02 MED ORDER — ACETAMINOPHEN 325 MG PO TABS: 650.0000 mg | ORAL_TABLET | ORAL | Status: DC | PRN

## 2023-06-02 MED ORDER — AMITRIPTYLINE HCL 50 MG PO TABS: 125.0000 mg | ORAL_TABLET | Freq: Every evening | ORAL | Status: DC | PRN

## 2023-06-02 MED ORDER — INSULIN ASPART 100 UNIT/ML IJ SOLN
0.0000 [IU] | INTRAMUSCULAR | Status: DC | PRN
Start: 2023-06-02 — End: 2023-06-02
  Administered 2023-06-02: 2 [IU] via SUBCUTANEOUS

## 2023-06-02 MED ORDER — THROMBIN 5000 UNITS EX SOLR: OROMUCOSAL | Status: DC | PRN

## 2023-06-02 MED ORDER — HYDROMORPHONE HCL 1 MG/ML IJ SOLN
INTRAMUSCULAR | Status: DC | PRN
  Administered 2023-06-02 (×3): .1 mg via INTRAVENOUS

## 2023-06-02 MED ORDER — 0.9 % SODIUM CHLORIDE (POUR BTL) OPTIME
TOPICAL | Status: DC | PRN
  Administered 2023-06-02: 1000 mL

## 2023-06-02 MED ORDER — ACETAMINOPHEN 10 MG/ML IV SOLN
INTRAVENOUS | Status: DC | PRN
  Administered 2023-06-02: 1000 mg via INTRAVENOUS

## 2023-06-02 MED ORDER — VASOPRESSIN 20 UNIT/ML IV SOLN
INTRAVENOUS | Status: AC
  Filled 2023-06-02: qty 1

## 2023-06-02 MED ORDER — EMPAGLIFLOZIN 25 MG PO TABS
25.0000 mg | ORAL_TABLET | Freq: Every day | ORAL | Status: DC
  Administered 2023-06-03: 25 mg via ORAL
  Filled 2023-06-02: qty 1

## 2023-06-02 MED ORDER — KETOROLAC TROMETHAMINE 15 MG/ML IJ SOLN
15.0000 mg | Freq: Four times a day (QID) | INTRAMUSCULAR | Status: AC
  Administered 2023-06-02 – 2023-06-03 (×3): 15 mg via INTRAVENOUS
  Filled 2023-06-02 (×3): qty 1

## 2023-06-02 MED ORDER — FENTANYL CITRATE (PF) 250 MCG/5ML IJ SOLN
INTRAMUSCULAR | Status: AC
  Filled 2023-06-02: qty 5

## 2023-06-02 MED ORDER — DOCUSATE SODIUM 100 MG PO CAPS
100.0000 mg | ORAL_CAPSULE | Freq: Two times a day (BID) | ORAL | Status: DC
  Administered 2023-06-02: 100 mg via ORAL
  Filled 2023-06-02 (×3): qty 1

## 2023-06-02 MED ORDER — LIDOCAINE-EPINEPHRINE 1 %-1:100000 IJ SOLN
INTRAMUSCULAR | Status: AC
  Filled 2023-06-02: qty 1

## 2023-06-02 MED ORDER — FENTANYL CITRATE (PF) 100 MCG/2ML IJ SOLN: 25.0000 ug | INTRAMUSCULAR | Status: DC | PRN

## 2023-06-02 MED ORDER — LINAGLIPTIN 5 MG PO TABS
5.0000 mg | ORAL_TABLET | Freq: Every day | ORAL | Status: DC
Start: 2023-06-02 — End: 2023-06-03
  Administered 2023-06-02 – 2023-06-03 (×2): 5 mg via ORAL
  Filled 2023-06-02 (×3): qty 1

## 2023-06-02 MED ORDER — CHLORHEXIDINE GLUCONATE 0.12 % MT SOLN
15.0000 mL | Freq: Once | OROMUCOSAL | Status: AC
  Administered 2023-06-02: 15 mL via OROMUCOSAL
  Filled 2023-06-02: qty 15

## 2023-06-02 MED ORDER — SODIUM CHLORIDE 0.9% FLUSH: 3.0000 mL | INTRAVENOUS | Status: DC | PRN

## 2023-06-02 MED ORDER — FLEET ENEMA RE ENEM: 1.0000 | ENEMA | Freq: Once | RECTAL | Status: DC | PRN

## 2023-06-02 MED ORDER — FENTANYL CITRATE (PF) 250 MCG/5ML IJ SOLN
INTRAMUSCULAR | Status: DC | PRN
  Administered 2023-06-02: 100 ug via INTRAVENOUS
  Administered 2023-06-02: 50 ug via INTRAVENOUS
  Administered 2023-06-02: 100 ug via INTRAVENOUS

## 2023-06-02 MED ORDER — PROPOFOL 10 MG/ML IV BOLUS
INTRAVENOUS | Status: AC
  Filled 2023-06-02: qty 20

## 2023-06-02 MED ORDER — SODIUM CHLORIDE 0.9 % IV SOLN
250.0000 mL | INTRAVENOUS | Status: DC
  Administered 2023-06-02: 250 mL via INTRAVENOUS

## 2023-06-02 MED ORDER — VANCOMYCIN HCL IN DEXTROSE 1-5 GM/200ML-% IV SOLN
1000.0000 mg | Freq: Two times a day (BID) | INTRAVENOUS | Status: DC
  Administered 2023-06-02: 1000 mg via INTRAVENOUS
  Filled 2023-06-02: qty 200

## 2023-06-02 MED ORDER — OXYCODONE HCL 5 MG/5ML PO SOLN: 5.0000 mg | Freq: Once | ORAL | Status: DC | PRN

## 2023-06-02 SURGICAL SUPPLY — 73 items
BAG COUNTER SPONGE SURGICOUNT (BAG) ×4 IMPLANT
BASKET BONE COLLECTION (BASKET) IMPLANT
BENZOIN TINCTURE PRP APPL 2/3 (GAUZE/BANDAGES/DRESSINGS) ×2 IMPLANT
BIT DRILL 13 (BIT) IMPLANT
BIT DRILL NEURO 2X3.1 SFT TUCH (MISCELLANEOUS) ×2 IMPLANT
BLADE CLIPPER SURG (BLADE) IMPLANT
BNDG ELASTIC 3INX 5YD STR LF (GAUZE/BANDAGES/DRESSINGS) ×2 IMPLANT
BNDG ELASTIC 6X10 VLCR STRL LF (GAUZE/BANDAGES/DRESSINGS) IMPLANT
BNDG GAUZE DERMACEA FLUFF 4 (GAUZE/BANDAGES/DRESSINGS) ×2 IMPLANT
BUR MATCHSTICK NEURO 3.0 LAGG (BURR) ×2 IMPLANT
CANISTER SUCT 3000ML PPV (MISCELLANEOUS) ×2 IMPLANT
DEVICE ENDSKLTN IMPL 16X14X7X6 (Cage) IMPLANT
DEVICE ENDSKLTN TC NANOLCK 6MM (Cage) IMPLANT
DRAPE C-ARM 42X72 X-RAY (DRAPES) ×4 IMPLANT
DRAPE EXTREMITY T 121X128X90 (DISPOSABLE) ×2 IMPLANT
DRAPE HALF SHEET 40X57 (DRAPES) ×2 IMPLANT
DRAPE LAPAROTOMY 100X72 PEDS (DRAPES) ×2 IMPLANT
DRAPE MICROSCOPE SLANT 54X150 (MISCELLANEOUS) ×2 IMPLANT
DRESSING MEPILEX FLEX 4X4 (GAUZE/BANDAGES/DRESSINGS) ×2 IMPLANT
DRILL NEURO 2X3.1 SOFT TOUCH (MISCELLANEOUS) ×2
DRSG MEPILEX FLEX 4X4 (GAUZE/BANDAGES/DRESSINGS) ×2
DRSG OPSITE 4X5.5 SM (GAUZE/BANDAGES/DRESSINGS) ×4 IMPLANT
DRSG OPSITE POSTOP 4X6 (GAUZE/BANDAGES/DRESSINGS) IMPLANT
DRSG XEROFORM 1X8 (GAUZE/BANDAGES/DRESSINGS) IMPLANT
DURAPREP 26ML APPLICATOR (WOUND CARE) ×4 IMPLANT
ELECT COATED BLADE 2.86 ST (ELECTRODE) ×4 IMPLANT
ELECT REM PT RETURN 9FT ADLT (ELECTROSURGICAL) ×2
ELECTRODE REM PT RTRN 9FT ADLT (ELECTROSURGICAL) ×2 IMPLANT
ENDOSKELETON IMPLANT 16X14X7X6 (Cage) ×2 IMPLANT
ENDOSKELETON TC NANOLOCK 6MM (Cage) ×4 IMPLANT
GAUZE 4X4 16PLY ~~LOC~~+RFID DBL (SPONGE) ×2 IMPLANT
GAUZE SPONGE 4X4 12PLY STRL (GAUZE/BANDAGES/DRESSINGS) ×2 IMPLANT
GAUZE XEROFORM 1X8 LF (GAUZE/BANDAGES/DRESSINGS) ×2 IMPLANT
GLOVE BIO SURGEON STRL SZ7 (GLOVE) ×8 IMPLANT
GLOVE BIOGEL PI IND STRL 7.5 (GLOVE) ×8 IMPLANT
GLOVE ECLIPSE 7.5 STRL STRAW (GLOVE) ×4 IMPLANT
GLOVE EXAM NITRILE XL STR (GLOVE) IMPLANT
GOWN STRL REUS W/ TWL LRG LVL3 (GOWN DISPOSABLE) ×6 IMPLANT
GOWN STRL REUS W/ TWL XL LVL3 (GOWN DISPOSABLE) ×4 IMPLANT
GOWN STRL REUS W/TWL 2XL LVL3 (GOWN DISPOSABLE) IMPLANT
HEMOSTAT POWDER KIT SURGIFOAM (HEMOSTASIS) ×2 IMPLANT
KIT BASIN OR (CUSTOM PROCEDURE TRAY) ×4 IMPLANT
KIT TURNOVER KIT B (KITS) ×4 IMPLANT
NDL HYPO 22X1.5 SAFETY MO (MISCELLANEOUS) ×2 IMPLANT
NDL HYPO 25X1 1.5 SAFETY (NEEDLE) ×2 IMPLANT
NDL SPNL 22GX3.5 QUINCKE BK (NEEDLE) ×2 IMPLANT
NEEDLE HYPO 22X1.5 SAFETY MO (MISCELLANEOUS) ×2 IMPLANT
NEEDLE HYPO 25X1 1.5 SAFETY (NEEDLE) ×2 IMPLANT
NEEDLE SPNL 22GX3.5 QUINCKE BK (NEEDLE) ×2 IMPLANT
NS IRRIG 1000ML POUR BTL (IV SOLUTION) ×4 IMPLANT
PACK LAMINECTOMY NEURO (CUSTOM PROCEDURE TRAY) ×4 IMPLANT
PAD ARMBOARD 7.5X6 YLW CONV (MISCELLANEOUS) ×12 IMPLANT
PIN DISTRACTION 14MM (PIN) ×4 IMPLANT
PLATE ZEVO 3LVL 53MM (Plate) IMPLANT
PUTTY DBF 3CC CORTICAL FIBERS (Putty) IMPLANT
SCREW 3.5 SELFDRILL 15MM VARI (Screw) IMPLANT
SCREW VA SD 3.5X16 (Screw) IMPLANT
SPIKE FLUID TRANSFER (MISCELLANEOUS) ×4 IMPLANT
SPONGE INTESTINAL PEANUT (DISPOSABLE) ×2 IMPLANT
STAPLER VISISTAT 35W (STAPLE) IMPLANT
STOCKINETTE 4X48 STRL (DRAPES) ×2 IMPLANT
STRIP CLOSURE SKIN 1/2X4 (GAUZE/BANDAGES/DRESSINGS) ×2 IMPLANT
SUT MNCRL AB 4-0 PS2 18 (SUTURE) ×2 IMPLANT
SUT PROLENE 3 0 PS 2 (SUTURE) ×6 IMPLANT
SUT SILK 2 0 TIES 10X30 (SUTURE) IMPLANT
SUT VIC AB 3-0 CP2 18 (SUTURE) ×2 IMPLANT
SUT VIC AB 3-0 SH 8-18 (SUTURE) ×2 IMPLANT
TAPE CLOTH 3X10 TAN LF (GAUZE/BANDAGES/DRESSINGS) ×2 IMPLANT
TIP KERRISON THIN FOOTPLATE 2M (MISCELLANEOUS) ×2 IMPLANT
TOWEL GREEN STERILE (TOWEL DISPOSABLE) ×4 IMPLANT
TOWEL GREEN STERILE FF (TOWEL DISPOSABLE) ×4 IMPLANT
UNDERPAD 30X36 HEAVY ABSORB (UNDERPADS AND DIAPERS) ×2 IMPLANT
WATER STERILE IRR 1000ML POUR (IV SOLUTION) ×4 IMPLANT

## 2023-06-02 NOTE — H&P (Addendum)
CC: hand weakness, neck pain  HPI:     Patient is a 58 y.o. male presents with neck pain and hand weakness.  He is here for elective ACDF and cubital tunnel release.    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) 11/30/2020   Numbness and tingling in left hand 11/22/2020   Hearing loss of right ear due to cerumen impaction 08/10/2020   Hearing loss of left ear 08/10/2020   Balance problem 08/10/2020   Type 2 diabetes mellitus (HCC) 07/10/2020   Hyperlipidemia 07/10/2020   Bell's palsy 07/07/2020   Elevated blood-pressure reading without diagnosis of hypertension 07/07/2020   Past Medical History:  Diagnosis Date   Arthritis    Bell's palsy    left   DM (diabetes mellitus) (HCC)    Type 2   Facial paralysis/Bells palsy    Headache    Migraines   Hx of migraines    Stroke (HCC) 2020   Pt states it was a "Palsy episode but due to increase in symptoms, classified it as mild stroke" leaving pt with current spine/nerve/strength problems    Past Surgical History:  Procedure Laterality Date   KNEE RECONSTRUCTION Left    58 years old   VENTRAL HERNIA REPAIR     As a newborn    Medications Prior to Admission  Medication Sig Dispense Refill Last Dose/Taking   amitriptyline (ELAVIL) 100 MG tablet Take 1 tablet (100 mg total) by mouth at bedtime. (Patient taking differently: Take 100 mg by mouth daily as needed (migraines).) 90 tablet 1 Taking Differently   amitriptyline (ELAVIL) 25 MG tablet Take 1 tablet (25 mg total) by mouth at bedtime. Take in addition to 100mg  tablet for total of 125mg  nightly (Patient taking differently: Take 25 mg by mouth daily as needed (migraines). Take in addition to 100mg  tablet for total of 125mg  nightly) 30 tablet 5 Taking Differently   empagliflozin (JARDIANCE) 25 MG TABS tablet Take 1 tablet (25 mg total) by mouth daily before breakfast. 90 tablet 0 05/28/2023   sitaGLIPtin  (JANUVIA) 25 MG tablet Take 50 mg by mouth daily.   Past Week   atorvastatin (LIPITOR) 40 MG tablet Take 1 tablet (40 mg total) by mouth daily. (Patient not taking: Reported on 05/26/2023) 90 tablet 0    Blood Glucose Monitoring Suppl (TRUE METRIX METER) w/Device KIT Use as directed 1 kit 0    clopidogrel (PLAVIX) 75 MG tablet Take 1 tablet (75 mg total) by mouth daily. (Patient not taking: Reported on 05/26/2023) 30 tablet 11    glucose blood (TRUE METRIX BLOOD GLUCOSE TEST) test strip Use as instructed 100 each 12    Insulin Pen Needle 31G X 8 MM MISC Use as directed 100 each 0    Rimegepant Sulfate (NURTEC) 75 MG TBDP Take 1 tablet (75 mg total) by mouth as needed (migraine rescue). (Patient not taking: Reported on 05/20/2023) 12 tablet 11 Not Taking   Semaglutide, 2 MG/DOSE, 8 MG/3ML SOPN Inject 2 mg as directed once a week. (Patient not taking: Reported on 05/26/2023) 12 mL 0    TRUEplus Lancets 28G MISC Use as directed 100 each 4    Allergies  Allergen Reactions   Penicillins Anaphylaxis   Aspirin Other (See Comments)    Lyme Disease     Social History   Tobacco Use   Smoking status: Never    Passive exposure: Yes   Smokeless tobacco:  Never  Substance Use Topics   Alcohol use: Yes    Comment: occ    Family History  Problem Relation Age of Onset   Cancer Mother    Alzheimer's disease Father      Review of Systems Pertinent items are noted in HPI.  Objective:   Patient Vitals for the past 8 hrs:  BP Temp Pulse Resp SpO2 Height Weight  06/02/23 0620 (!) 141/91 -- -- -- -- -- --  06/02/23 0559 (!) 147/101 98.2 F (36.8 C) 86 18 95 % 5\' 10"  (1.778 m) 77.1 kg   No intake/output data recorded. No intake/output data recorded.      General : Alert, cooperative, no distress, appears stated age   Head:  Normocephalic/atraumatic    Eyes: PERRL, conjunctiva/corneas clear, EOM's intact. Fundi could not be visualized Neck: Supple Chest:  Respirations unlabored Chest wall: no  tenderness or deformity Heart: Regular rate and rhythm Abdomen: Soft, nontender and nondistended Extremities: warm and well-perfused Skin: normal turgor, color and texture Neurologic:  Alert, oriented x 3.  Eyes open spontaneously. PERRL, EOMI, VFC, no facial droop. V1-3 intact.  No dysarthria, tongue protrusion symmetric.  CNII-XII intact.  Left dorsal interossei wasting, adduction 4-/5, right hand grip 4/5, + Spurling's bilaterally       Data ReviewCBC:  Lab Results  Component Value Date   WBC 8.9 05/26/2023   RBC 5.24 05/26/2023   BMP:  Lab Results  Component Value Date   GLUCOSE 167 (H) 05/26/2023   CO2 25 05/26/2023   BUN 13 05/26/2023   BUN 21 02/02/2023   CREATININE 1.12 05/26/2023   CALCIUM 9.6 05/26/2023   Radiology review:  See clinic note  Assessment:   Active Problems:   * No active hospital problems. 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 He has been having progressive functional loss with weakness and pain.I had a long discussion with Beverely Pace. Given the failure of medical and physical therapy and the progressive nature of his symptomatology which is resulting in functional loss, it is reasonable to consider surgical intervention in the form of C4-5, C5-6, C6-7 ACDF as well as left cubital tunnel release. I discussed with him that it is possible in the future he may require in right cubital tunnel release. I discussed with the patient the general technique of surgery, as well as risks, benefits, alternatives, and expected convalescence. Risks discussed included, but were not limited to, bleeding, pain, infection, scar, dysphagia, dysphonia, instability, adjacent segment disease, pseudoarthrosis, damage to nearby organs, spinal fluid leak, neurologic deficit, and death. Informed consent was obtained  and the patient wished to proceed with surgery. All questions and concerns were answered.    Plan:   - ACDF, ulnar nerve decompression today - Risks, benefits, alternatives, and expected convalescence were discussed with the patient.  Risks discussed included, but were not limited to bleeding, pain, infection, dysphagia, dysphonia, scar, spinal fluid leak, neurologic deficit, instability, pseudoarthrosis, adjacent segment disease, damage to nearby organs, and death.  Informed consent was obtained.

## 2023-06-02 NOTE — Plan of Care (Signed)
Problem: Education: Goal: Knowledge of General Education information will improve Description: Including pain rating scale, medication(s)/side effects and non-pharmacologic comfort measures 06/02/2023 1937 by Vincente Liberty, RN Outcome: Progressing 06/02/2023 1937 by Vincente Liberty, RN Outcome: Progressing   Problem: Health Behavior/Discharge Planning: Goal: Ability to manage health-related needs will improve 06/02/2023 1937 by Vincente Liberty, RN Outcome: Progressing 06/02/2023 1937 by Vincente Liberty, RN Outcome: Progressing   Problem: Clinical Measurements: Goal: Ability to maintain clinical measurements within normal limits will improve 06/02/2023 1937 by Vincente Liberty, RN Outcome: Progressing 06/02/2023 1937 by Vincente Liberty, RN Outcome: Progressing Goal: Will remain free from infection 06/02/2023 1937 by Vincente Liberty, RN Outcome: Progressing 06/02/2023 1937 by Vincente Liberty, RN Outcome: Progressing Goal: Diagnostic test results will improve 06/02/2023 1937 by Vincente Liberty, RN Outcome: Progressing 06/02/2023 1937 by Vincente Liberty, RN Outcome: Progressing Goal: Respiratory complications will improve 06/02/2023 1937 by Vincente Liberty, RN Outcome: Progressing 06/02/2023 1937 by Vincente Liberty, RN Outcome: Progressing Goal: Cardiovascular complication will be avoided 06/02/2023 1937 by Vincente Liberty, RN Outcome: Progressing 06/02/2023 1937 by Vincente Liberty, RN Outcome: Progressing   Problem: Activity: Goal: Risk for activity intolerance will decrease 06/02/2023 1937 by Vincente Liberty, RN Outcome: Progressing 06/02/2023 1937 by Vincente Liberty, RN Outcome: Progressing   Problem: Nutrition: Goal: Adequate nutrition will be maintained 06/02/2023 1937 by Vincente Liberty, RN Outcome: Progressing 06/02/2023 1937 by Vincente Liberty, RN Outcome: Progressing   Problem: Coping: Goal: Level of anxiety will decrease 06/02/2023 1937 by Vincente Liberty, RN Outcome: Progressing 06/02/2023 1937 by Vincente Liberty, RN Outcome: Progressing   Problem: Elimination: Goal: Will not experience complications related to bowel motility 06/02/2023 1937 by Vincente Liberty, RN Outcome: Progressing 06/02/2023 1937 by Vincente Liberty, RN Outcome: Progressing Goal: Will not experience complications related to urinary retention 06/02/2023 1937 by Vincente Liberty, RN Outcome: Progressing 06/02/2023 1937 by Vincente Liberty, RN Outcome: Progressing   Problem: Pain Managment: Goal: General experience of comfort will improve and/or be controlled 06/02/2023 1937 by Vincente Liberty, RN Outcome: Progressing 06/02/2023 1937 by Vincente Liberty, RN Outcome: Progressing   Problem: Safety: Goal: Ability to remain free from injury will improve 06/02/2023 1937 by Vincente Liberty, RN Outcome: Progressing 06/02/2023 1937 by Vincente Liberty, RN Outcome: Progressing   Problem: Skin Integrity: Goal: Risk for impaired skin integrity will decrease 06/02/2023 1937 by Vincente Liberty, RN Outcome: Progressing 06/02/2023 1937 by Vincente Liberty, RN Outcome: Progressing   Problem: Education: Goal: Ability to describe self-care measures that may prevent or decrease complications (Diabetes Survival Skills Education) will improve 06/02/2023 1937 by Vincente Liberty, RN Outcome: Progressing 06/02/2023 1937 by Vincente Liberty, RN Outcome: Progressing Goal: Individualized Educational Video(s) 06/02/2023 1937 by Vincente Liberty, RN Outcome: Progressing 06/02/2023 1937 by Vincente Liberty, RN Outcome: Progressing   Problem: Coping: Goal: Ability to adjust to condition or change in health will improve 06/02/2023 1937 by Vincente Liberty, RN Outcome: Progressing 06/02/2023 1937 by Vincente Liberty, RN Outcome: Progressing   Problem: Fluid Volume: Goal: Ability to maintain a balanced intake and output will improve 06/02/2023 1937 by Vincente Liberty,  RN Outcome: Progressing 06/02/2023 1937 by Vincente Liberty, RN Outcome: Progressing   Problem: Health Behavior/Discharge Planning: Goal: Ability to identify and utilize available resources and services will improve 06/02/2023 1937 by Vincente Liberty, RN Outcome: Progressing 06/02/2023 1937 by Vincente Liberty, RN Outcome: Progressing Goal: Ability to manage health-related needs will improve 06/02/2023 1937 by Vincente Liberty, RN Outcome: Progressing 06/02/2023 1937 by Vincente Liberty, RN Outcome: Progressing   Problem: Metabolic: Goal: Ability to maintain appropriate glucose  levels will improve 06/02/2023 1937 by Vincente Liberty, RN Outcome: Progressing 06/02/2023 1937 by Vincente Liberty, RN Outcome: Progressing   Problem: Nutritional: Goal: Maintenance of adequate nutrition will improve 06/02/2023 1937 by Vincente Liberty, RN Outcome: Progressing 06/02/2023 1937 by Vincente Liberty, RN Outcome: Progressing Goal: Progress toward achieving an optimal weight will improve 06/02/2023 1937 by Vincente Liberty, RN Outcome: Progressing 06/02/2023 1937 by Vincente Liberty, RN Outcome: Progressing   Problem: Skin Integrity: Goal: Risk for impaired skin integrity will decrease 06/02/2023 1937 by Vincente Liberty, RN Outcome: Progressing 06/02/2023 1937 by Vincente Liberty, RN Outcome: Progressing   Problem: Tissue Perfusion: Goal: Adequacy of tissue perfusion will improve 06/02/2023 1937 by Vincente Liberty, RN Outcome: Progressing 06/02/2023 1937 by Vincente Liberty, RN Outcome: Progressing   Problem: Education: Goal: Ability to verbalize activity precautions or restrictions will improve 06/02/2023 1937 by Vincente Liberty, RN Outcome: Progressing 06/02/2023 1937 by Vincente Liberty, RN Outcome: Progressing Goal: Knowledge of the prescribed therapeutic regimen will improve 06/02/2023 1937 by Vincente Liberty, RN Outcome: Progressing 06/02/2023 1937 by Vincente Liberty,  RN Outcome: Progressing Goal: Understanding of discharge needs will improve 06/02/2023 1937 by Vincente Liberty, RN Outcome: Progressing 06/02/2023 1937 by Vincente Liberty, RN Outcome: Progressing   Problem: Activity: Goal: Ability to avoid complications of mobility impairment will improve 06/02/2023 1937 by Vincente Liberty, RN Outcome: Progressing 06/02/2023 1937 by Vincente Liberty, RN Outcome: Progressing Goal: Ability to tolerate increased activity will improve 06/02/2023 1937 by Vincente Liberty, RN Outcome: Progressing 06/02/2023 1937 by Vincente Liberty, RN Outcome: Progressing Goal: Will remain free from falls 06/02/2023 1937 by Vincente Liberty, RN Outcome: Progressing 06/02/2023 1937 by Vincente Liberty, RN Outcome: Progressing   Problem: Bowel/Gastric: Goal: Gastrointestinal status for postoperative course will improve 06/02/2023 1937 by Vincente Liberty, RN Outcome: Progressing 06/02/2023 1937 by Vincente Liberty, RN Outcome: Progressing   Problem: Clinical Measurements: Goal: Ability to maintain clinical measurements within normal limits will improve 06/02/2023 1937 by Vincente Liberty, RN Outcome: Progressing 06/02/2023 1937 by Vincente Liberty, RN Outcome: Progressing Goal: Postoperative complications will be avoided or minimized 06/02/2023 1937 by Vincente Liberty, RN Outcome: Progressing 06/02/2023 1937 by Vincente Liberty, RN Outcome: Progressing Goal: Diagnostic test results will improve 06/02/2023 1937 by Vincente Liberty, RN Outcome: Progressing 06/02/2023 1937 by Vincente Liberty, RN Outcome: Progressing   Problem: Pain Management: Goal: Pain level will decrease 06/02/2023 1937 by Vincente Liberty, RN Outcome: Progressing 06/02/2023 1937 by Vincente Liberty, RN Outcome: Progressing   Problem: Skin Integrity: Goal: Will show signs of wound healing 06/02/2023 1937 by Vincente Liberty, RN Outcome: Progressing 06/02/2023 1937 by Vincente Liberty,  RN Outcome: Progressing   Problem: Health Behavior/Discharge Planning: Goal: Identification of resources available to assist in meeting health care needs will improve 06/02/2023 1937 by Vincente Liberty, RN Outcome: Progressing 06/02/2023 1937 by Vincente Liberty, RN Outcome: Progressing   Problem: Bladder/Genitourinary: Goal: Urinary functional status for postoperative course will improve 06/02/2023 1937 by Vincente Liberty, RN Outcome: Progressing 06/02/2023 1937 by Vincente Liberty, RN Outcome: Progressing

## 2023-06-02 NOTE — Op Note (Signed)
PREOP DIAGNOSIS: Cervical myeloradiculopathy, left ulnar neuropathy  POSTOP DIAGNOSIS: Cervical myeloradiculopathy,  left ulnar neuropathy  PROCEDURE: 1. Arthrodesis, additional level C4-5 anterior interbody technique, including Discectomy for decompression of spinal cord and exiting nerve roots with foraminotomies  2. Arthrodesis, additional level C5-6 anterior interbody technique, including Discectomy for decompression of spinal cord and exiting nerve roots with foraminotomies  3. Arthrodesis, additional level C6-7 anterior interbody technique, including Discectomy for decompression of spinal cord and exiting nerve roots with foraminotomies  4. Placement of intervertebral biomechanical device C4-5 5. Placement of intervertebral biomechanical device C5-6 6. Placement of intervertebral biomechanical device C6-7 7. Placement of anterior instrumentation consisting of interbody plate and screws C4-5-6-7 8.  Neurolysis/in situ decompression of ulnar nerve at left cubital tunnel 8. Use of morselized bone allograft  9. Use of intraoperative microscope  SURGEON: Dr. Hoyt Koch, MD  ASSISTANT:  Patrici Ranks, PA. Please note there were no qualified trainees available to assist with the procedure.  Assistance required for retraction of the visceral structures to allow for safe instrumentation.  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 He has been having progressive functional loss with weakness and  pain.   Risks, benefits, alternatives, and expected convalescence were discussed with the patient.  Risks discussed included but were not limited to bleeding, pain, infection, dysphagia, dysphonia, pseudoarthrosis, hardware failure, adjacent segment disease, CSF leak, neurologic deficits, weakness, numbness, paralysis, coma, and death. After all questions were answered, informed consent was obtained and he wished to proceed with C4-5, C5-6, C6-7 ACDF as well as left cubital tunnel release.   PROCEDURE IN DETAIL: The patient was brought to the operating room and transferred to the operative table. After induction of general anesthesia, the patient was positioned on the operative table in the supine position with all pressure points meticulously padded. The skin of the neck was then prepped and draped in the usual sterile fashion.  After timeout was conducted, the skin was infiltrated with local anesthetic. Skin incision was then made sharply and Bovie electrocautery was used to dissect the subcutaneous tissue until the platysma was identified. The platysma was then divided and undermined. The sternocleidomastoid muscle was then identified and, utilizing natural fascial planes in the neck, the prevertebral fascia was identified and the carotid sheath was retracted laterally and the trachea and esophagus retracted medially. Again using fluoroscopy, the C4-5 disc space was identified. Bovie electrocautery was used to dissect in the subperiosteal plane and elevate the bilateral longus coli muscles. Self-retaining retractors were then placed over the C6-7 disc space. Caspar distraction pins were placed in the adjacent bodies to allow for gentle distraction.  At this point, the microscope was draped and brought into the field, and the remainder of the case was done under the microscope using microdissecting technique.  The collapsed disc space was incised sharply and combination of high speed drill, curettes, and  rongeurs were use to initially complete a discectomy.  The disc space was distracted open with the caspar distraction. The high-speed drill was then used  to complete discectomy until the posterior annulus was identified and removed and the posterior longitudinal ligament was identified. Using a nerve hook, the PLL was elevated, and Kerrison rongeurs were used to remove the posterior longitudinal ligament and the ventral thecal sac was identified.  Using a combination of curettes and rongeurs, complete decompression of the thecal sac and exiting nerve roots at this level was completed, and verified with easy passage of micro-nerve hook centrally and in the bilateral foramina.  Having completed our decompression, attention was turned to placement of the intervertebral device. Trial spacers were used to select a size 6 mm graft. This graft was then filled with morcellized allograft, and inserted under live fluoroscopy.  Attention was then turned to the C5-6 level. Caspar distraction pin was placed in the adjacent body to allow for gentle distraction of the disc space.  In a similar fashion, discectomy was completed initially with curettes and rongeurs, and completed with the drill. The disc space was distracted open with the caspar distraction.  The PLL was again identified, elevated and incised. Using Kerrison rongeurs, decompression of the spinal cord and exiting roots was completed and confirmed with a dissector. Trial spacers were used to select a 6 mm graft. This graft was then filled with morcellized allograft, and inserted under live fluoroscopy.  Attention was then turned to the C5-6 level. Caspar distraction pin was placed in the adjacent body to allow for gentle distraction of the disc space.  In a similar fashion, discectomy was completed initially with curettes and rongeurs, and completed with the drill. The PLL was again identified, elevated and incised. Using Kerrison rongeurs, decompression of the  spinal cord and exiting roots was completed and confirmed with a dissector. Trial spacers were used to select a 6 mm graft. This graft was then filled with morcellized allograft, and inserted under live fluoroscopy.  Attention was then turned to the C4-5 level. Caspar distraction pin was placed in the adjacent body to allow for gentle distraction of the disc space.  In a similar fashion, discectomy was completed initially with curettes and rongeurs, and completed with the drill. The PLL was again identified, elevated and incised. Using Kerrison rongeurs, decompression of the spinal cord and exiting roots was completed and confirmed with a dissector. Trial spacers were used to select a 7 mm graft. This graft was then filled with morcellized allograft, and inserted under live fluoroscopy.  After placement of the intervertebral devices, the caspar pins were removed.  An anterior cervical plate was placed across the interspaces for anterior fixation.  Using a high-speed drill, the cortex of the cervical vertebral bodies was punctured, and screws inserted in the vertebral bodies. Final fluoroscopic images in AP and lateral projections were taken to confirm good hardware placement.  At this point, after all counts were verified to be correct, meticulous hemostasis was secured using a combination of bipolar electrocautery and passive hemostatics.  A medium Hemovac drain was placed in the deep cervical space and tunneled out the skin and secured with a stitch.  The platysma muscle was then closed using interrupted 3-0 Vicryl sutures, and the skin was closed with a 4-0 monocryl in subcuticular fashion followed by steri-strips. Sterile dressings were then applied and the drapes removed.  His left upper extremity was prepped and draped in standard sterile fashion. The medial epicondyle and the olecranon tip were well palpated. The incision was initiated at equidistant between the olecranon and the medial epicondyle  extending 3 cm proximally and 3  cm distally. Subcutaneous dissection was performed bluntly, preserving sensory branches of the MABC.  The ulnar nerve was identified proximally. It was mobilized with a blunt and a sharp dissection proximally to the arcade of Struthers, which was released sharply. Osborne's fascia appeared to be quite thickened and the roof of the cubital tunnel was then incised and the nerve was mobilized distally to its motor branches. The ulnar nerve was well-isolated before it entered the cubital tunnel. . The fascia was elevated from the nerve and both the FCU fascia and the Osborne fascia were divided protecting the nerve under direct visualization. Distally, the dissection was carried between the 2 heads of the FCU.   Decompression of the nerve was performed between the heads of the FCU. The muscular branches were well protected. Similarly, the cutaneous branches in the arm and forearm were well protected. The venous plexus proximally and distally were well protected. The nerve was well mobilized from the cubital tunnel preserving the small longitudinal vessels accompanying it. Proximally, multiple vascular leashes were defined near the incision of the septum into the medial epicondyle, which were also protected. Once the in situ decompression of the ulnar nerve was performed proximally and distally, the elbow was flexed and extended. There was no evidence of any subluxation. Satisfactory decompression was performed.  Hemostasis was achieved. Skin was closed with 3-0 Prolene vertical mattress stitches. A well-padded dressing was applied. The patient was then extubated and transferred to the recovery room in stable condition. There were no intraoperative complications noted. The patient tolerated the procedure very well.  The patient tolerated the procedure well and was extubated in the room and taken to the postanesthesia care unit in stable condition.  All counts were correct at the end of  the procedure.

## 2023-06-02 NOTE — Transfer of Care (Signed)
Immediate Anesthesia Transfer of Care Note  Patient: Matthew Jennings  Procedure(s) Performed: CERVICAL FOUR-FIVE, CERVICAL FIVE-SIX, CERVICAL SIX-SEVEN ANTERIOR CERVICAL DECOMPRESSION/DISCECTOMY FUSION (Spine Cervical) ULNAR NERVE DECOMPRESSION/TRANSPOSITION (Left: Arm Lower)  Patient Location: PACU  Anesthesia Type:General  Level of Consciousness: sedated  Airway & Oxygen Therapy: Patient Spontanous Breathing  Post-op Assessment: Report given to RN  Post vital signs: Reviewed and stable  Last Vitals:  Vitals Value Taken Time  BP 101/68 06/02/23 1235  Temp    Pulse 62 06/02/23 1238  Resp 12 06/02/23 1238  SpO2 99 % 06/02/23 1238  Vitals shown include unfiled device data.  Last Pain:  Vitals:   06/02/23 0620  PainSc: 5          Complications: No notable events documented.

## 2023-06-02 NOTE — Progress Notes (Signed)
Pharmacy Antibiotic Note  Matthew Jennings is a 58 y.o. male admitted on 06/02/2023 with surgical prophylaxis.  Pharmacy has been consulted for Vancomycin dosing. Pt received Vanc 1gm pre-op ~0700  Pt with hemovac drain in place.  Plan: Vancomycin 1gm IV q12h Will f/u renal function, drain removal, and pt's clinical condition  Height: 5\' 10"  (177.8 cm) Weight: 77.1 kg (170 lb) IBW/kg (Calculated) : 73  Temp (24hrs), Avg:98 F (36.7 C), Min:97.9 F (36.6 C), Max:98.2 F (36.8 C)  No results for input(s): "WBC", "CREATININE", "LATICACIDVEN", "VANCOTROUGH", "VANCOPEAK", "VANCORANDOM", "GENTTROUGH", "GENTPEAK", "GENTRANDOM", "TOBRATROUGH", "TOBRAPEAK", "TOBRARND", "AMIKACINPEAK", "AMIKACINTROU", "AMIKACIN" in the last 168 hours.  Estimated Creatinine Clearance: 74.2 mL/min (by C-G formula based on SCr of 1.12 mg/dL).    Allergies  Allergen Reactions   Penicillins Anaphylaxis   Aspirin Other (See Comments)    Lyme Disease     Thank you for allowing pharmacy to be a part of this patient's care.  Christoper Fabian, PharmD, BCPS Please see amion for complete clinical pharmacist phone list 06/02/2023 2:22 PM

## 2023-06-02 NOTE — Progress Notes (Signed)
Orthopedic Tech Progress Note Patient Details:  Matthew Jennings [birth year] [id number]  PACU RN called requesting an ASPEN COLLAR, so I called in stat order to [facility]   Patient ID: Matthew Jennings, male   DOB: November 18, 1965, 58 y.o.   MRN: [id number]  Matthew Jennings 06/02/2023, 1:15 PM

## 2023-06-03 LAB — GLUCOSE, CAPILLARY: Glucose-Capillary: 192 mg/dL — ABNORMAL HIGH (ref 70–99)

## 2023-06-03 MED ORDER — OXYCODONE HCL 10 MG PO TABS: 10.0000 mg | ORAL_TABLET | ORAL | 0 refills | Status: DC | PRN

## 2023-06-03 MED ORDER — DOCUSATE SODIUM 100 MG PO CAPS: 100.0000 mg | ORAL_CAPSULE | Freq: Two times a day (BID) | ORAL | 0 refills | Status: DC

## 2023-06-03 MED ORDER — METHOCARBAMOL 500 MG PO TABS: 500.0000 mg | ORAL_TABLET | Freq: Four times a day (QID) | ORAL | 0 refills | Status: DC | PRN

## 2023-06-03 NOTE — Progress Notes (Signed)
 Patient alert and oriented, mae's well, voiding adequate amount of urine, swallowing without difficulty, no c/o pain at time of discharge. Patient discharged home with family. Script and discharged instructions given to patient. Patient and family stated understanding of instructions given. Patient has an appointment with Dr. Maisie Fus in 2 weeks

## 2023-06-03 NOTE — Discharge Instructions (Signed)
   Wound Care Keep incision covered and dry until post op day 3. You may remove the Honeycomb dressing on post op day 3. Leave steri-strips on back.  They will fall off by themselves. Do not put any creams, lotions, or ointments on incision. You are fine to shower. Let water run over incision and pat dry.  Activity Walk each and every day, increasing distance each day. No lifting greater than 8 lbs.  Avoid excessive back motion. No driving, or riding a car unless coming back and forth to see the doctor  Diet Resume your normal diet.   Return to Work Will be discussed at your follow up appointment.  Call Your Doctor If Any of These Occur Redness, drainage, or swelling at the wound.  Temperature greater than 101 degrees. Severe pain not relieved by pain medication. Incision starts to come apart.  Follow Up Appt Call [phone] if you have one or any problem.

## 2023-06-03 NOTE — Evaluation (Signed)
Occupational Therapy Evaluation Patient Details Name: Matthew Jennings MRN: [id number] DOB: October 13, 1965 Today's Date: 06/03/2023   History of Present Illness SAVIER KADLEC is a 58 yo male who underwent ACDF 4-7 and decompression of ulnar nerve at left cubital tunnel 1/21. PMHx: arthritis, DM, hells palsy, CVA   Clinical Impression   Hezikiah was evaluated s/p the above spine surgery. He lives alone and is indep at baseline. Upon evaluation pt was limited by surgical pain, cervical precautions, L elbow surgical pain, ongoing L ulnar distribution paraesthesias in the hand, knowledge of compensatory techniques and decreased activity tolerance. Overall he demonstrated mod I/indep ability to complete ADLs and mobility without DME or AD. Provided cues and education on spinal precautions and compensatory techniques throughout, handout provided and pt demonstrated fair recall during ADLs and mobility, limited by mild impulsivity. Pt does not require further acute OT services. Recommend d/c home with support of family.         If plan is discharge home, recommend the following: Assist for transportation;Assistance with cooking/housework    Functional Status Assessment  Patient has had a recent decline in their functional status and demonstrates the ability to make significant improvements in function in a reasonable and predictable amount of time.  Equipment Recommendations  None recommended by OT       Precautions / Restrictions Precautions Precautions: Fall;Cervical Precaution Booklet Issued: Yes (comment) Required Braces or Orthoses: Cervical Brace Cervical Brace: Hard collar Restrictions Weight Bearing Restrictions Per Provider Order: No      Mobility Bed Mobility Overal bed mobility: Needs Assistance Bed Mobility: Supine to Sit     Supine to sit: Supervision     General bed mobility comments: cued on log roll, pt transitioned to log sitting instead    Transfers Overall transfer  level: Independent Equipment used: None                      Balance Overall balance assessment: No apparent balance deficits (not formally assessed)                                         ADL either performed or assessed with clinical judgement   ADL Overall ADL's : Modified independent                                       General ADL Comments: Pt demonstrated mod I ability to complete ADLs after review of compensatory techniques, he does not need DME or AD. He is able to get into figure four position     Vision Baseline Vision/History: 0 No visual deficits Vision Assessment?: No apparent visual deficits     Perception Perception: Within Functional Limits       Praxis Praxis: WFL       Pertinent Vitals/Pain Pain Assessment Pain Assessment: Faces Faces Pain Scale: Hurts a little bit Pain Location: neck Pain Descriptors / Indicators: Discomfort, Grimacing, Guarding Pain Intervention(s): Limited activity within patient's tolerance, Monitored during session     Extremity/Trunk Assessment Upper Extremity Assessment Upper Extremity Assessment: Overall WFL for tasks assessed;LUE deficits/detail LUE Deficits / Details: wraped with ace bandage, continues to report ular distribution paraesthesias - otherwise he is using the LUE functionally LUE Sensation: WNL LUE Coordination: WNL   Lower Extremity Assessment Lower Extremity Assessment:  Overall [facility] for tasks assessed   Cervical / Trunk Assessment Cervical / Trunk Assessment: Neck Surgery   Communication Communication Communication: No apparent difficulties   Cognition Arousal: Alert Behavior During Therapy: WFL for tasks assessed/performed Overall Cognitive Status: Within Functional Limits for tasks assessed                                 General Comments: mildly impulsive, needed cues for cervical precautions     General Comments  VSS on RA             Home Living Family/patient expects to be discharged to:: Private residence Living Arrangements: Alone Available Help at Discharge: Family Type of Home: House Home Access: Ramped entrance     Home Layout: One level     Bathroom Shower/Tub: Chief Strategy Officer: Standard     Home Equipment: None   Additional Comments: plans to stay at brothers house for a few days      Prior Functioning/Environment Prior Level of Function : Independent/Modified Independent;Driving             Mobility Comments: no AD ADLs Comments: indep, does not work        OT Problem List: Decreased activity tolerance;Decreased knowledge of precautions         OT Goals(Current goals can be found in the care plan section) Acute Rehab OT Goals Patient Stated Goal: home OT Goal Formulation: With patient Time For Goal Achievement: 06/03/23 Potential to Achieve Goals: Good   AM-PAC OT "6 Clicks" Daily Activity     Outcome Measure Help from another person eating meals?: None Help from another person taking care of personal grooming?: None Help from another person toileting, which includes using toliet, bedpan, or urinal?: None Help from another person bathing (including washing, rinsing, drying)?: None Help from another person to put on and taking off regular upper body clothing?: None Help from another person to put on and taking off regular lower body clothing?: None 6 Click Score: 24   End of Session Equipment Utilized During Treatment: Cervical collar Nurse Communication: Mobility status  Activity Tolerance: Patient tolerated treatment well Patient left: in bed  OT Visit Diagnosis: Muscle weakness (generalized) (M62.81);Pain                Time: 1610-9604 OT Time Calculation (min): 20 min Charges:  OT General Charges $OT Visit: 1 Visit OT Evaluation $OT Eval Low Complexity: 1 Low  Derenda Mis, OTR/L Acute Rehabilitation Services Office [phone] Secure  Chat Communication Preferred   Donia Pounds 06/03/2023, 10:03 AM

## 2023-06-03 NOTE — Anesthesia Postprocedure Evaluation (Signed)
Anesthesia Post Note  Patient: Matthew Jennings  Procedure(s) Performed: CERVICAL FOUR-FIVE, CERVICAL FIVE-SIX, CERVICAL SIX-SEVEN ANTERIOR CERVICAL DECOMPRESSION/DISCECTOMY FUSION (Spine Cervical) ULNAR NERVE DECOMPRESSION/TRANSPOSITION (Left: Arm Lower)     Patient location during evaluation: PACU Anesthesia Type: General Level of consciousness: awake and alert Pain management: pain level controlled Vital Signs Assessment: post-procedure vital signs reviewed and stable Respiratory status: spontaneous breathing, nonlabored ventilation and respiratory function stable Cardiovascular status: blood pressure returned to baseline and stable Postop Assessment: no apparent nausea or vomiting Anesthetic complications: no   No notable events documented.  Last Vitals:    Last Pain:                 Collene Schlichter

## 2023-06-03 NOTE — Plan of Care (Signed)
  Problem: Education: Goal: Knowledge of General Education information will improve Description: Including pain rating scale, medication(s)/side effects and non-pharmacologic comfort measures Outcome: Completed/Met   Problem: Health Behavior/Discharge Planning: Goal: Ability to manage health-related needs will improve Outcome: Completed/Met   Problem: Clinical Measurements: Goal: Ability to maintain clinical measurements within normal limits will improve Outcome: Completed/Met Goal: Will remain free from infection Outcome: Completed/Met Goal: Diagnostic test results will improve Outcome: Completed/Met Goal: Respiratory complications will improve Outcome: Completed/Met Goal: Cardiovascular complication will be avoided Outcome: Completed/Met   Problem: Activity: Goal: Risk for activity intolerance will decrease Outcome: Completed/Met   Problem: Nutrition: Goal: Adequate nutrition will be maintained Outcome: Completed/Met   Problem: Coping: Goal: Level of anxiety will decrease Outcome: Completed/Met   Problem: Elimination: Goal: Will not experience complications related to bowel motility Outcome: Completed/Met Goal: Will not experience complications related to urinary retention Outcome: Completed/Met   Problem: Pain Managment: Goal: General experience of comfort will improve and/or be controlled Outcome: Completed/Met   Problem: Safety: Goal: Ability to remain free from injury will improve Outcome: Completed/Met   Problem: Skin Integrity: Goal: Risk for impaired skin integrity will decrease Outcome: Completed/Met   Problem: Education: Goal: Ability to describe self-care measures that may prevent or decrease complications (Diabetes Survival Skills Education) will improve Outcome: Completed/Met Goal: Individualized Educational Video(s) Outcome: Completed/Met   Problem: Coping: Goal: Ability to adjust to condition or change in health will improve Outcome:  Completed/Met   Problem: Fluid Volume: Goal: Ability to maintain a balanced intake and output will improve Outcome: Completed/Met   Problem: Health Behavior/Discharge Planning: Goal: Ability to identify and utilize available resources and services will improve Outcome: Completed/Met Goal: Ability to manage health-related needs will improve Outcome: Completed/Met   Problem: Metabolic: Goal: Ability to maintain appropriate glucose levels will improve Outcome: Completed/Met   Problem: Nutritional: Goal: Maintenance of adequate nutrition will improve Outcome: Completed/Met Goal: Progress toward achieving an optimal weight will improve Outcome: Completed/Met   Problem: Skin Integrity: Goal: Risk for impaired skin integrity will decrease Outcome: Completed/Met   Problem: Tissue Perfusion: Goal: Adequacy of tissue perfusion will improve Outcome: Completed/Met   Problem: Education: Goal: Ability to verbalize activity precautions or restrictions will improve Outcome: Completed/Met Goal: Knowledge of the prescribed therapeutic regimen will improve Outcome: Completed/Met Goal: Understanding of discharge needs will improve Outcome: Completed/Met   Problem: Activity: Goal: Ability to avoid complications of mobility impairment will improve Outcome: Completed/Met Goal: Ability to tolerate increased activity will improve Outcome: Completed/Met Goal: Will remain free from falls Outcome: Completed/Met   Problem: Bowel/Gastric: Goal: Gastrointestinal status for postoperative course will improve Outcome: Completed/Met   Problem: Clinical Measurements: Goal: Ability to maintain clinical measurements within normal limits will improve Outcome: Completed/Met Goal: Postoperative complications will be avoided or minimized Outcome: Completed/Met Goal: Diagnostic test results will improve Outcome: Completed/Met   Problem: Pain Management: Goal: Pain level will decrease Outcome:  Completed/Met   Problem: Skin Integrity: Goal: Will show signs of wound healing Outcome: Completed/Met   Problem: Health Behavior/Discharge Planning: Goal: Identification of resources available to assist in meeting health care needs will improve Outcome: Completed/Met   Problem: Bladder/Genitourinary: Goal: Urinary functional status for postoperative course will improve Outcome: Completed/Met

## 2023-06-03 NOTE — Discharge Summary (Signed)
Patient ID: INFINITE NAYLOR MRN: [id number] DOB/AGE: 1966-01-31 58 y.o.  Admit date: 06/02/2023 Discharge date: 06/03/2023  Admission Diagnoses: Cervical spinal stenosis [M48.02]   Discharge Diagnoses:  Same   Discharged Condition: Stable  Hospital Course:  Matthew Jennings is a 58 y.o. male who was admitted following an uncomplicated ACDF C4-7 and a L cubital tunnel release. They were recovered in PACU and transferred to the floor. Hospital course was uncomplicated. Pt stable for discharge today. Pt to f/u in office for routine post op visit. Pt is in agreement w/ plan.    Discharge Exam: Blood pressure 117/74, pulse 90, temperature 98.1 F (36.7 C), temperature source Oral, resp. rate 18, height 5\' 10"  (1.778 m), weight 77.1 kg, SpO2 98%. A&O x3 Speech fluent, appropriate CN grossly intact Strength grossly intact.  R grip strength 4/5.  SILTx4.  Dressing c/d/I.   Disposition: Discharge disposition: 01-Home or Self Care       Discharge Instructions     Incentive spirometry RT   Complete by: As directed       Allergies as of 06/03/2023       Reactions   Penicillins Anaphylaxis   Aspirin Other (See Comments)   Lyme Disease         Medication List     PAUSE taking these medications    clopidogrel 75 MG tablet Wait to take this until: June 09, 2023 Commonly known as: PLAVIX Take 1 tablet (75 mg total) by mouth daily.       TAKE these medications    amitriptyline 100 MG tablet Commonly known as: ELAVIL Take 1 tablet (100 mg total) by mouth at bedtime. What changed:  when to take this reasons to take this   amitriptyline 25 MG tablet Commonly known as: ELAVIL Take 1 tablet (25 mg total) by mouth at bedtime. Take in addition to 100mg  tablet for total of 125mg  nightly What changed:  when to take this reasons to take this   atorvastatin 40 MG tablet Commonly known as: LIPITOR Take 1 tablet (40 mg total) by mouth daily.   docusate sodium  100 MG capsule Commonly known as: COLACE Take 1 capsule (100 mg total) by mouth 2 (two) times daily.   empagliflozin 25 MG Tabs tablet Commonly known as: Jardiance Take 1 tablet (25 mg total) by mouth daily before breakfast.   Insulin Pen Needle 31G X 8 MM Misc Use as directed   methocarbamol 500 MG tablet Commonly known as: ROBAXIN Take 1 tablet (500 mg total) by mouth every 6 (six) hours as needed for muscle spasms.   mupirocin ointment 2 % Commonly known as: BACTROBAN Place 1 Application into the nose 2 (two) times daily for 60 doses. Use as directed 2 times daily for 5 days every other week for 6 weeks.   Nurtec 75 MG Tbdp Generic drug: Rimegepant Sulfate Take 1 tablet (75 mg total) by mouth as needed (migraine rescue).   Oxycodone HCl 10 MG Tabs Take 1 tablet (10 mg total) by mouth every 4 (four) hours as needed for severe pain (pain score 7-10).   Semaglutide (2 MG/DOSE) 8 MG/3ML Sopn Inject 2 mg as directed once a week.   sitaGLIPtin 25 MG tablet Commonly known as: JANUVIA Take 50 mg by mouth daily.   True Metrix Blood Glucose Test test strip Generic drug: glucose blood Use as instructed   True Metrix Meter w/Device Kit Use as directed   TRUEplus Lancets 28G Misc Use as directed  Follow-up Information     Bedelia Person, MD. Call.   Specialty: Neurosurgery Why: As needed, If symptoms worsen Contact information: [address] [place] [address] [phone]                 Signed: Clovis Riley 06/03/2023, 11:19 AM

## 2023-06-04 ENCOUNTER — Encounter (HOSPITAL_COMMUNITY): Payer: Self-pay | Admitting: Neurosurgery

## 2023-06-04 ENCOUNTER — Telehealth: Payer: Self-pay

## 2023-06-04 NOTE — Transitions of Care (Post Inpatient/ED Visit) (Signed)
   06/04/2023  Name: Matthew Jennings MRN: [id number] DOB: 1966/04/01  Today's TOC FU Call Status: Today's TOC FU Call Status:: Unsuccessful Call (1st Attempt) Unsuccessful Call (1st Attempt) Date: 06/04/23  Attempted to reach the patient regarding the most recent Inpatient/ED visit.  Follow Up Plan: Additional outreach attempts will be made to reach the patient to complete the Transitions of Care (Post Inpatient/ED visit) call.   Signature  Robyne Peers, RN

## 2023-06-05 ENCOUNTER — Telehealth: Payer: Self-pay

## 2023-06-05 NOTE — Transitions of Care (Post Inpatient/ED Visit) (Signed)
   06/05/2023  Name: FADIL MACMASTER MRN: [id number] DOB: 1966/02/27  Today's TOC FU Call Status: Today's TOC FU Call Status:: Unsuccessful Call (2nd Attempt) Unsuccessful Call (2nd Attempt) Date: 06/05/23  Attempted to reach the patient regarding the most recent Inpatient/ED visit.  Follow Up Plan: Additional outreach attempts will be made to reach the patient to complete the Transitions of Care (Post Inpatient/ED visit) call.   Signature Elsie Lincoln, RN

## 2023-06-05 NOTE — Transitions of Care (Post Inpatient/ED Visit) (Signed)
   06/05/2023  Name: Matthew Jennings MRN: [id number] DOB: 1965/09/13  Today's TOC FU Call Status: Today's TOC FU Call Status:: Successful TOC FU Call Completed Patient's Name and Date of Birth confirmed.  Transition Care Management Follow-up Telephone Call Date of Discharge: 06/03/23 Discharge Facility: [facility]) Type of Discharge: Inpatient Admission Primary Inpatient Discharge Diagnosis:: Cervical Spinal Stenosis How have you been since you were released from the hospital?: Better Any questions or concerns?: No  Items Reviewed: Did you receive and understand the discharge instructions provided?: Yes Medications obtained,verified, and reconciled?: Yes (Medications Reviewed) Any new allergies since your discharge?: No Dietary orders reviewed?: Yes Type of Diet Ordered:: Heart healthy low carb Do you have support at home?: Yes People in Home: sibling(s) Name of Support/Comfort Primary Source: bother -Timothy  Medications Reviewed Today: Medications Reviewed Today   Medications were not reviewed in this encounter     Home Care and Equipment/Supplies: Were Home Health Services Ordered?: No Any new equipment or medical supplies ordered?: No  Functional Questionnaire: Do you need assistance with bathing/showering or dressing?: No Do you need assistance with meal preparation?: Yes Do you need assistance with eating?: No Do you have difficulty maintaining continence: No Do you need assistance with getting out of bed/getting out of a chair/moving?: No Do you have difficulty managing or taking your medications?: No  Follow up appointments reviewed: PCP Follow-up appointment confirmed?: Yes Date of PCP follow-up appointment?: 06/22/23 Follow-up Provider: Rema Fendt, NP  PCP (follow-yp appointment with Pharmarcist on 06/09/2023) Specialist Hospital Follow-up appointment confirmed?: No Reason Specialist Follow-Up Not Confirmed: Patient has Specialist Provider Number and  will Call for Appointment Do you need transportation to your follow-up appointment?: No Do you understand care options if your condition(s) worsen?: Yes-patient verbalized understanding    SIGNATURE: Elsie Lincoln. RN

## 2023-06-08 ENCOUNTER — Telehealth: Payer: Self-pay | Admitting: Family

## 2023-06-08 NOTE — Telephone Encounter (Signed)
Matthew Jennings

## 2023-06-09 ENCOUNTER — Encounter: Payer: Self-pay | Admitting: Pharmacist

## 2023-06-09 ENCOUNTER — Ambulatory Visit: Payer: Medicaid Other | Attending: Family | Admitting: Pharmacist

## 2023-06-09 DIAGNOSIS — R200 Anesthesia of skin: Secondary | ICD-10-CM

## 2023-06-09 DIAGNOSIS — R531 Weakness: Secondary | ICD-10-CM

## 2023-06-09 DIAGNOSIS — Z7984 Long term (current) use of oral hypoglycemic drugs: Secondary | ICD-10-CM

## 2023-06-09 DIAGNOSIS — E119 Type 2 diabetes mellitus without complications: Secondary | ICD-10-CM

## 2023-06-09 MED ORDER — EMPAGLIFLOZIN 25 MG PO TABS: 25.0000 mg | ORAL_TABLET | Freq: Every day | ORAL | 0 refills | Status: DC

## 2023-06-09 MED ORDER — AMITRIPTYLINE HCL 25 MG PO TABS: 25.0000 mg | ORAL_TABLET | Freq: Every day | ORAL | 1 refills | Status: DC

## 2023-06-09 MED ORDER — AMITRIPTYLINE HCL 100 MG PO TABS: 100.0000 mg | ORAL_TABLET | Freq: Every day | ORAL | 1 refills | Status: DC

## 2023-06-09 MED ORDER — SITAGLIPTIN PHOSPHATE 50 MG PO TABS: 50.0000 mg | ORAL_TABLET | Freq: Every day | ORAL | 1 refills | Status: DC

## 2023-06-09 NOTE — Progress Notes (Cosign Needed)
    S:    PCP: Rema Fendt, NP No chief complaint on file.  Patient arrives in good spirits. Presents for diabetes evaluation, education, and management. Patient was referred and last seen by Primary Care Provider on 04/27/2023. A1c at that visit was 8.7. He has his A1c checked again prior to his recent surgery on 06/02/2023 and it was up to 9.3%.   Patient's diabetes is longstanding. Today, he reports doing well. Patient restarting his medications last week after surgery. Denies side effects.   Family/Social History:  FH: no pertinent positives Tobacco: never smoker Alcohol: occasional use   Insurance coverage/medication affordability: self-pay  Current diabetes medications include: Jardiance 5mg  daily, Januvia 50 mg daily (takes 2 of the 25mg  daily) -Intolerance to Ozempic - endorses too much weight loss.  -Reports constipation and bloating with metformin.  Patient denies nocturia.  Patient denies neuropathy (nerve pain). Patient denies visual changes. Patient reports self foot exams.     Patient reported home blood sugars: none recently   Exercise habits: yard work, house work   O:   Human resources officer Value Date   HGBA1C 9.3 (H) 06/02/2023   There were no vitals filed for this visit.  Lipid Panel     Component Value Date/Time   CHOL 174 02/02/2023 0951   TRIG 109 02/02/2023 0951   HDL 53 02/02/2023 0951   CHOLHDL 3.3 02/02/2023 0951   LDLCALC 101 (H) 02/02/2023 0951    Clinical Atherosclerotic Cardiovascular Disease (ASCVD): No  The ASCVD Risk score (Arnett DK, et al., 2019) failed to calculate for the following reasons:   Risk score cannot be calculated because patient has a medical history suggesting prior/existing ASCVD   Patient is participating a Managed Medicaid plan: YES   A/P: Diabetes longstanding. A1c is 9.3% and patient attributes this to holding medications prior to recent surgery. Patient is able to verbalize appropriate hypoglycemia  management plan but is asymptomatic today. Medication adherence appears optimal.   -Continue current regimen. Refills given.  -His A1c and other characteristics qualifies him for LIBERATE. He does not have his phone with him today but agrees to return in 1 month to consider enrollment.  -Extensively discussed pathophysiology of diabetes, recommended lifestyle interventions, dietary effects on blood sugar control -Counseled on s/sx of and management of hypoglycemia -Next A1C anticipated 08/2023.   Written patient instructions provided.  Total time in face to face counseling 30 minutes.   Follow up with w/ me in 1 month. Amy on 06/22/2023.     Butch Penny, PharmD, P[place]y [place], CPP Clinical Pharmacist [facility] [phone]

## 2023-06-22 ENCOUNTER — Encounter: Payer: Self-pay | Admitting: Family

## 2023-06-22 ENCOUNTER — Ambulatory Visit (INDEPENDENT_AMBULATORY_CARE_PROVIDER_SITE_OTHER): Payer: Medicaid Other | Admitting: Family

## 2023-06-22 VITALS — BP 146/86 | HR 69 | Temp 98.0°F | Ht 71.0 in | Wt 161.6 lb

## 2023-06-22 DIAGNOSIS — Z09 Encounter for follow-up examination after completed treatment for conditions other than malignant neoplasm: Secondary | ICD-10-CM | POA: Diagnosis not present

## 2023-06-22 DIAGNOSIS — Z7984 Long term (current) use of oral hypoglycemic drugs: Secondary | ICD-10-CM | POA: Diagnosis not present

## 2023-06-22 DIAGNOSIS — I10 Essential (primary) hypertension: Secondary | ICD-10-CM | POA: Diagnosis not present

## 2023-06-22 DIAGNOSIS — E1165 Type 2 diabetes mellitus with hyperglycemia: Secondary | ICD-10-CM | POA: Diagnosis not present

## 2023-06-22 DIAGNOSIS — M4802 Spinal stenosis, cervical region: Secondary | ICD-10-CM

## 2023-06-22 DIAGNOSIS — E119 Type 2 diabetes mellitus without complications: Secondary | ICD-10-CM

## 2023-06-22 NOTE — Progress Notes (Signed)
 Patient states no real concerns to discuss,

## 2023-06-22 NOTE — Progress Notes (Signed)
 TRANSITION OF CARE VISIT    Date of Admission: 06/02/2023  Date of Discharge: 06/03/2023  Transitions of Care Call: 06/05/2023  Discharged from: [facility]   Discharge Diagnosis:  Cervical spinal stenosis [M48.02]   Summary of Admission per MD note:  Discharged Condition: Stable   Hospital Course:  Matthew Jennings is a 58 y.o. male who was admitted following an uncomplicated ACDF C4-7 and a L cubital tunnel release. They were recovered in PACU and transferred to the floor. Hospital course was uncomplicated. Pt stable for discharge today. Pt to f/u in office for routine post op visit. Pt is in agreement w/ plan.   Follow-Ups: Call Van Gelinas, MD (Neurosurgery); As needed, If symptoms worsen   Today's visit 06/22/2023: Since hospital discharge established with Neurosurgery. States several surgeries are pending with Neurosurgery. Doing well on Jardiance  and Januvia . Denies red flag symptoms associated with diabetes. Reports he has upcoming appointment with clinical pharmacist for diabetes follow-up. States in the past when he was taking high blood pressure medications it caused migraines which resolved once he quit taking. He does not complain of red flag symptoms such as but not limited to chest pain, shortness of breath, worst headache of life, nausea/vomiting.   No further issues/concerns for discussion today.   Patient/Caregiver self-reported problems/concerns: see above  MEDICATIONS  Medication Reconciliation conducted with patient/caregiver? (Yes/ No): Yes  New medications prescribed/discontinued upon discharge? (Yes/No): Yes  Barriers identified related to medications: No  LABS  Lab Reviewed (Yes/No/NA): Yes  PHYSICAL EXAM:  Today's Vitals   06/22/23 1315  BP: (!) 146/86  Pulse: 69  Temp: 98 F (36.7 C)  TempSrc: Oral  SpO2: 98%  Weight: 161 lb 9.6 oz (73.3 kg)  Height: 5\' 11"  (1.803 m)   Body  mass index is 22.54 kg/m.  Physical Exam HENT:     Head: Normocephalic and atraumatic.     Nose: Nose normal.     Mouth/Throat:     Mouth: Mucous membranes are moist.     Pharynx: Oropharynx is clear.  Eyes:     Extraocular Movements: Extraocular movements intact.     Conjunctiva/sclera: Conjunctivae normal.     Pupils: Pupils are equal, round, and reactive to light.  Cardiovascular:     Rate and Rhythm: Normal rate and regular rhythm.     Pulses: Normal pulses.     Heart sounds: Normal heart sounds.  Pulmonary:     Effort: Pulmonary effort is normal.     Breath sounds: Normal breath sounds.  Musculoskeletal:        General: Normal range of motion.     Cervical back: Normal range of motion and neck supple.  Neurological:     General: No focal deficit present.     Mental Status: He is alert and oriented to person, place, and time.  Psychiatric:        Mood and Affect: Mood normal.        Behavior: Behavior normal.     ASSESSMENT AND PLAN: 1. Hospital discharge follow-up (Primary) - Reviewed hospital course, current medications, ensured proper follow-up in place, and addressed concerns.   2. Cervical spinal stenosis - Keep all scheduled appointments with Neurosurgery.  3. Primary hypertension - Blood pressure not at goal during today's visit. Patient asymptomatic without chest pressure, chest pain, palpitations, shortness of breath, worst headache of life, and any additional red flag symptoms. - Patient declined pharmacological therapy.  - Referral to Cardiology for evaluation/management. - Follow-up with  primary provider as scheduled.  - Ambulatory referral to Cardiology  4. Type 2 diabetes mellitus with hyperglycemia, without long-term current use of insulin  (HCC) - Hemoglobin A1c 9.3% on 06/02/2023. - Continue present management.  - Routine screening.  - Discussed the importance of healthy eating habits, low-carbohydrate diet, low-sugar diet, regular aerobic  exercise (at least 150 minutes a week as tolerated) and medication compliance to achieve or maintain control of diabetes. - Keep upcoming appointment with clinical pharmacist.  - Follow-up with primary provider as scheduled. - Microalbumin / creatinine urine ratio  5. Diabetic eye exam [facility]) - Referral to Ophthalmology for evaluation/management. - Ambulatory referral to Ophthalmology  6. Encounter for diabetic foot exam [facility]) - Referral to Podiatry for evaluation/management. - Ambulatory referral to Podiatry   PATIENT EDUCATION PROVIDED: See AVS   FOLLOW-UP (Include any further testing or referrals):  - Keep all scheduled appointments with Neurosurgery. - Keep all scheduled appointments with clinical pharmacist. - Referral to Cardiology. - Follow-up with primary provider as scheduled.   Patient was given clear instructions to go to Emergency Department or return to medical center if symptoms don't improve, worsen, or new problems develop.The patient verbalized understanding.

## 2023-06-23 ENCOUNTER — Encounter: Payer: Self-pay | Admitting: Family

## 2023-06-23 LAB — MICROALBUMIN / CREATININE URINE RATIO
Creatinine, Urine: 78.6 mg/dL
Microalb/Creat Ratio: 38 mg/g{creat} — ABNORMAL HIGH (ref 0–29)
Microalbumin, Urine: 29.7 ug/mL

## 2023-07-12 NOTE — Progress Notes (Deleted)
    S:    PCP: Rema Fendt, NP No chief complaint on file.  Patient arrives in good spirits. Presents for diabetes evaluation, education, and management. Patient was referred and last seen by Primary Care Provider on 06/22/23. A1c on 06/02/23 prior to surgery worsened from 8.3% to 9.7%. Patient declined pharmacological therapy for HTN at last PCP appt. At last pharmacy appt on 06/09/23 patient was interested in the LIBERATE study.   PMH includes T2D, Bell's palsy, HTN, HLD, stroke (2020)  Patient's diabetes is longstanding. Today, he reports doing well. Patient restarting his medications last week after surgery. Denies side effects. ***  Family/Social History:  FH: no pertinent positives Tobacco: never smoker Alcohol: occasional use   Insurance coverage/medication affordability: self-pay  Current diabetes medications include: Jardiance 25 mg daily, Januvia 50 mg daily -Intolerance to Ozempic 2 mg - endorses too much weight loss.  -Reports constipation and bloating with metformin IR ***. Trulicity?***  Current hyperlipidemia medications: atorvastatin 40 mg (last filled 90 ds on 02/02/23)  Patient denies nocturia. *** Patient denies neuropathy (nerve pain). Patient denies visual changes. Patient reports self foot exams.     Patient reported home blood sugars: none recently ***  Exercise habits: yard work, house work ***  O:   Lab Results  Component Value Date   HGBA1C 9.3 (H) 06/02/2023   There were no vitals filed for this visit.  BP Readings from Last 3 Encounters:  06/22/23 (!) 146/86  06/03/23 117/74  05/26/23 (!) 146/85   UACR 38 mg/g on 06/22/23  Lipid Panel     Component Value Date/Time   CHOL 174 02/02/2023 0951   TRIG 109 02/02/2023 0951   HDL 53 02/02/2023 0951   CHOLHDL 3.3 02/02/2023 0951   LDLCALC 101 (H) 02/02/2023 0951    Clinical Atherosclerotic Cardiovascular Disease (ASCVD): Yes  The ASCVD Risk score (Arnett DK, et al., 2019) failed to  calculate for the following reasons:   Risk score cannot be calculated because patient has a medical history suggesting prior/existing ASCVD   Patient is participating a Managed Medicaid plan: YES   Revisit lower doses of GLP1s? Why did he stop Trulicity? Retrial of XR metformin? Need to restart statin with possible stroke hx? HTN treatment?   A/P: Diabetes longstanding. A1c is 9.3% and patient attributes this to holding medications prior to recent surgery. Patient is able to verbalize appropriate hypoglycemia management plan but is asymptomatic today. Medication adherence appears optimal.   -Continue current regimen. Refills given.  -His A1c and other characteristics qualifies him for LIBERATE. He does not have his phone with him today but agrees to return in 1 month to consider enrollment.  -Extensively discussed pathophysiology of diabetes, recommended lifestyle interventions, dietary effects on blood sugar control -Counseled on s/sx of and management of hypoglycemia -Next A1C anticipated 08/2023.   Written patient instructions provided.  Total time in face to face counseling 30 minutes.    Pharmacist: 1 mo*** PCP: 07/23/23  Nils Pyle, PharmD PGY1 Pharmacy Resident  Butch Penny, PharmD, BCACP, CPP Clinical Pharmacist [facility] [phone]

## 2023-07-13 ENCOUNTER — Telehealth: Payer: Self-pay | Admitting: Adult Health

## 2023-07-13 ENCOUNTER — Ambulatory Visit: Payer: No Typology Code available for payment source | Admitting: Pharmacist

## 2023-07-13 NOTE — Telephone Encounter (Signed)
 Patient called to verify appointment, patient said appointment scheduled on 08/03/23 is not showing on his MyChart.

## 2023-07-19 NOTE — Progress Notes (Cosign Needed Addendum)
 S:     Chief Complaint  Patient presents with   Diabetes   58 y.o. male who presents for diabetes evaluation, education, and management in the context of the LIBERATE Study.   Patient arrives in good spirits. Presents for diabetes evaluation, education, and management. Patient was referred and last seen by Primary Care Provider on 06/22/23. A1c on 06/02/23 prior to surgery (cervical spine stenosis) worsened from 8.3% to 9.7%. Patient declined pharmacological therapy for HTN at last PCP appt. At last pharmacy appt on 06/09/23 patient was interested in the LIBERATE study.   PMH includes T2D, Bell's palsy, HTN, HLD, stroke (2020)  Patient's diabetes is longstanding. Today, he reports doing well. The only medications he is taking are [place] and Januvia. He has not restarted his statin since sugery but plans to discuss restarting this with his PCP later this week. Reports that he did not take any medications after surgery due to feeling nauseous. Denies side effects. He is interested in enrolling in the LIBERATE study today and has his phone with him. He does express some concern with the device staying on as he moves around a lot in his sleep. Patient reports that he lost 40 lbs on Ozempic leading to discontinuation. He reports that he previously tolerated Trulicity well and only stopped taking it because he was no longer able to get the medication via PAP. He is agreeable to retrialing Trulicity now that he has [employer].   Family/Social History:  FH: no pertinent positives Tobacco: never smoker Alcohol: occasional use   Insurance coverage/medication affordability: Managed Medicaid - [employer]  Current diabetes medications include: Jardiance 25 mg daily, Januvia 50 mg daily -Intolerance to Ozempic 2 mg - endorses too much weight loss.  -Reports constipation and bloating with metformin IR.  Current hyperlipidemia medications: atorvastatin 40 mg (last filled 90 ds on  02/02/23) - reported this was stopped after his neurosurgery procedure, but he will discuss restarting with Amy on 07/23/23  Patient denies nocturia.  Patient endorses neuropathy (nerve pain) - numbness in lower extremity, in the midst of neurosurgery procedures Patient endorses stable visual changes- blurry vision Patient endorses polyphagia. Patient reports self foot exams.     Patient reported home blood sugars: FBG today: 176, usually 210-220  Patient reported dietary habits: Reports that he recently reduced intake of sugary fruits.  Exercise habits: yard work, house work   CGM Study Consent: Yes Study visit: Initial Visit  Diabetes Distress Scale Feeling like diabetes is taking up too much of my mental and physical energy every day.: Not a problem Feeling that my doctor doesn't know enough about diabetes and diabetic care. : Not a problem Feeling angry, scared, and/or depressed when I think about living with diabetes : A slight problem Feeling that my doctor doesn't give my clear directions on how to manage my diabetes. : Not a problem Feeling that im not testing my blood sugars frequently enough.: Not a problem Feeling that I'm often failing with my diabetes routine: A moderate problem Feelling that friends and family are not supportive enough of self care efforts.: Not a problem Feeling that diabetes controls my life.: A slight problem Feeling that my doctor doesn't take my concerns seriously enough: Not a problem Not feeling confident in my day to day ability to manage diabetes: A slight problem Feeling that I will end up with serious long term complications no matter what I do.: A slight problem Feeling that I am not sticking closely enough to  a good meal plan.: A moderate problem Feeling that friends or family don't appreciate how difficult living with diabetes can be. : Not a problem Feeling overwhelmed by the demands of living with diabetes.: A slight problem Feeling that I  don't have a doctor who I can see.: Not a problem Not feeling motivated to keep up my diabetes self management.: A slight problem Feeling that friends or family don't give me the emotional support that I would like. : Not a problem DDS17 Score: 27 Emotional Burden Score: 1.8 Physician related distress score: 1 Regimen Related Distress score : 2.2 Interpersonal distress score: 1    O:    Lab Results  Component Value Date   HGBA1C 8.0 (A) 07/20/2023    POC A1c Today: 8.0  There were no vitals filed for this visit.   Lipid Panel     Component Value Date/Time   CHOL 174 02/02/2023 0951   TRIG 109 02/02/2023 0951   HDL 53 02/02/2023 0951   CHOLHDL 3.3 02/02/2023 0951   LDLCALC 101 (H) 02/02/2023 0951    Clinical Atherosclerotic Cardiovascular Disease (ASCVD): Yes  The ASCVD Risk score (Arnett DK, et al., 2019) failed to calculate for the following reasons:   Risk score cannot be calculated because patient has a medical history suggesting prior/existing ASCVD   Patient is participating in a Managed Medicaid Plan:  Yes   A/P:  LIBERATE Study:  -Patient provided verbal consent to participate in the study. Consent documented in electronic medical record.  -Provided education on Libre 3 CGM. Collaborated to ensure Josephine Igo 3 app was downloaded on patient's phone. Educated on how to place sensor every 14 days, patient placed first sensor correctly and verbalized understanding of use, removal, and how to place next sensor. Discussed alarms. 6 sensors provided for a 3 month supply. Educated to contact the office if the sensor falls off early and replacements are needed before their next [employer].   Diabetes longstanding. A1c of 8.0% is uncontrolled above goal < 7.0%, but improved from 9.3% since restarting medications post-surgery and dietary changes. Patient agreeable to transition from Januvia to Trulicity for improved glycemic control. Will monitor for undesirable weight  loss. Patient is able to verbalize appropriate hypoglycemia management plan but is asymptomatic today. Medication adherence appears optimal.   - STOP Januvia - START Trulicity 0.75 mg injected into the skin weekly. Submitted PA Key: [id number]. - Continue Jardiance 25 mg daily - Educated on [facility] sensor application and usage. Connected to clinic LibreView. Recommend to monitor blood glucose continuously.  - Extensively discussed pathophysiology of diabetes, recommended lifestyle interventions, dietary effects on blood sugar control - Counseled on s/sx of and management of hypoglycemia - Next A1C anticipated 10/2023  Hyperlipidemia/ASCVD Risk Reduction: - Currently uncontrolled with LDL-C above goal < 55 mg/dL given premature ASCVD. Encouraged patient to restart statin - he prefers to touch base with PCP this week before picking up medication. High intensity statin indicated - Reviewed long term complications of uncontrolled cholesterol - Recommend to restart atorvastatin 40 mg daily  - Discuss ASA 81 mg daily at PCP appointment given hx of CVA  Written patient instructions provided.  Total time in face to face counseling 45 minutes.    Pharmacist: 09/07/23 PCP: 07/23/23  Nils Pyle, PharmD PGY1 Pharmacy Resident  Butch Penny, PharmD, BCACP, CPP Clinical Pharmacist [facility] [phone]

## 2023-07-20 ENCOUNTER — Other Ambulatory Visit: Payer: Self-pay

## 2023-07-20 ENCOUNTER — Other Ambulatory Visit (HOSPITAL_COMMUNITY): Payer: Self-pay

## 2023-07-20 ENCOUNTER — Ambulatory Visit: Attending: Family | Admitting: Pharmacist

## 2023-07-20 DIAGNOSIS — E119 Type 2 diabetes mellitus without complications: Secondary | ICD-10-CM | POA: Diagnosis not present

## 2023-07-20 DIAGNOSIS — Z7985 Long-term (current) use of injectable non-insulin antidiabetic drugs: Secondary | ICD-10-CM | POA: Diagnosis not present

## 2023-07-20 DIAGNOSIS — E785 Hyperlipidemia, unspecified: Secondary | ICD-10-CM | POA: Diagnosis not present

## 2023-07-20 DIAGNOSIS — Z7984 Long term (current) use of oral hypoglycemic drugs: Secondary | ICD-10-CM | POA: Diagnosis not present

## 2023-07-20 LAB — POCT GLYCOSYLATED HEMOGLOBIN (HGB A1C): HbA1c, POC (controlled diabetic range): 8 % — AB (ref 0.0–7.0)

## 2023-07-20 MED ORDER — TRULICITY 0.75 MG/0.5ML ~~LOC~~ SOAJ: 0.7500 mg | SUBCUTANEOUS | 3 refills | Status: DC

## 2023-07-20 NOTE — Patient Instructions (Addendum)
 Your password for [employer] App is [email or web address]!  Medication changes - STOP Januvia - START Trulicity 0.75 mg once weekly - Continue Jardiance  Talk with Amy Zonia Kief about restarting your cholesterol medication, atorvastatin

## 2023-07-22 ENCOUNTER — Telehealth: Payer: Self-pay

## 2023-07-22 NOTE — Telephone Encounter (Signed)
 Pharmacy Patient Advocate Encounter  Received notification from [facility] MEDICAID that Prior Authorization for TRULICITY has been APPROVED from 07/20/2023 to 07/19/2024   PA #/Case ID/Reference #: [id number]  PHARMACY HAS READY FOR PICK UP $4 COPAY. PATIENT IS AWARE.

## 2023-07-23 ENCOUNTER — Ambulatory Visit (INDEPENDENT_AMBULATORY_CARE_PROVIDER_SITE_OTHER): Payer: No Typology Code available for payment source | Admitting: Family

## 2023-07-23 ENCOUNTER — Encounter: Payer: Self-pay | Admitting: Family

## 2023-07-23 VITALS — BP 132/81 | HR 83 | Temp 98.6°F | Ht 71.0 in | Wt 165.8 lb

## 2023-07-23 DIAGNOSIS — E785 Hyperlipidemia, unspecified: Secondary | ICD-10-CM | POA: Diagnosis not present

## 2023-07-23 DIAGNOSIS — E119 Type 2 diabetes mellitus without complications: Secondary | ICD-10-CM | POA: Diagnosis not present

## 2023-07-23 MED ORDER — ATORVASTATIN CALCIUM 40 MG PO TABS: 40.0000 mg | ORAL_TABLET | Freq: Every day | ORAL | 0 refills | Status: DC

## 2023-07-23 NOTE — Progress Notes (Signed)
 Patient states he has a Therapist, art and just had to have an A1C done on Monday to make sure it worked.   Patient states they took him off blood thinner medication and cholesterol and no one ever told him to go back on it.

## 2023-07-23 NOTE — Progress Notes (Signed)
 Patient ID: Matthew Jennings, male    DOB: Jul 03, 1965  MRN: [id number]  CC: Chronic Conditions Follow-Up  Subjective: Peace Jost is a 58 y.o. male who presents for chronic conditions follow-up.   His concerns today include:  - Recent appointment on 07/20/2023 at [facility] [place] [facility] with clinical pharmacist for diabetes management. Hemoglobin A1c 8.0% on 07/20/2023. Doing well on regimen, no issues/concerns. Denies red flag symptoms associated with diabetes. States he feels like anything he eats causes his blood sugars to increase. He declines referral to nutritionist. States he has an upcoming follow-up appointment with clinical pharmacist. - Doing well on Atorvastatin, no issues/concerns. He declines cholesterol lab to be checked today.  - States Cardiology called him to schedule an appointment and he hasn't called them back as of present. Blood pressure normal today in office. - States he plans to ask Neurosurgery/Neurology on next week if he should begin taking blood thinners again.  - No further issues/concerns for discussion today.    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 2 mL 3   empagliflozin (JARDIANCE) 25 MG TABS tablet Take 1 tablet (25 mg total) by mouth daily  before breakfast. 90 tablet 0   amitriptyline (ELAVIL) 100 MG tablet Take 1 tablet (100 mg total) by mouth at bedtime. (Patient not taking: Reported on 07/23/2023) 90 tablet 1   amitriptyline (ELAVIL) 25 MG tablet Take 1 tablet (25 mg total) by mouth at bedtime. Take in addition to 100mg  tablet for total of 125mg  nightly (Patient not taking: Reported on 07/23/2023) 90 tablet 1   methocarbamol (ROBAXIN) 500 MG tablet Take 1 tablet (500 mg total) by mouth every 6 (six) hours as needed for muscle spasms. (Patient not taking: Reported on 07/23/2023) 120 tablet 0   oxyCODONE 10 MG TABS Take 1 tablet (10 mg total) by mouth every 4 (four) hours as needed for severe pain (pain score 7-10). (Patient not taking: Reported on 07/23/2023) 30 tablet 0   No current facility-administered medications on file prior to visit.    Allergies  Allergen Reactions   Penicillins Anaphylaxis   Aspirin Other (See Comments)    Lyme Disease     Social History   Socioeconomic History   Marital status: Single    Spouse name: Not on file   Number of children: 0   Years of education: Not on file   Highest education level: Associate degree: occupational, Scientist, product/process development, or vocational program  Occupational History    Comment: EMT  Tobacco Use   Smoking status: Never    Passive exposure: Yes   Smokeless tobacco: Never  Vaping Use   Vaping status: Never Used  Substance and Sexual Activity   Alcohol use: Yes    Comment: occ   Drug use: Not  Currently   Sexual activity: Not Currently  Other Topics Concern   Not on file  Social History Narrative   Lives alone   Social Drivers of Health   Financial Resource Strain: High Risk (03/23/2023)   Overall Financial Resource Strain (CARDIA)    Difficulty of Paying Living Expenses: Very hard  Food Insecurity: Food Insecurity Present (03/23/2023)   Hunger Vital Sign    Worried About Running Out of Food in the Last Year: Often true    Ran Out of Food in the Last Year: Often true   Transportation Needs: No Transportation Needs (03/23/2023)   PRAPARE - Administrator, Civil Service (Medical): No    Lack of Transportation (Non-Medical): No  Physical Activity: Sufficiently Active (03/23/2023)   Exercise Vital Sign    Days of Exercise per Week: 5 days    Minutes of Exercise per Session: 30 min  Stress: Stress Concern Present (03/23/2023)   [employer] of Occupational Health - Occupational Stress Questionnaire    Feeling of Stress : Very much  Social Connections: Socially Isolated (03/23/2023)   Social Connection and Isolation Panel [NHANES]    Frequency of Communication with Friends and Family: More than three times a week    Frequency of Social Gatherings with Friends and Family: Three times a week    Attends Religious Services: Never    Active Member of Clubs or Organizations: No    Attends Banker Meetings: Never    Marital Status: Never married  Intimate Partner Violence: Not At Risk (03/23/2023)   Humiliation, Afraid, Rape, and Kick questionnaire    Fear of Current or Ex-Partner: No    Emotionally Abused: No    Physically Abused: No    Sexually Abused: No    Family History  Problem Relation Age of Onset   Cancer Mother    Alzheimer's disease Father     Past Surgical History:  Procedure Laterality Date   ANTERIOR CERVICAL DECOMP/DISCECTOMY FUSION N/A 06/02/2023   Procedure: CERVICAL FOUR-FIVE, CERVICAL FIVE-SIX, CERVICAL SIX-SEVEN ANTERIOR CERVICAL DECOMPRESSION/DISCECTOMY FUSION;  Surgeon: Bedelia Person, MD;  Location: [facility] OR;  Service: Neurosurgery;  Laterality: N/A;   KNEE RECONSTRUCTION Left    58 years old   ULNAR NERVE TRANSPOSITION Left 06/02/2023   Procedure: ULNAR NERVE DECOMPRESSION/TRANSPOSITION;  Surgeon: Bedelia Person, MD;  Location: [facility] OR;  Service: Neurosurgery;  Laterality: Left;   VENTRAL HERNIA REPAIR     As a newborn    ROS: Review of Systems Negative except as stated above  PHYSICAL  EXAM: BP 132/81   Pulse 83   Temp 98.6 F (37 C) (Oral)   Ht 5\' 11"  (1.803 m)   Wt 165 lb 12.8 oz (75.2 kg)   SpO2 96%   BMI 23.12 kg/m   Physical Exam HENT:     Head: Normocephalic and atraumatic.     Nose: Nose normal.     Mouth/Throat:     Mouth: Mucous membranes are moist.     Pharynx: Oropharynx is clear.  Eyes:     Extraocular Movements: Extraocular movements intact.     Conjunctiva/sclera: Conjunctivae normal.     Pupils: Pupils are equal, round, and reactive to light.  Cardiovascular:     Rate and Rhythm: Normal rate and regular rhythm.     Pulses: Normal pulses.     Heart sounds: Normal heart sounds.  Pulmonary:     Effort: Pulmonary effort is normal.     Breath sounds: Normal breath  sounds.  Musculoskeletal:        General: Normal range of motion.     Cervical back: Normal range of motion and neck supple.  Neurological:     General: No focal deficit present.     Mental Status: He is alert and oriented to person, place, and time.  Psychiatric:        Mood and Affect: Mood normal.        Behavior: Behavior normal.     ASSESSMENT AND PLAN: 1. Hyperlipidemia, unspecified hyperlipidemia type (Primary) - Continue Atorvastatin as prescribed. Counseled on medication adherence/adverse effects.  - Patient declined lipid panel.  - Follow-up with primary provider as scheduled.  - atorvastatin (LIPITOR) 40 MG tablet; Take 1 tablet (40 mg total) by mouth daily.  Dispense: 90 tablet; Refill: 0  2. Type 2 diabetes mellitus without complication, unspecified whether long term insulin use (HCC) - Continue present management.  - Discussed the importance of healthy eating habits, low-carbohydrate diet, low-sugar diet, regular aerobic exercise (at least 150 minutes a week as tolerated) and medication compliance to achieve or maintain control of diabetes. - Keep all scheduled appointments with clinical pharmacist.  - Follow-up with primary provider as scheduled.   Patient  was given the opportunity to ask questions.  Patient verbalized understanding of the plan and was able to repeat key elements of the plan. Patient was given clear instructions to go to Emergency Department or return to medical center if symptoms don't improve, worsen, or new problems develop.The patient verbalized understanding.   Requested Prescriptions   Signed Prescriptions Disp Refills   atorvastatin (LIPITOR) 40 MG tablet 90 tablet 0    Sig: Take 1 tablet (40 mg total) by mouth daily.    Follow-up with primary provider as scheduled.  Rema Fendt, NP

## 2023-07-27 DIAGNOSIS — M4802 Spinal stenosis, cervical region: Secondary | ICD-10-CM | POA: Diagnosis not present

## 2023-07-27 DIAGNOSIS — G5621 Lesion of ulnar nerve, right upper limb: Secondary | ICD-10-CM | POA: Diagnosis not present

## 2023-07-30 NOTE — Progress Notes (Deleted)
 [facility]  PATIENT: Matthew Jennings DOB: 27-May-1965  REFERRING CLINICIAN: Rema Fendt, NP HISTORY FROM: patient  REASON FOR VISIT: Bell's Palsy, left-sided numbness   HISTORICAL  CHIEF COMPLAINT:  No chief complaint on file.   HISTORY OF PRESENT ILLNESS:     Update 08/03/2023 JM: Patient returns for 50-month follow-up.    S/p ACDF C4-7 and L cubital tunnel release 05/2023 with Dr. Maisie Fus  Migraines remain well-controlled on amitriptyline 100 mg nightly, use of naratriptan ***     History provided for reference purposes only Update 01/26/2023 JM: Patient returns for follow-up visit.  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  Referral placed to orthopedic and neurosurgery.  Reports continued left-sided weakness gradually worsening and now affecting right side. Reports more recently after resting right arm on table at chest height, will start to have pain in shoulder that radiates to ear and jaw. Reports worked with PT with some improvement but after stopping, symptoms returned and has been gradually worsening.  Recently had follow-up with neurosurgery Dr. Maisie Fus, OV note reviewed, due to failure of medical and physical therapy and progressive nature of symptoms resulting in functional loss, recommended pursuing surgical intervention in form of C4-5, C5-6, C6-7 ACDF as well as left cubital tunnel release and likely need to consider right cubital tunnel release in the future.  Patient questions pursuing surgery at this point, concerned that he was only seen by Dr. Maisie Fus twice and had  no further testing or imaging prior to recommending surgical intervention.   Reports continued migraine headaches, about 2-3 per month, usually has at least 1 more severe migraine.  Remains on amitriptyline 100 mg nightly for both migraine prophylaxis and neuropathic pain. Use of naratriptan but does cause fatigue, usually falls asleep and will wake up without migraine. C/o difficult time sleeping due to pain and unable to get comfortable.   Denies any other neurological or new stroke symptoms.  Remains on Plavix and atorvastatin.  Routinely follows with PCP for stroke risk factor management.  Update 06/02/2022 JM: Patient returns for 88-month follow-up.  He continues to experience left-sided numbness and weakness which has been gradually worsening since prior visit. Does have mild RLE numbness distally but this has been stable. He has difficulty holding or picking up objects and has been having more difficulty with his left leg giving out at times while ambulating, he does questions some weakness in LLE. Denies lower back pain. Has had difficulty sleeping due to pain especially in his neck and left shoulder. Has been previously referred to neurosurgery and orthopedics for cervical radiculopathy with difficulty finding office that will accept his insurance but has been recently approved for Medicaid.  He continues to apply for disability.  Migraines have improved since prior visit, about 3 migraines per month. Current use of amitriptyline 75 mg nightly. Was unable to try naratriptan due to co-pay.  He is interested in trying that he has Medicaid.  Denies any new stroke or neurological symptoms.  Remains on Plavix and atorvastatin.  Blood pressure well-controlled.  Has not had recent follow-up with PCP.  Update 11/25/2021 JM: Patient returns for 30-month follow-up unaccompanied.  Left arm weakness/numbness persist. Continued difficulty  with using left hand. Does have left shoulder pain. Reports  numbness/pain especially from elbow to outer hand. Continued neck pain. Can have difficulty sleeping at night.  Completed MR cervical spine which showed severe bilateral foraminal stenosis and disc bulging at C5-6 and C6-7, referral placed to neurosurgery but per patient, refused to see patient due to being covered under [facility].   Continued left leg numbness and imbalance. Continued b/l foot numbness.   Migraines still 2-3x per week but severe migraine about 1x per week which can be debilitating, did see much benefit after increasing amitriptyline dosage at prior visit. He does take nightly but has also been taking as needed for acute management in addition to naproxen. Migraines typically resolve after 30 to 60 minutes. Previously prescribed naratriptan as intolerance to rizatriptan and sumatriptan but reports he never received prescription.  Compliant on Plavix and atorvastatin. Denies any new stroke type symptoms. Blood pressure today 129/93.  Prior follow-up with PCP 6 months ago.  He is in the process of applying for Medicaid, reports needs to apply for disability first. Trying to collect all prior medical information as advised by social security.   MRI cervical spine 09/16/2021 IMPRESSION:  - At C6-7 disc bulging and uncovertebral joint hypertrophy with moderate spinal stenosis and severe bilateral foraminal stenosis. No cord signal changes. - At C5-6 disc bulging and uncovertebral joint hypertrophy with mild spinal stenosis and severe bilateral foraminal stenosis. No cord signal changes. - At C4-5 right ward disc bulging and uncovertebral joint hypertrophy with severe right foraminal stenosis.   UPDATE 08/26/2021 JM: Returns for 15-month follow-up unaccompanied.  Reports worsening of left hand weakness more so with digit 1 and 5, greater difficulty making a fist and dropping things, does have long standing hx of neck pain which has also been progressing, can radiate down in to his shoulder  and down the back side of his left arm, usually worse in the morning. Also reports continued imbalance and left leg numbness. Continues bilateral foot numbness, bottom of feet, stable. Continued migraines approximately 2-3x per week, currently on amitriptyline 25 mg nightly, will use Maxalt which can help take the edge off but unable to take during the day due to fatigue, will use naproxen during the day if needed with benefit if he takes soon enough. Does not overuse naproxen. 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 Hx of Bell's palsy, resolved.  Remains on Plavix and atorvastatin, denies side effects.  Blood pressure today 137/87.  A1c 9.0 (05/2021) with PCP making adjustments to diabetic regimen. No further concerns at this time.   UPDATE 02/18/2021 JM: Mr. Crabbe returns for 6 months follow-up.  No residual facial weakness or reoccurring of symptoms.  Continued left hand weakness with numbness (ring and pinky finger and occasional thumb) with difficulty picking up smaller objects and left leg numbness with leg giving out leading to imbalance.  No AD and no falls. Denies any associated pain such as in neck, lower back, hip or knee.  Also reports occasional slurred speech especially when trying to speak quicker.  All symptoms persistent since 05/2020.  Denies new stroke/TIA symptoms.  Reports improvement of bilateral foot numbness as glucose levels improved (most recent A1c 7.4 down from 10.4).  Migraine headaches improved since prior visit - only took 1 month of amitriptyline as he was not aware he had refills.  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 He has been doing  cooking at home (like he would at work) without difficulty.  Remains on atorvastatin 40 mg daily.  Blood pressure today 126/86.  No further concerns at this time.   Initial consult visit 08/20/2020 Dr. Marjory Lies: 58 year old male here for evaluation of Bell's palsy.  January 2022 patient had  onset of left facial weakness, went to the ER for evaluation.  He was also having left hand numbness at the time and had MRI of the brain which showed enhancement of the left facial nerve.  No cause for left hand numbness was found.  Patient followed up with ENT and Bell's palsy symptoms have improved.  Patient continues to have left hand numbness and weakness, mainly affecting digits 1 and 5.  Patient also has bilateral toe and feet numbness.  Patient has new diagnosis of diabetes, to started medications about 1 month ago.  A1c is greater than 11.  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  Patient has history of migraine since age 30 years old with global intense squeezing throbbing sensation, sensitive to light and sound, triggered by weather changes.  Having 3 headaches per week.  He has tried Aleve and Keppra in the past without relief.    REVIEW OF SYSTEMS: Full 14 system review of systems performed and negative with exception of: as per HPI.   ALLERGIES: Allergies  Allergen Reactions   Penicillins Anaphylaxis   Aspirin Other (See Comments)    Lyme Disease     HOME MEDICATIONS: Outpatient Medications Prior to Visit  Medication Sig Dispense Refill   amitriptyline (ELAVIL) 100 MG tablet Take 1 tablet (100 mg total) by mouth at bedtime. (Patient not taking: Reported on 07/23/2023) 90 tablet 1   amitriptyline (ELAVIL) 25 MG tablet Take 1 tablet (25 mg total) by mouth at bedtime. Take in addition to 100mg  tablet for total of 125mg  nightly (Patient not taking: Reported on 07/23/2023) 90 tablet 1   atorvastatin (LIPITOR) 40 MG tablet Take 1 tablet (40 mg total) by mouth daily. 90 tablet 0   Dulaglutide (TRULICITY) 0.75 MG/0.5ML SOAJ Inject 0.75 mg into the skin once a week. 2 mL 3   empagliflozin (JARDIANCE) 25 MG TABS tablet Take 1 tablet (25 mg total) by mouth daily before breakfast. 90 tablet 0   methocarbamol (ROBAXIN)  500 MG tablet Take 1 tablet (500 mg total) by mouth every 6 (six) hours as needed for muscle spasms. (Patient not taking: Reported on 07/23/2023) 120 tablet 0   oxyCODONE 10 MG TABS Take 1 tablet (10 mg total) by mouth every 4 (four) hours as needed for severe pain (pain score 7-10). (Patient not taking: Reported on 07/23/2023) 30 tablet 0   No facility-administered medications prior to visit.    PAST MEDICAL HISTORY: Past Medical History:  Diagnosis Date   Arthritis    Bell's palsy    left   DM (diabetes mellitus) (HCC)    Type 2   Facial paralysis/Bells palsy    Headache    Migraines   Hx of migraines    Stroke (HCC) 2020   Pt states it was a "Palsy episode but due to increase in symptoms, classified it as mild stroke" leaving pt with current spine/nerve/strength problems    PAST SURGICAL HISTORY: Past Surgical History:  Procedure Laterality Date   ANTERIOR CERVICAL DECOMP/DISCECTOMY FUSION N/A 06/02/2023   Procedure: CERVICAL FOUR-FIVE, CERVICAL FIVE-SIX, CERVICAL SIX-SEVEN ANTERIOR CERVICAL DECOMPRESSION/DISCECTOMY FUSION;  Surgeon: Bedelia Person,  MD;  Location: [facility] OR;  Service: Neurosurgery;  Laterality: N/A;   KNEE RECONSTRUCTION Left    58 years old   ULNAR NERVE TRANSPOSITION Left 06/02/2023   Procedure: ULNAR NERVE DECOMPRESSION/TRANSPOSITION;  Surgeon: Bedelia Person, MD;  Location: [facility] OR;  Service: Neurosurgery;  Laterality: Left;   VENTRAL HERNIA REPAIR     As a newborn    FAMILY HISTORY: Family History  Problem Relation Age of Onset   Cancer Mother    Alzheimer's disease Father     SOCIAL HISTORY: Social History   Socioeconomic History   Marital status: Single    Spouse name: Not on file   Number of children: 0   Years of education: Not on file   Highest education level: Associate degree: occupational, Scientist, product/process development, or vocational program  Occupational History    Comment: EMT  Tobacco Use   Smoking status: Never    Passive exposure: Yes    Smokeless tobacco: Never  Vaping Use   Vaping status: Never Used  Substance and Sexual Activity   Alcohol use: Yes    Comment: occ   Drug use: Not Currently   Sexual activity: Not Currently  Other Topics Concern   Not on file  Social History Narrative   Lives alone   Social Drivers of Health   Financial Resource Strain: High Risk (03/23/2023)   Overall Financial Resource Strain (CARDIA)    Difficulty of Paying Living Expenses: Very hard  Food Insecurity: Food Insecurity Present (03/23/2023)   Hunger Vital Sign    Worried About Running Out of Food in the Last Year: Often true    Ran Out of Food in the Last Year: Often true  Transportation Needs: No Transportation Needs (03/23/2023)   PRAPARE - Administrator, Civil Service (Medical): No    Lack of Transportation (Non-Medical): No  Physical Activity: Sufficiently Active (03/23/2023)   Exercise Vital Sign    Days of Exercise per Week: 5 days    Minutes of Exercise per Session: 30 min  Stress: Stress Concern Present (03/23/2023)   [employer] of Occupational Health - Occupational Stress Questionnaire    Feeling of Stress : Very much  Social Connections: Socially Isolated (03/23/2023)   Social Connection and Isolation Panel [NHANES]    Frequency of Communication with Friends and Family: More than three times a week    Frequency of Social Gatherings with Friends and Family: Three times a week    Attends Religious Services: Never    Active Member of Clubs or Organizations: No    Attends Banker Meetings: Never    Marital Status: Never married  Intimate Partner Violence: Not At Risk (03/23/2023)   Humiliation, Afraid, Rape, and Kick questionnaire    Fear of Current or Ex-Partner: No    Emotionally Abused: No    Physically Abused: No    Sexually Abused: No     PHYSICAL EXAM  GENERAL EXAM/CONSTITUTIONAL: Vitals:  There were no vitals filed for this visit.   There is no height or weight on  file to calculate BMI. Wt Readings from Last 3 Encounters:  07/23/23 165 lb 12.8 oz (75.2 kg)  06/22/23 161 lb 9.6 oz (73.3 kg)  06/02/23 170 lb (77.1 kg)   Patient is in no distress; very pleasant middle-age Caucasian male, well developed, nourished and groomed; neck is supple  CARDIOVASCULAR: Examination of carotid arteries is normal; no carotid bruits Regular rate and rhythm, no murmurs Examination of peripheral vascular system by observation  and palpation is normal  EYES: Ophthalmoscopic exam of optic discs and posterior segments is normal; no papilledema or hemorrhages  MUSCULOSKELETAL: Gait, strength, tone, movements noted in Neurologic exam below  NEUROLOGIC: MENTAL STATUS:  awake, alert, oriented to person, place and time recent and remote memory intact normal attention and concentration language fluent, comprehension intact, naming intact fund of knowledge appropriate  CRANIAL NERVE:  2nd - no papilledema on fundoscopic exam 2nd, 3rd, 4th, 6th - pupils equal and reactive to light, visual fields full to confrontation, extraocular muscles intact, no nystagmus 5th - facial sensation symmetric 7th - facial strength symmetric 8th - hearing intact 9th - palate elevates symmetrically, uvula midline 11th - shoulder shrug symmetric 12th - tongue protrusion midline  MOTOR:  LUE 4/5 WEAKNESS THROUGHOUT AND LLE 4+/5 HF AND ADF, MILD ATROPHY AND WEAKNESS LEFT HAND INTRINSIC MUSCLES. RUE 4+5, DIFFICULTY MAKING COMPLETE FIST. RLE 5/5  SENSORY:  normal symmetrically to vibration; DECREASED LIGHT TOUCH AND PP SENSATION RUE>LUE AND LLE AND DECR PP IN LEFT HAND, PIN/NEEDLE SENSATION LIGHT TOUCH BLE  COORDINATION:  finger-nose-finger mild incoordination of LUE, decreased RIGHT>LEFT fine finger movements  REFLEXES:  deep tendon reflexes TRACE and symmetric  GAIT/STATION:  narrow based gait with mild imbalance with decreased LLE stride length and step height without use of  assistive device. Able to stand from seated position with arms crossed.  Romberg positive     DIAGNOSTIC DATA (LABS, IMAGING, TESTING) - I reviewed patient records, labs, notes, testing and imaging myself where available.  Lab Results  Component Value Date   WBC 8.9 05/26/2023   HGB 14.7 05/26/2023   HCT 44.0 05/26/2023   MCV 84.0 05/26/2023   PLT 201 05/26/2023      Component Value Date/Time   NA 140 05/26/2023 1110   NA 143 02/02/2023 0951   K 3.8 05/26/2023 1110   CL 106 05/26/2023 1110   CO2 25 05/26/2023 1110   GLUCOSE 167 (H) 05/26/2023 1110   BUN 13 05/26/2023 1110   BUN 21 02/02/2023 0951   CREATININE 1.12 05/26/2023 1110   CALCIUM 9.6 05/26/2023 1110   PROT 8.3 (H) 05/17/2020 1416   ALBUMIN 4.6 05/17/2020 1416   AST 23 05/17/2020 1416   ALT 33 05/17/2020 1416   ALKPHOS 99 05/17/2020 1416   BILITOT 0.6 05/17/2020 1416   GFRNONAA >60 05/26/2023 1110   Lab Results  Component Value Date   CHOL 174 02/02/2023   HDL 53 02/02/2023   LDLCALC 101 (H) 02/02/2023   TRIG 109 02/02/2023   CHOLHDL 3.3 02/02/2023   Lab Results  Component Value Date   HGBA1C 8.0 (A) 07/20/2023   Lab Results  Component Value Date   VITAMINB12 529 06/02/2022   Lab Results  Component Value Date   TSH 3.010 06/02/2022    05/17/20 MRI brain / IAC  - Prominent asymmetric enhancement of the left seventh cranial nerve extending from the IAC fundus through the mastoid segment. Findings are compatible with Bell's palsy in the appropriate clinical setting. Should the patient's symptoms persist or follow an unexpected course, follow-up brain MRI with contrast and with IAC protocol recommended. - T2 hyperintensity and enhancement within the left mandibular condyle, nonspecific but possibly degenerative or inflammatory in etiology. Clinical correlation is recommended.  MRI CERVICAL SPINE WO CONTRAST 09/16/2021 IMPRESSION:  MRI cervical spine (without) demonstrating: - At C6-7 disc  bulging and uncovertebral joint hypertrophy with moderate spinal stenosis and severe bilateral foraminal stenosis. No cord signal changes. - At C5-6  disc bulging and uncovertebral joint hypertrophy with mild spinal stenosis and severe bilateral foraminal stenosis. No cord signal changes. - At C4-5 right ward disc bulging and uncovertebral joint hypertrophy with severe right foraminal stenosis.        ASSESSMENT AND PLAN  58 y.o. year old male here with:  Dx:  No diagnosis found.     PLAN:   LEFT AND RIGHT SIDED WEAKNESS AND PARESTHESIAS BILATERAL HAND NUMBNESS (likely d/t severe cervical stenosis, peripheral neuropathy; carpal tunnel syndrome + ulnar neuropathy) GAIT IMPAIRMENT/IMBALANCE   -s/p ACDF C4-7 and left cubital tunnel release 05/2023 by Dr. Maisie Fus -in the process of applying for Social Security disability -EMG/NCV 07/2022 severe ulnar neuropathy at left and moderate on the right, severe distal lower extremity axonal polyneuropathy, mild upper extremity polyneuropathy, chronic neurogenic changes in left muscles that share C6 innervation consistent with MRI finding -MR cervical 09/16/2021 severe bilateral foraminal stenosis and disc bulging at C5-6 and C6-7 and severe right foraminal stenosis C4-5  -Increase amitriptyline to 125 mg nightly for continued pain and sleep. Can further discuss sleep difficulty and other recommendations with PCP at follow up visit next week.  -Previously questioned right sided stroke not seen on imaging contributing to symptoms but based on prior MRI C-spine and more recent EMG/NCV, left-sided weakness and paresthesias more likely in setting of severe cervical spinal stenosis, carpal tunnel syndrome and ulnar neuropathy. For now, can continue Plavix and atorvastatin for stroke prevention but can consider if ongoing need indicated at future f/u visits.  Continue to follow with PCP for aggressive stroke risk factor management   MIGRAINE WITH  AURA -overall well controlled, currently 2-3 per month -continue amitriptyline but will increase to 125mg  due to pain and sleep complaints -try Nurtec as needed for rescue, failed rizatriptan, sumatriptan and naratriptan   LEFT BELL'S PALSY (resolved) - continue supportive care    Follow-up in 6 months or call earlier if needed    No orders of the defined types were placed in this encounter.   No orders of the defined types were placed in this encounter.     CC:  Rema Fendt, NP    I spent 45 minutes of face-to-face and non-face-to-face time with patient.  This included previsit chart review, lab review, study review, order entry, electronic health record documentation, patient education and discussion regarding above diagnoses and treatment plan and answered all the questions to patient's satisfaction  Ihor Austin, [facility]  [facility] [address] [place], [place] [address]  Phone [phone] Fax [phone] Note: This document was prepared with digital dictation and possible smart phrase technology. Any transcriptional errors that result from this process are unintentional.

## 2023-08-03 ENCOUNTER — Ambulatory Visit: Payer: Medicaid Other | Admitting: Adult Health

## 2023-08-03 ENCOUNTER — Encounter: Payer: Self-pay | Admitting: Adult Health

## 2023-08-03 NOTE — Progress Notes (Unsigned)
 [facility]  PATIENT: Matthew Jennings DOB: 02/25/1966  REFERRING CLINICIAN: Rema Fendt, NP HISTORY FROM: patient  REASON FOR VISIT: Bell's Palsy, left-sided numbness   HISTORICAL  CHIEF COMPLAINT:  No chief complaint on file.   HISTORY OF PRESENT ILLNESS:     Update 08/04/2023 JM: Patient returns for 58-month follow-up.    S/p ACDF C4-7 and L cubital tunnel release 05/2023 with Dr. Maisie Fus  Migraines remain well-controlled on amitriptyline 100 mg nightly, use of naratriptan ***     History provided for reference purposes only Update 01/26/2023 JM: Patient returns for follow-up visit.  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  Referral placed to orthopedic and neurosurgery.  Reports continued left-sided weakness gradually worsening and now affecting right side. Reports more recently after resting right arm on table at chest height, will start to have pain in shoulder that radiates to ear and jaw. Reports worked with PT with some improvement but after stopping, symptoms returned and has been gradually worsening.  Recently had follow-up with neurosurgery Dr. Maisie Fus, OV note reviewed, due to failure of medical and physical therapy and progressive nature of symptoms resulting in functional loss, recommended pursuing surgical intervention in form of C4-5, C5-6, C6-7 ACDF as well as left cubital tunnel release and likely need to consider right cubital tunnel release in the future.  Patient questions pursuing surgery at this point, concerned that he was only seen by Dr. Maisie Fus twice and had  no further testing or imaging prior to recommending surgical intervention.   Reports continued migraine headaches, about 2-3 per month, usually has at least 1 more severe migraine.  Remains on amitriptyline 100 mg nightly for both migraine prophylaxis and neuropathic pain. Use of naratriptan but does cause fatigue, usually falls asleep and will wake up without migraine. C/o difficult time sleeping due to pain and unable to get comfortable.   Denies any other neurological or new stroke symptoms.  Remains on Plavix and atorvastatin.  Routinely follows with PCP for stroke risk factor management.  Update 06/02/2022 JM: Patient returns for 58-month follow-up.  He continues to experience left-sided numbness and weakness which has been gradually worsening since prior visit. Does have mild RLE numbness distally but this has been stable. He has difficulty holding or picking up objects and has been having more difficulty with his left leg giving out at times while ambulating, he does questions some weakness in LLE. Denies lower back pain. Has had difficulty sleeping due to pain especially in his neck and left shoulder. Has been previously referred to neurosurgery and orthopedics for cervical radiculopathy with difficulty finding office that will accept his insurance but has been recently approved for Medicaid.  He continues to apply for disability.  Migraines have improved since prior visit, about 3 migraines per month. Current use of amitriptyline 75 mg nightly. Was unable to try naratriptan due to co-pay.  He is interested in trying that he has Medicaid.  Denies any new stroke or neurological symptoms.  Remains on Plavix and atorvastatin.  Blood pressure well-controlled.  Has not had recent follow-up with PCP.  Update 11/25/2021 JM: Patient returns for 58-month follow-up unaccompanied.  Left arm weakness/numbness persist. Continued difficulty  with using left hand. Does have left shoulder pain. Reports  numbness/pain especially from elbow to outer hand. Continued neck pain. Can have difficulty sleeping at night.  Completed MR cervical spine which showed severe bilateral foraminal stenosis and disc bulging at C5-6 and C6-7, referral placed to neurosurgery but per patient, refused to see patient due to being covered under [facility].   Continued left leg numbness and imbalance. Continued b/l foot numbness.   Migraines still 2-3x per week but severe migraine about 1x per week which can be debilitating, did see much benefit after increasing amitriptyline dosage at prior visit. He does take nightly but has also been taking as needed for acute management in addition to naproxen. Migraines typically resolve after 30 to 60 minutes. Previously prescribed naratriptan as intolerance to rizatriptan and sumatriptan but reports he never received prescription.  Compliant on Plavix and atorvastatin. Denies any new stroke type symptoms. Blood pressure today 129/93.  Prior follow-up with PCP 6 months ago.  He is in the process of applying for Medicaid, reports needs to apply for disability first. Trying to collect all prior medical information as advised by social security.   MRI cervical spine 09/16/2021 IMPRESSION:  - At C6-7 disc bulging and uncovertebral joint hypertrophy with moderate spinal stenosis and severe bilateral foraminal stenosis. No cord signal changes. - At C5-6 disc bulging and uncovertebral joint hypertrophy with mild spinal stenosis and severe bilateral foraminal stenosis. No cord signal changes. - At C4-5 right ward disc bulging and uncovertebral joint hypertrophy with severe right foraminal stenosis.   UPDATE 08/26/2021 JM: Returns for 58-month follow-up unaccompanied.  Reports worsening of left hand weakness more so with digit 1 and 5, greater difficulty making a fist and dropping things, does have long standing hx of neck pain which has also been progressing, can radiate down in to his shoulder  and down the back side of his left arm, usually worse in the morning. Also reports continued imbalance and left leg numbness. Continues bilateral foot numbness, bottom of feet, stable. Continued migraines approximately 2-3x per week, currently on amitriptyline 25 mg nightly, will use Maxalt which can help take the edge off but unable to take during the day due to fatigue, will use naproxen during the day if needed with benefit if he takes soon enough. Does not overuse naproxen. 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 Hx of Bell's palsy, resolved.  Remains on Plavix and atorvastatin, denies side effects.  Blood pressure today 137/87.  A1c 9.0 (05/2021) with PCP making adjustments to diabetic regimen. No further concerns at this time.   UPDATE 02/18/2021 JM: Mr. Hillmer returns for 6 months follow-up.  No residual facial weakness or reoccurring of symptoms.  Continued left hand weakness with numbness (ring and pinky finger and occasional thumb) with difficulty picking up smaller objects and left leg numbness with leg giving out leading to imbalance.  No AD and no falls. Denies any associated pain such as in neck, lower back, hip or knee.  Also reports occasional slurred speech especially when trying to speak quicker.  All symptoms persistent since 05/2020.  Denies new stroke/TIA symptoms.  Reports improvement of bilateral foot numbness as glucose levels improved (most recent A1c 7.4 down from 10.4).  Migraine headaches improved since prior visit - only took 1 month of amitriptyline as he was not aware he had refills.  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 He has been doing  cooking at home (like he would at work) without difficulty.  Remains on atorvastatin 40 mg daily.  Blood pressure today 126/86.  No further concerns at this time.   Initial consult visit 08/20/2020 Dr. Marjory Lies: 58 year old male here for evaluation of Bell's palsy.  January 2022 patient had  onset of left facial weakness, went to the ER for evaluation.  He was also having left hand numbness at the time and had MRI of the brain which showed enhancement of the left facial nerve.  No cause for left hand numbness was found.  Patient followed up with ENT and Bell's palsy symptoms have improved.  Patient continues to have left hand numbness and weakness, mainly affecting digits 1 and 5.  Patient also has bilateral toe and feet numbness.  Patient has new diagnosis of diabetes, to started medications about 1 month ago.  A1c is greater than 11.  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  Patient has history of migraine since age 70 years old with global intense squeezing throbbing sensation, sensitive to light and sound, triggered by weather changes.  Having 3 headaches per week.  He has tried Aleve and Keppra in the past without relief.    REVIEW OF SYSTEMS: Full 14 system review of systems performed and negative with exception of: as per HPI.   ALLERGIES: Allergies  Allergen Reactions   Penicillins Anaphylaxis   Aspirin Other (See Comments)    Lyme Disease     HOME MEDICATIONS: Outpatient Medications Prior to Visit  Medication Sig Dispense Refill   amitriptyline (ELAVIL) 100 MG tablet Take 1 tablet (100 mg total) by mouth at bedtime. (Patient not taking: Reported on 07/23/2023) 90 tablet 1   amitriptyline (ELAVIL) 25 MG tablet Take 1 tablet (25 mg total) by mouth at bedtime. Take in addition to 100mg  tablet for total of 125mg  nightly (Patient not taking: Reported on 07/23/2023) 90 tablet 1   atorvastatin (LIPITOR) 40 MG tablet Take 1 tablet (40 mg total) by mouth daily. 90 tablet 0   Dulaglutide (TRULICITY) 0.75 MG/0.5ML SOAJ Inject 0.75 mg into the skin once a week. 2 mL 3   empagliflozin (JARDIANCE) 25 MG TABS tablet Take 1 tablet (25 mg total) by mouth daily before breakfast. 90 tablet 0   methocarbamol (ROBAXIN)  500 MG tablet Take 1 tablet (500 mg total) by mouth every 6 (six) hours as needed for muscle spasms. (Patient not taking: Reported on 07/23/2023) 120 tablet 0   oxyCODONE 10 MG TABS Take 1 tablet (10 mg total) by mouth every 4 (four) hours as needed for severe pain (pain score 7-10). (Patient not taking: Reported on 07/23/2023) 30 tablet 0   No facility-administered medications prior to visit.    PAST MEDICAL HISTORY: Past Medical History:  Diagnosis Date   Arthritis    Bell's palsy    left   DM (diabetes mellitus) (HCC)    Type 2   Facial paralysis/Bells palsy    Headache    Migraines   Hx of migraines    Stroke (HCC) 2020   Pt states it was a "Palsy episode but due to increase in symptoms, classified it as mild stroke" leaving pt with current spine/nerve/strength problems    PAST SURGICAL HISTORY: Past Surgical History:  Procedure Laterality Date   ANTERIOR CERVICAL DECOMP/DISCECTOMY FUSION N/A 06/02/2023   Procedure: CERVICAL FOUR-FIVE, CERVICAL FIVE-SIX, CERVICAL SIX-SEVEN ANTERIOR CERVICAL DECOMPRESSION/DISCECTOMY FUSION;  Surgeon: Bedelia Person,  MD;  Location: [facility] OR;  Service: Neurosurgery;  Laterality: N/A;   KNEE RECONSTRUCTION Left    58 years old   ULNAR NERVE TRANSPOSITION Left 06/02/2023   Procedure: ULNAR NERVE DECOMPRESSION/TRANSPOSITION;  Surgeon: Bedelia Person, MD;  Location: [facility] OR;  Service: Neurosurgery;  Laterality: Left;   VENTRAL HERNIA REPAIR     As a newborn    FAMILY HISTORY: Family History  Problem Relation Age of Onset   Cancer Mother    Alzheimer's disease Father     SOCIAL HISTORY: Social History   Socioeconomic History   Marital status: Single    Spouse name: Not on file   Number of children: 0   Years of education: Not on file   Highest education level: Associate degree: occupational, Scientist, product/process development, or vocational program  Occupational History    Comment: EMT  Tobacco Use   Smoking status: Never    Passive exposure: Yes    Smokeless tobacco: Never  Vaping Use   Vaping status: Never Used  Substance and Sexual Activity   Alcohol use: Yes    Comment: occ   Drug use: Not Currently   Sexual activity: Not Currently  Other Topics Concern   Not on file  Social History Narrative   Lives alone   Social Drivers of Health   Financial Resource Strain: High Risk (03/23/2023)   Overall Financial Resource Strain (CARDIA)    Difficulty of Paying Living Expenses: Very hard  Food Insecurity: Food Insecurity Present (03/23/2023)   Hunger Vital Sign    Worried About Running Out of Food in the Last Year: Often true    Ran Out of Food in the Last Year: Often true  Transportation Needs: No Transportation Needs (03/23/2023)   PRAPARE - Administrator, Civil Service (Medical): No    Lack of Transportation (Non-Medical): No  Physical Activity: Sufficiently Active (03/23/2023)   Exercise Vital Sign    Days of Exercise per Week: 5 days    Minutes of Exercise per Session: 30 min  Stress: Stress Concern Present (03/23/2023)   [employer] of Occupational Health - Occupational Stress Questionnaire    Feeling of Stress : Very much  Social Connections: Socially Isolated (03/23/2023)   Social Connection and Isolation Panel [NHANES]    Frequency of Communication with Friends and Family: More than three times a week    Frequency of Social Gatherings with Friends and Family: Three times a week    Attends Religious Services: Never    Active Member of Clubs or Organizations: No    Attends Banker Meetings: Never    Marital Status: Never married  Intimate Partner Violence: Not At Risk (03/23/2023)   Humiliation, Afraid, Rape, and Kick questionnaire    Fear of Current or Ex-Partner: No    Emotionally Abused: No    Physically Abused: No    Sexually Abused: No     PHYSICAL EXAM  GENERAL EXAM/CONSTITUTIONAL: Vitals:  There were no vitals filed for this visit.   There is no height or weight on  file to calculate BMI. Wt Readings from Last 3 Encounters:  07/23/23 165 lb 12.8 oz (75.2 kg)  06/22/23 161 lb 9.6 oz (73.3 kg)  06/02/23 170 lb (77.1 kg)   Patient is in no distress; very pleasant middle-age Caucasian male, well developed, nourished and groomed; neck is supple  CARDIOVASCULAR: Examination of carotid arteries is normal; no carotid bruits Regular rate and rhythm, no murmurs Examination of peripheral vascular system by observation  and palpation is normal  EYES: Ophthalmoscopic exam of optic discs and posterior segments is normal; no papilledema or hemorrhages  MUSCULOSKELETAL: Gait, strength, tone, movements noted in Neurologic exam below  NEUROLOGIC: MENTAL STATUS:  awake, alert, oriented to person, place and time recent and remote memory intact normal attention and concentration language fluent, comprehension intact, naming intact fund of knowledge appropriate  CRANIAL NERVE:  2nd - no papilledema on fundoscopic exam 2nd, 3rd, 4th, 6th - pupils equal and reactive to light, visual fields full to confrontation, extraocular muscles intact, no nystagmus 5th - facial sensation symmetric 7th - facial strength symmetric 8th - hearing intact 9th - palate elevates symmetrically, uvula midline 11th - shoulder shrug symmetric 12th - tongue protrusion midline  MOTOR:  LUE 4/5 WEAKNESS THROUGHOUT AND LLE 4+/5 HF AND ADF, MILD ATROPHY AND WEAKNESS LEFT HAND INTRINSIC MUSCLES. RUE 4+5, DIFFICULTY MAKING COMPLETE FIST. RLE 5/5  SENSORY:  normal symmetrically to vibration; DECREASED LIGHT TOUCH AND PP SENSATION RUE>LUE AND LLE AND DECR PP IN LEFT HAND, PIN/NEEDLE SENSATION LIGHT TOUCH BLE  COORDINATION:  finger-nose-finger mild incoordination of LUE, decreased RIGHT>LEFT fine finger movements  REFLEXES:  deep tendon reflexes TRACE and symmetric  GAIT/STATION:  narrow based gait with mild imbalance with decreased LLE stride length and step height without use of  assistive device. Able to stand from seated position with arms crossed.  Romberg positive     DIAGNOSTIC DATA (LABS, IMAGING, TESTING) - I reviewed patient records, labs, notes, testing and imaging myself where available.  Lab Results  Component Value Date   WBC 8.9 05/26/2023   HGB 14.7 05/26/2023   HCT 44.0 05/26/2023   MCV 84.0 05/26/2023   PLT 201 05/26/2023      Component Value Date/Time   NA 140 05/26/2023 1110   NA 143 02/02/2023 0951   K 3.8 05/26/2023 1110   CL 106 05/26/2023 1110   CO2 25 05/26/2023 1110   GLUCOSE 167 (H) 05/26/2023 1110   BUN 13 05/26/2023 1110   BUN 21 02/02/2023 0951   CREATININE 1.12 05/26/2023 1110   CALCIUM 9.6 05/26/2023 1110   PROT 8.3 (H) 05/17/2020 1416   ALBUMIN 4.6 05/17/2020 1416   AST 23 05/17/2020 1416   ALT 33 05/17/2020 1416   ALKPHOS 99 05/17/2020 1416   BILITOT 0.6 05/17/2020 1416   GFRNONAA >60 05/26/2023 1110   Lab Results  Component Value Date   CHOL 174 02/02/2023   HDL 53 02/02/2023   LDLCALC 101 (H) 02/02/2023   TRIG 109 02/02/2023   CHOLHDL 3.3 02/02/2023   Lab Results  Component Value Date   HGBA1C 8.0 (A) 07/20/2023   Lab Results  Component Value Date   VITAMINB12 529 06/02/2022   Lab Results  Component Value Date   TSH 3.010 06/02/2022    05/17/20 MRI brain / IAC  - Prominent asymmetric enhancement of the left seventh cranial nerve extending from the IAC fundus through the mastoid segment. Findings are compatible with Bell's palsy in the appropriate clinical setting. Should the patient's symptoms persist or follow an unexpected course, follow-up brain MRI with contrast and with IAC protocol recommended. - T2 hyperintensity and enhancement within the left mandibular condyle, nonspecific but possibly degenerative or inflammatory in etiology. Clinical correlation is recommended.  MRI CERVICAL SPINE WO CONTRAST 09/16/2021 IMPRESSION:  MRI cervical spine (without) demonstrating: - At C6-7 disc  bulging and uncovertebral joint hypertrophy with moderate spinal stenosis and severe bilateral foraminal stenosis. No cord signal changes. - At C5-6  disc bulging and uncovertebral joint hypertrophy with mild spinal stenosis and severe bilateral foraminal stenosis. No cord signal changes. - At C4-5 right ward disc bulging and uncovertebral joint hypertrophy with severe right foraminal stenosis.        ASSESSMENT AND PLAN  58 y.o. year old male here with:  Dx:  No diagnosis found.     PLAN:   LEFT AND RIGHT SIDED WEAKNESS AND PARESTHESIAS BILATERAL HAND NUMBNESS (likely d/t severe cervical stenosis, peripheral neuropathy; carpal tunnel syndrome + ulnar neuropathy) GAIT IMPAIRMENT/IMBALANCE   -s/p ACDF C4-7 and left cubital tunnel release 05/2023 by Dr. Maisie Fus -in the process of applying for Social Security disability -EMG/NCV 07/2022 severe ulnar neuropathy at left and moderate on the right, severe distal lower extremity axonal polyneuropathy, mild upper extremity polyneuropathy, chronic neurogenic changes in left muscles that share C6 innervation consistent with MRI finding -MR cervical 09/16/2021 severe bilateral foraminal stenosis and disc bulging at C5-6 and C6-7 and severe right foraminal stenosis C4-5  -Increase amitriptyline to 125 mg nightly for continued pain and sleep. Can further discuss sleep difficulty and other recommendations with PCP at follow up visit next week.  -Previously questioned right sided stroke not seen on imaging contributing to symptoms but based on prior MRI C-spine and more recent EMG/NCV, left-sided weakness and paresthesias more likely in setting of severe cervical spinal stenosis, carpal tunnel syndrome and ulnar neuropathy. For now, can continue Plavix and atorvastatin for stroke prevention but can consider if ongoing need indicated at future f/u visits.  Continue to follow with PCP for aggressive stroke risk factor management   MIGRAINE WITH  AURA -overall well controlled, currently 2-3 per month -continue amitriptyline but will increase to 125mg  due to pain and sleep complaints -try Nurtec as needed for rescue, failed rizatriptan, sumatriptan and naratriptan   LEFT BELL'S PALSY (resolved) - continue supportive care    Follow-up in 6 months or call earlier if needed    No orders of the defined types were placed in this encounter.   No orders of the defined types were placed in this encounter.     CC:  Rema Fendt, NP    I spent 45 minutes of face-to-face and non-face-to-face time with patient.  This included previsit chart review, lab review, study review, order entry, electronic health record documentation, patient education and discussion regarding above diagnoses and treatment plan and answered all the questions to patient's satisfaction  Ihor Austin, [facility]  [facility] [address] [place], [place] [address]  Phone [phone] Fax [phone] Note: This document was prepared with digital dictation and possible smart phrase technology. Any transcriptional errors that result from this process are unintentional.

## 2023-08-04 ENCOUNTER — Encounter: Payer: Self-pay | Admitting: Adult Health

## 2023-08-04 ENCOUNTER — Ambulatory Visit: Admitting: Adult Health

## 2023-08-04 VITALS — BP 119/79 | HR 70 | Ht 71.0 in | Wt 165.4 lb

## 2023-08-04 DIAGNOSIS — G5623 Lesion of ulnar nerve, bilateral upper limbs: Secondary | ICD-10-CM

## 2023-08-04 DIAGNOSIS — R2689 Other abnormalities of gait and mobility: Secondary | ICD-10-CM

## 2023-08-04 DIAGNOSIS — M4802 Spinal stenosis, cervical region: Secondary | ICD-10-CM | POA: Diagnosis not present

## 2023-08-04 DIAGNOSIS — Z8669 Personal history of other diseases of the nervous system and sense organs: Secondary | ICD-10-CM

## 2023-08-04 DIAGNOSIS — E785 Hyperlipidemia, unspecified: Secondary | ICD-10-CM | POA: Diagnosis not present

## 2023-08-04 DIAGNOSIS — Z9889 Other specified postprocedural states: Secondary | ICD-10-CM | POA: Diagnosis not present

## 2023-08-04 DIAGNOSIS — G43109 Migraine with aura, not intractable, without status migrainosus: Secondary | ICD-10-CM | POA: Diagnosis not present

## 2023-08-04 MED ORDER — ATORVASTATIN CALCIUM 40 MG PO TABS: 40.0000 mg | ORAL_TABLET | Freq: Every day | ORAL | 0 refills | Status: DC

## 2023-08-04 MED ORDER — NURTEC 75 MG PO TBDP: 75.0000 mg | ORAL_TABLET | ORAL | 11 refills | Status: AC | PRN

## 2023-08-04 NOTE — Patient Instructions (Addendum)
 Your Plan:  Start Nurtec to use as needed for migraine rescue  Restart atorvastatin (Lipitor) for cholesterol management   Continue to follow with Dr. Maisie Fus for possible further surgery       Follow up in 6 months or call earlier if needed      Thank you for coming to see [place] at [facility]. I hope we have been able to provide you high quality care today.  You may receive a patient satisfaction survey over the next few weeks. We would appreciate your feedback and comments so that we may continue to improve ourselves and the health of our patients.

## 2023-08-04 NOTE — Progress Notes (Signed)
 I agree with the above plan

## 2023-08-05 ENCOUNTER — Other Ambulatory Visit: Payer: Self-pay

## 2023-08-05 ENCOUNTER — Encounter: Payer: Self-pay | Admitting: Adult Health

## 2023-08-25 ENCOUNTER — Ambulatory Visit: Payer: Self-pay

## 2023-08-25 NOTE — Telephone Encounter (Signed)
 Patient called and stated Libre glucose reader is not working. Patient advised to monitor with his finger stick glucose machine tonight and call Endocrinology first thing in the morning.    Copied from CRM [id number]. Topic: Clinical - Red Word Triage >> Aug 25, 2023  5:52 PM DeAngela L wrote: Red Word that prompted transfer to Nurse Triage: Patient changed his  Jerrilyn Moras 3 sensor 440pm and his phone reads sensor error and nothing has updated since he changed it Reason for Disposition  Health Information question, no triage required and triager able to answer question  Answer Assessment - Initial Assessment Questions 1. REASON FOR CALL or QUESTION: "What is your reason for calling today?" or "How can I best help you?" or "What question do you have that I can help answer?"     Patient has questions about Libre3 machine  Protocols used: Information Only Call - No Triage-A-AH

## 2023-08-26 NOTE — Telephone Encounter (Signed)
 Spoke with patient. Patient said the his sensor began working last night. Advised that if the occurs again to turn off his phone and restart, then reconnect to the [facility] app. Patient voiced understanding.

## 2023-08-27 ENCOUNTER — Encounter: Payer: Self-pay | Admitting: Podiatry

## 2023-08-27 ENCOUNTER — Ambulatory Visit (INDEPENDENT_AMBULATORY_CARE_PROVIDER_SITE_OTHER): Payer: Medicaid Other | Admitting: Podiatry

## 2023-08-27 DIAGNOSIS — E1142 Type 2 diabetes mellitus with diabetic polyneuropathy: Secondary | ICD-10-CM | POA: Diagnosis not present

## 2023-08-27 DIAGNOSIS — M79676 Pain in unspecified toe(s): Secondary | ICD-10-CM

## 2023-08-27 DIAGNOSIS — B351 Tinea unguium: Secondary | ICD-10-CM | POA: Diagnosis not present

## 2023-08-27 DIAGNOSIS — G5622 Lesion of ulnar nerve, left upper limb: Secondary | ICD-10-CM | POA: Insufficient documentation

## 2023-08-27 MED ORDER — PREGABALIN 25 MG PO CAPS
25.0000 mg | ORAL_CAPSULE | Freq: Two times a day (BID) | ORAL | 0 refills | Status: AC
Start: 2023-08-27 — End: ?

## 2023-08-27 NOTE — Progress Notes (Signed)
 Chief Complaint  Patient presents with   Diabetes    Patient states returning for diabetic foot care- discuss neuropathy treatment today, last A1c was 8.0    HPI: 58 y.o. male presents today for diabetic foot evaluation.  Last A1c 8.7.  He does endorse numbness, burning, pins-and-needles feeling in his toes.  He does state that he has history of neck and back pain numbness upcoming neck surgery for this.  He does state that the numbness and tingling in his feet does affect his sleep and how much he walks.  He is presenting with painful, thickened, elongated dystrophic toenails that he is unable to maintain himself due to their dystrophic nature.  Past Medical History:  Diagnosis Date   Arthritis    Bell's palsy    left   DM (diabetes mellitus) (HCC)    Type 2   Facial paralysis/Bells palsy    Headache    Migraines   Hx of migraines    Stroke (HCC) 2020   Pt states it was a "Palsy episode but due to increase in symptoms, classified it as mild stroke" leaving pt with current spine/nerve/strength problems    Past Surgical History:  Procedure Laterality Date   ANTERIOR CERVICAL DECOMP/DISCECTOMY FUSION N/A 06/02/2023   Procedure: CERVICAL FOUR-FIVE, CERVICAL FIVE-SIX, CERVICAL SIX-SEVEN ANTERIOR CERVICAL DECOMPRESSION/DISCECTOMY FUSION;  Surgeon: Van Gelinas, MD;  Location: [facility] OR;  Service: Neurosurgery;  Laterality: N/A;   KNEE RECONSTRUCTION Left    58 years old   ULNAR NERVE TRANSPOSITION Left 06/02/2023   Procedure: ULNAR NERVE DECOMPRESSION/TRANSPOSITION;  Surgeon: Van Gelinas, MD;  Location: [facility] OR;  Service: Neurosurgery;  Laterality: Left;   VENTRAL HERNIA REPAIR     As a newborn    Allergies  Allergen Reactions   Penicillins Anaphylaxis   Aspirin Other (See Comments)    Lyme Disease     ROS    Physical Exam: There were no vitals filed for this visit.  General: The patient is alert and oriented x3 in no acute distress.  Dermatology: Skin is  warm, dry and supple bilateral lower extremities. Interspaces are clear of maceration and debris.  Nail plates x 10 bilaterally are thickened, elongated, dystrophic with subungual debris.  They are tender on direct dorsal palpation.  Vascular: Palpable pedal pulses bilaterally. Capillary refill within normal limits.  No appreciable edema.  No erythema or calor.  Neurological: Light touch sensation grossly intact bilateral feet.  Vibratory sensation absent, absent to midfoot right lower extremity.  Protective sensation decreased 5/10 sites bilaterally.  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  Muscle strength 5/5 in dorsiflexion, paraphasia, inversion, eversion.   Assessment/Plan of Care: 1. Diabetic polyneuropathy associated with type 2 diabetes mellitus (HCC)   2. Pain due to onychomycosis of toenails of both feet      Meds ordered this encounter  Medications   pregabalin  (LYRICA ) 25 MG capsule    Sig: Take 1 capsule (25 mg total) by mouth 2 (two) times daily.    Dispense:  60 capsule    Refill:  0   None  Discussed clinical findings with patient today.  # Diabetic neuropathy with associated hammertoes -associated cavus foot deformity -Patient educated on diabetes. Discussed proper diabetic foot care and discussed risks and complications of disease. Educated patient in depth on reasons to return to the office immediately should he/she discover anything concerning or new on the feet. All questions answered. Discussed proper shoes as well.  -  Patient states that he has tried gabapentin  in the past and that it was not helpful for him.  He is currently on amitriptyline .  Sending in a course of Lyrica , we will see how patient does with this over the next month or so. - Did discuss alternative options including spinal stimulator and Qutenza.  Not interested in spinal stimulator at this time as he currently has more back and neck surgeries.  He is  interested in Qutenza once he switches over to Medicare.  # Onychomycosis of toenails - Nail plates x 10 were debrided in thickness and length using sterile nail nippers without incident. - Mechanical bur used to file down the nails  Follow-up in 1 month after Lyrica  trial  Deng Kemler L. Lunda Salines, AACFAS [facility]     [address] [place], [place] [address]                Office [phone]  Fax [phone]

## 2023-09-06 NOTE — Progress Notes (Signed)
 S:     No chief complaint on file.  58 y.o. male who presents for diabetes evaluation, education, and management in the context of the LIBERATE Study.   Patient arrives in good spirits. Presents for diabetes evaluation, education, and management. Patient was referred by Primary Care Provider on 06/22/23. A1c on 06/02/23 prior to surgery (cervical spine stenosis) worsened from 8.3% to 9.7%. Patient declined pharmacological therapy for HTN at last PCP appt. At pharmacy appt on 06/09/23 patient was interested in the LIBERATE study. He was enrolled at follow-up on 07/20/23 with an A1C of 8.0%. He was amenable to retrialing Trulicity , now that he has [employer]. He was instructed to stop Januvia  and start Trulicity  0.75 mg. He was instructed to restart atorvastatin  at neurology appointment on 08/04/23.   PMH includes T2D, Bell's palsy, HTN, HLD, stroke (2020)  Patient's diabetes is longstanding. Today, he reports doing well.***  Trulicity  adherence?***  OLD: The only medications he is taking are Jardiance  and Januvia . He has not restarted his statin since sugery but plans to discuss restarting this with his PCP later this week. Reports that he did not take any medications after surgery due to feeling nauseous. Denies side effects. He is interested in enrolling in the LIBERATE study today and has his phone with him. He does express some concern with the device staying on as he moves around a lot in his sleep. Patient reports that he lost 40 lbs on Ozempic  leading to discontinuation. He reports that he previously tolerated Trulicity  well and only stopped taking it because he was no longer able to get the medication via PAP. He is agreeable to retrialing Trulicity  now that he has [employer].   Family/Social History:  FH: no pertinent positives Tobacco: never smoker Alcohol: occasional use   Insurance coverage/medication affordability: Managed Medicaid - [employer]  Current  diabetes medications include: Jardiance  25 mg daily, Januvia  50 mg daily -Intolerance to Ozempic  2 mg - endorses too much weight loss (40 lbs).  -Reports constipation and bloating with metformin  IR.  Current hyperlipidemia medications: atorvastatin  40 mg (last filled 90 ds on 02/02/23) - reported this was stopped after his neurosurgery procedure, but he will discuss restarting with Amy on 07/23/23  Patient denies nocturia.  Patient endorses neuropathy (nerve pain) - numbness in lower extremity, in the midst of neurosurgery procedures Patient endorses stable visual changes- blurry vision Patient endorses polyphagia. Patient reports self foot exams.     Patient reported home blood sugars: FBG today: 176, usually 210-220  Patient reported dietary habits: Reports that he recently reduced intake of sugary fruits.  Exercise habits: yard work, house work       O:       Human resources officer Value Date   HGBA1C 8.0 (A) 07/20/2023    POC A1c Today: 8.0  There were no vitals filed for this visit.  UACR 06/22/23:  38 mg/g  Lipid Panel     Component Value Date/Time   CHOL 174 02/02/2023 0951   TRIG 109 02/02/2023 0951   HDL 53 02/02/2023 0951   CHOLHDL 3.3 02/02/2023 0951   LDLCALC 101 (H) 02/02/2023 0951    Clinical Atherosclerotic Cardiovascular Disease (ASCVD): Yes  The ASCVD Risk score (Arnett DK, et al., 2019) failed to calculate for the following reasons:   Risk score cannot be calculated because patient has a medical history suggesting prior/existing ASCVD   Patient is participating in a Managed Medicaid Plan:  Yes   A/P:  LIBERATE Study:  -Patient provided verbal consent to participate in the study. Consent documented in electronic medical record.  -Provided education on Libre 3 CGM. Collaborated to ensure Jerrilyn Moras 3 app was downloaded on patient's phone. Educated on how to place sensor every 14 days, patient placed first sensor correctly and verbalized understanding  of use, removal, and how to place next sensor. Discussed alarms. 6 sensors provided for a 3 month supply. Educated to contact the office if the sensor falls off early and replacements are needed before their next [employer].   Diabetes longstanding. A1c of 8.0% is uncontrolled above goal < 7.0%, but improved from 9.3% since restarting medications post-surgery and dietary changes. Patient agreeable to transition from Januvia  to Trulicity  for improved glycemic control. Will monitor for undesirable weight loss. Patient is able to verbalize appropriate hypoglycemia management plan but is asymptomatic today. Medication adherence appears optimal.   - STOP Januvia  - START Trulicity  0.75 mg injected into the skin weekly. Submitted PA Key: [id number]. - Continue Jardiance  25 mg daily - Educated on [facility] sensor application and usage. Connected to clinic LibreView. Recommend to monitor blood glucose continuously.  - Extensively discussed pathophysiology of diabetes, recommended lifestyle interventions, dietary effects on blood sugar control - Counseled on s/sx of and management of hypoglycemia - Next A1C anticipated 10/2023  Hyperlipidemia/ASCVD Risk Reduction: - Currently uncontrolled with LDL-C above goal < 55 mg/dL given premature ASCVD. Encouraged patient to restart statin - he prefers to touch base with PCP this week before picking up medication. High intensity statin indicated - Reviewed long term complications of uncontrolled cholesterol - Recommend to restart atorvastatin  40 mg daily  - Discuss ASA 81 mg daily at PCP appointment given hx of CVA  Written patient instructions provided.  Total time in face to face counseling 45 minutes.    Pharmacist: 09/07/23*** PCP: Needs to be scheduled***  Arthea Larsson, PharmD PGY1 Pharmacy Resident  Marene Shape, PharmD, BCACP, CPP Clinical Pharmacist [facility] [phone]

## 2023-09-07 ENCOUNTER — Telehealth: Payer: Self-pay | Admitting: Family

## 2023-09-07 ENCOUNTER — Ambulatory Visit: Attending: Family | Admitting: Pharmacist

## 2023-09-07 ENCOUNTER — Encounter: Payer: Self-pay | Admitting: Pharmacist

## 2023-09-07 ENCOUNTER — Other Ambulatory Visit: Payer: Self-pay

## 2023-09-07 DIAGNOSIS — Z713 Dietary counseling and surveillance: Secondary | ICD-10-CM | POA: Diagnosis not present

## 2023-09-07 DIAGNOSIS — Z8673 Personal history of transient ischemic attack (TIA), and cerebral infarction without residual deficits: Secondary | ICD-10-CM | POA: Insufficient documentation

## 2023-09-07 DIAGNOSIS — E785 Hyperlipidemia, unspecified: Secondary | ICD-10-CM | POA: Insufficient documentation

## 2023-09-07 DIAGNOSIS — I10 Essential (primary) hypertension: Secondary | ICD-10-CM | POA: Insufficient documentation

## 2023-09-07 DIAGNOSIS — Z7984 Long term (current) use of oral hypoglycemic drugs: Secondary | ICD-10-CM | POA: Insufficient documentation

## 2023-09-07 DIAGNOSIS — Z7985 Long-term (current) use of injectable non-insulin antidiabetic drugs: Secondary | ICD-10-CM | POA: Insufficient documentation

## 2023-09-07 DIAGNOSIS — E119 Type 2 diabetes mellitus without complications: Secondary | ICD-10-CM | POA: Insufficient documentation

## 2023-09-07 MED ORDER — ATORVASTATIN CALCIUM 40 MG PO TABS: 40.0000 mg | ORAL_TABLET | Freq: Every day | ORAL | 3 refills | Status: DC

## 2023-09-07 MED ORDER — EMPAGLIFLOZIN 25 MG PO TABS: 25.0000 mg | ORAL_TABLET | Freq: Every day | ORAL | 2 refills | Status: DC

## 2023-09-07 MED ORDER — TRULICITY 1.5 MG/0.5ML ~~LOC~~ SOAJ: 1.5000 mg | SUBCUTANEOUS | 2 refills | Status: DC

## 2023-09-07 NOTE — Telephone Encounter (Signed)
 Copied from CRM [id number]. Topic: Clinical - Prescription Issue >> Sep 07, 2023 10:39 AM Baldemar Lev wrote: Reason for CRM: Pt called reporting that he needs Prior Auth for Trulicity  and Jardiance . Please advise   Best contact: [phone]

## 2023-09-21 ENCOUNTER — Telehealth: Payer: Self-pay | Admitting: Pharmacist

## 2023-09-21 ENCOUNTER — Telehealth: Payer: Self-pay | Admitting: Family

## 2023-09-21 ENCOUNTER — Other Ambulatory Visit: Payer: Self-pay

## 2023-09-21 ENCOUNTER — Telehealth: Payer: Self-pay

## 2023-09-21 NOTE — Telephone Encounter (Signed)
 FYI Copied from CRM [id number]. Topic: General - Other >> Sep 21, 2023  3:44 PM Santiya F wrote:  Reason for CRM: Patient is calling in requesting to speak with [facility] regarding a prior authorization.

## 2023-09-21 NOTE — Telephone Encounter (Signed)
 Hey friend,   It looks like the Trulicity  PA was approved already? I'm not sure about the Jardiance . Would you be able to submit a PA for the Jardiance ?

## 2023-09-21 NOTE — Telephone Encounter (Signed)
 Pharmacy Patient Advocate Encounter   Received notification from CoverMyMeds that prior authorization for JARDIANCE  is required/requested.   Insurance verification completed.   The patient is insured through [facility] MEDICAID .   Per test claim: PA required; PA submitted to above mentioned insurance via CoverMyMeds Key/confirmation #/EOC [id number] W Status is pending

## 2023-09-21 NOTE — Telephone Encounter (Signed)
 Copied from CRM [id number]. Topic: Clinical - Prescription Issue >> Sep 21, 2023  3:39 PM Elle L wrote:  Reason for CRM: [facility] called on behalf of the patient to advise that he needs a prior authorization for Dulaglutide  (TRULICITY ) 1.5 MG/0.5ML SOAJ AND empagliflozin  (JARDIANCE ) 25 MG TABS tablet. They are faxing over the request as well.

## 2023-09-21 NOTE — Telephone Encounter (Signed)
 Pharmacy Patient Advocate Encounter   Received notification from CoverMyMeds that prior authorization for TRULICITY  is required/requested.   Insurance verification completed.   The patient is insured through [facility] MEDICAID .   Per test claim: PA required; PA submitted to above mentioned insurance via [facility] Tracks Key/confirmation #/EOC [id number] W Status is pending

## 2023-09-21 NOTE — Telephone Encounter (Signed)
Please assist with prior authorization. Thank you

## 2023-09-22 ENCOUNTER — Other Ambulatory Visit: Payer: Self-pay

## 2023-09-22 ENCOUNTER — Telehealth: Payer: Self-pay

## 2023-09-22 NOTE — Telephone Encounter (Signed)
 Pharmacy Patient Advocate Encounter  Received notification from [facility] MEDICAID that Prior Authorization for JARDIANCE  has been APPROVED from 09/21/2023 to 09/20/2024   PA #/Case ID/Reference #: [id number] W  Pharmacy Patient Advocate Encounter  Received notification from [facility] MEDICAID that Prior Authorization for TRULICITY  has been APPROVED from 09/22/2023 to 09/21/2024   PA #/Case ID/Reference #: [id number] W

## 2023-09-24 ENCOUNTER — Ambulatory Visit: Admitting: Podiatry

## 2023-10-13 ENCOUNTER — Ambulatory Visit: Admitting: Pharmacist

## 2023-10-15 NOTE — Progress Notes (Signed)
 S:     No chief complaint on file.  58 y.o. male who presents for diabetes evaluation, education, and management in the context of the LIBERATE Study.   Patient arrives in good spirits. Presents for diabetes evaluation, education, and management. Patient was referred by Primary Care Provider on 06/22/23. A1c on 06/02/23 prior to surgery (cervical spine stenosis) worsened from 8.3% to 9.7%. Patient declined pharmacological therapy for HTN at last PCP appt. At pharmacy appt on 06/09/23 patient was interested in the LIBERATE study. He was enrolled at follow-up on 07/20/23 with an A1C of 8.0%. At last visit with pharmacy on 09/07/23, he was instructed to increase Trulicity  to 1.5 mg and continue Jardiance . He was instructed to restart atorvastatin  at neurology appointment on 08/04/23 but had not restarted it as of last visit.   PMH includes T2D, Bell's palsy, HTN, HLD, stroke (2020)  Patient's diabetes is longstanding. Today, he reports doing well. He was able to increase Trulicity  a couple weeks after our last appointment (the pharmacy had a delay in getting in filled). *** Since starting, he reports that he has not missed a dose. He takes it on Mondays. Denies any issues with nausea, vomiting, GI upset. *** The only other medication he is taking is Jardiance  and amitriptyline  as needed. He has not restarted atorvastatin  as instructed at his last neurology visit, and states that he needs a refill. *** start atorva? He is enjoying wearing the freestyle libre sensors, and states he has been taking notes on which foods spike his blood sugars. He notes that sandwiches and starchy vegetables (creamed corn, potatoes) cause a spike in BG. He has transitioned from regular soda to zero sugar sodas. He has increased intake of dairy, drinking more milk. *** any other dietary changes? He is in the process of applying for disability.   Family/Social History:  FH: no pertinent positives Tobacco: never smoker Alcohol:  occasional use   Insurance coverage/medication affordability: Managed Medicaid - [employer]  Current diabetes medications include: Jardiance  25 mg daily, Trulicity  1.5 mg weekly (Mondays) -Intolerance to Ozempic  2 mg - endorses too much weight loss.  -Reports constipation and bloating with metformin  IR.  Current hyperlipidemia medications: atorvastatin  40 mg (last filled 30 ds on 09/07/23) - reported this was stopped after his neurosurgery procedure, but was resumed on 09/07/23   Patient denies nocturia.  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 *** Patient endorses stable visual changes- blurry vision *** Patient endorses polyphagia. *** Patient reports self foot exams.     Patient reported home blood sugars: FBG today: 176, usually 210-220 ***  Patient reported dietary habits: Reports that he recently reduced intake of sugary fruits.  Exercise habits: yard work, house work   CGM Study Consent: Yes Study visit: Initial Visit   Diabetes Distress Scale Feeling like diabetes is taking up too much of my mental and physical energy every day.: Not a problem Feeling that my doctor doesn't know enough about diabetes and diabetic care. : Not a problem Feeling angry, scared, and/or depressed when I think about living with diabetes : A slight problem Feeling that my doctor doesn't give my clear directions on how to manage my diabetes. : Not a problem Feeling that im not testing my blood sugars frequently enough.: Not a problem Feeling that I'm often failing with my diabetes routine: A moderate problem Feelling that friends and family are not supportive enough of self care efforts.: Not  a problem Feeling that diabetes controls my life.: A slight problem Feeling that my doctor doesn't take my concerns seriously enough: Not a problem Not feeling confident in my day to day ability to manage diabetes: A slight  problem Feeling that I will end up with serious long term complications no matter what I do.: A slight problem Feeling that I am not sticking closely enough to a good meal plan.: A moderate problem Feeling that friends or family don't appreciate how difficult living with diabetes can be. : Not a problem Feeling overwhelmed by the demands of living with diabetes.: A slight problem Feeling that I don't have a doctor who I can see.: Not a problem Not feeling motivated to keep up my diabetes self management.: A slight problem Feeling that friends or family don't give me the emotional support that I would like. : Not a problem DDS17 Score: 27 Emotional Burden Score: 1.8 Physician related distress score: 1 Regimen Related Distress score : 2.2 Interpersonal distress score: 1     O:   CGM Data: Date of Download: 09/07/23 - *** day report % Time CGM is active: 95% Average Glucose: 215 mg/dL Glucose Management Indicator: 8.5  Glucose Variability: 26.2 (goal <36%) Time in Goal:  - Time in range 70-180: 29% - Time above range: 71% - Time below range: 0% Observed patterns: Consistently ~200 mg/dL throughout the day, elevated fastings  Lab Results  Component Value Date   HGBA1C 8.0 (A) 07/20/2023    POC A1c Today: ***   There were no vitals filed for this visit.   Lipid Panel     Component Value Date/Time   CHOL 174 02/02/2023 0951   TRIG 109 02/02/2023 0951   HDL 53 02/02/2023 0951   CHOLHDL 3.3 02/02/2023 0951   LDLCALC 101 (H) 02/02/2023 0951    Clinical Atherosclerotic Cardiovascular Disease (ASCVD): Yes  The ASCVD Risk score (Arnett DK, et al., 2019) failed to calculate for the following reasons:   Risk score cannot be calculated because patient has a medical history suggesting prior/existing ASCVD   Patient is participating in a Managed Medicaid Plan:  Yes   A/P:  LIBERATE Study:  -Patient provided verbal consent to participate in the study. Consent documented in  electronic medical record.  -Provided education on Libre 3 CGM. Collaborated to ensure Jerrilyn Moras 3 app was downloaded on patient's phone. Educated on how to place sensor every 14 days, patient placed first sensor correctly and verbalized understanding of use, removal, and how to place next sensor. Discussed alarms. 6 sensors provided for a 3 month supply. Educated to contact the office if the sensor falls off early and replacements are needed before their next [employer].   Diabetes longstanding. A1c of 8.0% is uncontrolled above goal < 7.0%, but improved from 9.3% since restarting medications post-surgery and dietary changes. Since on Trulicity , will monitor for undesirable weight loss. Patient is able to verbalize appropriate hypoglycemia management plan but is asymptomatic today. Medication adherence appears suboptimal due to ***.   - START Trulicity  0.75 mg injected into the skin weekly. Submitted PA Key: [id number]. - Continue Jardiance  25 mg daily - Educated on [facility] sensor application and usage. Connected to clinic LibreView. Recommend to monitor blood glucose continuously.  - Extensively discussed pathophysiology of diabetes, recommended lifestyle interventions, dietary effects on blood sugar control - Counseled on s/sx of and management of hypoglycemia - Next A1C anticipated 10/2023 >> get today!!   Hyperlipidemia/ASCVD Risk Reduction: - Currently uncontrolled with LDL-C above  goal < 55 mg/dL given premature ASCVD. Encouraged patient to restart statin - he prefers to touch base with PCP this week before picking up medication. High intensity statin indicated - Reviewed long term complications of uncontrolled cholesterol - Recommend to restart atorvastatin  40 mg daily  - Discuss ASA 81 mg daily at PCP appointment given hx of CVA  Written patient instructions provided.  Total time in face to face counseling 45 minutes.    Follow-up: Pharmacist: *** PCP: None scheduled  Juleen Oakland,  PharmD PGY1 Pharmacy Resident  Marene Shape, PharmD, BCACP, CPP Clinical Pharmacist [facility] [phone]

## 2023-10-16 ENCOUNTER — Ambulatory Visit: Attending: Family | Admitting: Pharmacist

## 2023-10-16 ENCOUNTER — Encounter: Payer: Self-pay | Admitting: Pharmacist

## 2023-10-16 DIAGNOSIS — E785 Hyperlipidemia, unspecified: Secondary | ICD-10-CM

## 2023-10-16 DIAGNOSIS — Z7985 Long-term (current) use of injectable non-insulin antidiabetic drugs: Secondary | ICD-10-CM | POA: Diagnosis not present

## 2023-10-16 DIAGNOSIS — E119 Type 2 diabetes mellitus without complications: Secondary | ICD-10-CM

## 2023-10-16 DIAGNOSIS — Z7984 Long term (current) use of oral hypoglycemic drugs: Secondary | ICD-10-CM

## 2023-10-16 LAB — POCT GLYCOSYLATED HEMOGLOBIN (HGB A1C): HbA1c, POC (controlled diabetic range): 7.9 % — AB (ref 0.0–7.0)

## 2023-10-16 MED ORDER — TRULICITY 3 MG/0.5ML ~~LOC~~ SOAJ: 3.0000 mg | SUBCUTANEOUS | 1 refills | Status: DC

## 2023-10-19 ENCOUNTER — Ambulatory Visit: Payer: Self-pay | Admitting: Family

## 2023-11-09 NOTE — Progress Notes (Signed)
 " Cardiology Office Note:  .   Date:  11/10/2023  ID:  Matthew Jennings, DOB [birth year], MRN [id number] PCP: Lorren Greig PARAS, NP  [facility] Providers Cardiologist:  Gordy Bergamo, MD   History of Present Illness: .   Matthew Jennings is a 58 y.o. Caucasian male patient with longstanding uncontrolled diabetes mellitus, hypertension, hypercholesterolemia, history of ?stroke in 2020, cervical spine radiculopathy referred to me for cardiac risk stratification.   Discussed the use of AI scribe software for clinical note transcription with the patient, who gave verbal consent to proceed.  History of Present Illness Matthew Jennings is a 58 year old male who presents for a cardiac evaluation. He was referred by his primary care doctor, Amy, for a heart check due to his risk factors.  He is asymptomatic with no chest pain or dyspnea. Cardiac clearance is not required for an upcoming cervical spine surgery in August as per patient but in view of his strong cardiovascular risk factors wanted to have a baseline. He is on atorvastatin  for cholesterol management, with the last check in September 2024. His diet includes fish and chicken, with minimal red meat and fried foods. He uses the Tiffin 3 for glucose monitoring, with occasional blood sugar spikes not exceeding 250 mg/dL, and is actively managing his diabetes. He lives alone, is permanently disabled, and engages in light activities. He does not smoke and has never smoked.  Labs   Lab Results  Component Value Date   CHOL 174 02/02/2023   HDL 53 02/02/2023   LDLCALC 101 (H) 02/02/2023   TRIG 109 02/02/2023   CHOLHDL 3.3 02/02/2023   Lab Results  Component Value Date   NA 140 05/26/2023   K 3.8 05/26/2023   CO2 25 05/26/2023   GLUCOSE 167 (H) 05/26/2023   BUN 13 05/26/2023   CREATININE 1.12 05/26/2023   CALCIUM  9.6 05/26/2023   EGFR 102 02/02/2023   GFRNONAA >60 05/26/2023      Latest Ref Rng & Units 05/26/2023   11:10 AM 02/02/2023     9:51 AM 04/01/2022    2:40 PM  BMP  Glucose 70 - 99 mg/dL 832  835  883   BUN 6 - 20 mg/dL 13  21  26    Creatinine 0.61 - 1.24 mg/dL 8.87  9.16  9.18   BUN/Creat Ratio 9 - 20  25    Sodium 135 - 145 mmol/L 140  143  141   Potassium 3.5 - 5.1 mmol/L 3.8  4.6  4.1   Chloride 98 - 111 mmol/L 106  105  109   CO2 22 - 32 mmol/L 25  19  26    Calcium  8.9 - 10.3 mg/dL 9.6  89.6  9.4       Latest Ref Rng & Units 05/26/2023   11:10 AM 04/01/2022    2:40 PM 05/17/2020    2:16 PM  CBC  WBC 4.0 - 10.5 K/uL 8.9  6.7  5.7   Hemoglobin 13.0 - 17.0 g/dL 85.2  85.9  85.0   Hematocrit 39.0 - 52.0 % 44.0  42.0  43.2   Platelets 150 - 400 K/uL 201  201  205    Lab Results  Component Value Date   HGBA1C 7.9 (A) 10/16/2023    Lab Results  Component Value Date   TSH 3.010 06/02/2022    ROS  Review of Systems  Cardiovascular:  Negative for chest pain, dyspnea on exertion and leg swelling.  Neurological:  Positive for focal weakness (right arm).    Physical Exam:   VS:  BP 138/85 (BP Location: Left Arm, Patient Position: Sitting, Cuff Size: Normal)   Pulse 72   Resp 16   Ht 5' 11 (1.803 m)   Wt 171 lb (77.6 kg)   SpO2 97%   BMI 23.85 kg/m        11/10/2023    8:21 AM 08/04/2023    1:53 PM 07/23/2023   11:02 AM  Vitals with BMI  Height 5' 11 5' 11 5' 11  Weight 171 lbs 165 lbs 6 oz 165 lbs 13 oz  BMI 23.86 23.08 23.13  Systolic 138 119 867  Diastolic 85 79 81  Pulse 72 70 83    Wt Readings from Last 3 Encounters:  11/10/23 171 lb (77.6 kg)  08/04/23 165 lb 6.4 oz (75 kg)  07/23/23 165 lb 12.8 oz (75.2 kg)    Physical Exam Neck:     Vascular: No carotid bruit or JVD.   Cardiovascular:     Rate and Rhythm: Normal rate and regular rhythm.     Pulses: Intact distal pulses.     Heart sounds: Normal heart sounds. No murmur heard.    No gallop.  Pulmonary:     Effort: Pulmonary effort is normal.     Breath sounds: Normal breath sounds.  Abdominal:     General: Bowel sounds  are normal.     Palpations: Abdomen is soft.   Musculoskeletal:     Right lower leg: No edema.     Left lower leg: No edema.    Studies Reviewed: SABRA     EKG:    EKG Interpretation Date/Time:  Tuesday November 10 2023 08:24:31 EDT Ventricular Rate:  74 PR Interval:  280 QRS Duration:  86 QT Interval:  414 QTC Calculation: 459 R Axis:   -26  Text Interpretation: EKG 11/10/2023: Sinus rhythm with first-degree AV block at the rate of 74 bpm, left anterior fascicular block.  Compared to 05/26/2023, minimal nonspecific ST changes in V1 and V2 not present and T wave flattening in the inferior leads not present. Confirmed by Crissie Aloi, Jagadeesh ([id number]) on 11/10/2023 8:40:17 AM    Medications ordered    Meds ordered this encounter  Medications   ezetimibe  (ZETIA ) 10 MG tablet    Sig: Take 1 tablet (10 mg total) by mouth daily.    Dispense:  90 tablet    Refill:  3   losartan  (COZAAR ) 25 MG tablet    Sig: Take 1 tablet (25 mg total) by mouth daily.    Dispense:  90 tablet    Refill:  3    ASSESSMENT AND PLAN: .      ICD-10-CM   1. Abnormal EKG  R94.31 EKG 12-Lead    Cardiac Stress Test: Informed Consent Details: Physician/Practitioner Attestation; Transcribe to consent form and obtain patient signature    losartan  (COZAAR ) 25 MG tablet    MYOCARDIAL PERFUSION IMAGING    2. Pure hypercholesterolemia  E78.00 ezetimibe  (ZETIA ) 10 MG tablet    MYOCARDIAL PERFUSION IMAGING    3. Type 2 diabetes mellitus with hyperglycemia, without long-term current use of insulin  (HCC)  E11.65 Cardiac Stress Test: Informed Consent Details: Physician/Practitioner Attestation; Transcribe to consent form and obtain patient signature     Assessment & Plan Abnormal EKG Cardiac evaluation required due to diabetes and associated risk factors. Pharmacologic stress test indicated due to inability to perform treadmill exercise post-spine surgery. - Order pharmacologic  nuclear stress test to assess cardiac  function. - Follow up with primary care physician for lab work and medication management. I will personally perform the test and if I find abnormalities,  will perform further evaluation. Otherwise unless new on ongoing symptoms(patient advised to contact us ), preventive  therapy is recommended. I will then see the patient on a PRN basis.   Type 2 diabetes mellitus Type 2 diabetes mellitus managed with Libre 3 for glucose monitoring. Occasional glucose spikes, generally not exceeding 250 mg/dL. No specific dietary triggers identified. - Continue current diabetes management with Libre 3. - Coordinate with diabetes counselor Herlene Morris for ongoing diabetes management. - Monitor blood glucose levels regularly. - Blood sugars are improving recently, previously hyperglycemia as a complication.  Essential hypertension Blood pressure slightly elevated for age and diabetic status. EKG suggests possible hypertension. Initiating losartan  for management and organ protection. - Start losartan  25 mg once daily for blood pressure management and organ protection. - Monitor blood pressure regularly. - Needs a follow-up BMP and this has been coordinated with his care team. - Goal blood pressure 130/80 mmHg or less, preferably 125/75 mmHg.  Hyperlipidemia Hyperlipidemia with LDL cholesterol at 101 mg/dL. Target LDL is less than 70 mg/dL. Initiating Zetia  for further LDL reduction. - Initiate Zetia  (ezetimibe ) to achieve further reduction in LDL cholesterol. - Avoid fried foods and limit red meat consumption. - Send prescription for Zetia  to the pharmacy downstairs. - Follow-up with lipid panel with his care team and this has been communicated already to both his PCP and diabetes coordinator.  Spine surgery Ongoing recovery from recent spine surgery. Scheduled for additional surgery on December 31, 2023, for right arm weakness and nerve issues under shoulder blades. - Coordinate pre-operative blood work in  early August, including cholesterol levels. - Communicate with primary care physician and diabetes counselor regarding upcoming surgery and necessary pre-operative evaluations. - Unless stress test is markedly abnormal, I will see the patient back on a as needed basis.  In the absence of significant symptoms of angina, would have a high threshold for invasive strategy.  Will send clearance letter to the surgeon once stress reports are available.   Signed,  Gordy Bergamo, MD, [facility] 11/10/2023, 6:31 PM [facility] [address] [place], [place] [address] Phone: [phone]. Fax:  [phone]  "

## 2023-11-10 ENCOUNTER — Ambulatory Visit: Attending: Cardiology | Admitting: Cardiology

## 2023-11-10 ENCOUNTER — Encounter: Payer: Self-pay | Admitting: Cardiology

## 2023-11-10 ENCOUNTER — Encounter: Payer: Self-pay | Admitting: *Deleted

## 2023-11-10 ENCOUNTER — Other Ambulatory Visit (HOSPITAL_COMMUNITY): Payer: Self-pay

## 2023-11-10 VITALS — BP 138/85 | HR 72 | Resp 16 | Ht 71.0 in | Wt 171.0 lb

## 2023-11-10 DIAGNOSIS — E7800 Pure hypercholesterolemia, unspecified: Secondary | ICD-10-CM | POA: Insufficient documentation

## 2023-11-10 DIAGNOSIS — R9431 Abnormal electrocardiogram [ECG] [EKG]: Secondary | ICD-10-CM | POA: Insufficient documentation

## 2023-11-10 DIAGNOSIS — E1165 Type 2 diabetes mellitus with hyperglycemia: Secondary | ICD-10-CM | POA: Diagnosis present

## 2023-11-10 MED ORDER — EZETIMIBE 10 MG PO TABS
10.0000 mg | ORAL_TABLET | Freq: Every day | ORAL | 3 refills | Status: DC
  Filled 2023-11-10: qty 90, 90d supply, fill #0

## 2023-11-10 MED ORDER — LOSARTAN POTASSIUM 25 MG PO TABS
25.0000 mg | ORAL_TABLET | Freq: Every day | ORAL | 3 refills | Status: DC
  Filled 2023-11-10: qty 90, 90d supply, fill #0

## 2023-11-10 NOTE — Patient Instructions (Addendum)
 Medication Instructions:  Your physician has recommended you make the following change in your medication: Start ezetimibe 10 mg by mouth daily  Start Losartan 25 mg by mouth daily   *If you need a refill on your cardiac medications before your next appointment, please call your pharmacy*  Lab Work: none If you have labs (blood work) drawn today and your tests are completely normal, you will receive your results only by: MyChart Message (if you have MyChart) OR A paper copy in the mail If you have any lab test that is abnormal or we need to change your treatment, we will call you to review the results.  Testing/Procedures: Your physician has requested that you have a lexiscan myoview. For further information please visit [email or web address] Please follow instruction sheet, as given.   Follow-Up: At [facility], you and your health needs are our priority.  As part of our continuing mission to provide you with exceptional heart care, our providers are all part of one team.  This team includes your primary Cardiologist (physician) and Advanced Practice Providers or APPs (Physician Assistants and Nurse Practitioners) who all work together to provide you with the care you need, when you need it.  Your next appointment:   As needed  Provider:   Gordy Bergamo, MD    We recommend signing up for the patient portal called MyChart.  Sign up information is provided on this After Visit Summary.  MyChart is used to connect with patients for Virtual Visits (Telemedicine).  Patients are able to view lab/test results, encounter notes, upcoming appointments, etc.  Non-urgent messages can be sent to your provider as well.   To learn more about what you can do with MyChart, go to [email or web address].   Other Instructions  [facility] at [facility] [address] [place], [place] [address] Phone: [phone]  November 10, 2023    Matthew Jennings DOB:  April 26, 1966 MRN: [id number] [address] [place] [address]   Dear Matthew Jennings,  You are scheduled for a Myocardial Perfusion Imaging Study on:   at .  Please arrive 15 minutes prior to your appointment time for registration and insurance purposes.  The test will take approximately 3 to 4 hours to complete; you may bring reading material.  If someone comes with you to your appointment, they will need to remain in the main lobby due to limited space in the testing area. **If you are pregnant or breastfeeding, please notify the nuclear lab prior to your appointment**  How to prepare for your Myocardial Perfusion Test: Do not eat or drink 3 hours prior to your test, except you may have water. Do not consume products containing caffeine (regular or decaffeinated) 12 hours prior to your test. (ex: coffee, chocolate, sodas, tea). Do bring a list of your current medications with you.  If not listed below, you may take your medications as normal. Do wear comfortable clothes (no dresses or overalls) and walking shoes, tennis shoes preferred (No heels or open toe shoes are allowed). Do NOT wear cologne, perfume, aftershave, or lotions (deodorant is allowed). If these instructions are not followed, your test will have to be rescheduled.  Please report to [address] ([facility] 2nd Floor, for your test.  If you have questions or concerns about your appointment, you can call the Nuclear Lab at [phone].  If you cannot keep your appointment, please provide 24 hours notification to the Nuclear Lab,  to avoid a possible $50 charge to your account.

## 2023-11-11 ENCOUNTER — Telehealth (HOSPITAL_COMMUNITY): Payer: Self-pay | Admitting: *Deleted

## 2023-11-11 ENCOUNTER — Encounter (HOSPITAL_COMMUNITY): Payer: Self-pay | Admitting: *Deleted

## 2023-11-11 ENCOUNTER — Other Ambulatory Visit: Payer: Self-pay | Admitting: Cardiology

## 2023-11-11 DIAGNOSIS — R9431 Abnormal electrocardiogram [ECG] [EKG]: Secondary | ICD-10-CM

## 2023-11-11 DIAGNOSIS — E7800 Pure hypercholesterolemia, unspecified: Secondary | ICD-10-CM

## 2023-11-11 NOTE — Telephone Encounter (Signed)
 Instructions for MPI sent via [employer]PS.

## 2023-11-17 ENCOUNTER — Telehealth: Payer: Self-pay | Admitting: Pharmacist

## 2023-11-17 ENCOUNTER — Other Ambulatory Visit: Payer: Self-pay

## 2023-11-17 ENCOUNTER — Ambulatory Visit: Attending: Family Medicine | Admitting: Pharmacist

## 2023-11-17 ENCOUNTER — Encounter: Payer: Self-pay | Admitting: Pharmacist

## 2023-11-17 DIAGNOSIS — E119 Type 2 diabetes mellitus without complications: Secondary | ICD-10-CM | POA: Diagnosis present

## 2023-11-17 DIAGNOSIS — Z7985 Long-term (current) use of injectable non-insulin antidiabetic drugs: Secondary | ICD-10-CM

## 2023-11-17 DIAGNOSIS — Z79899 Other long term (current) drug therapy: Secondary | ICD-10-CM | POA: Insufficient documentation

## 2023-11-17 DIAGNOSIS — Z7984 Long term (current) use of oral hypoglycemic drugs: Secondary | ICD-10-CM | POA: Diagnosis not present

## 2023-11-17 DIAGNOSIS — E785 Hyperlipidemia, unspecified: Secondary | ICD-10-CM | POA: Diagnosis not present

## 2023-11-17 DIAGNOSIS — Z794 Long term (current) use of insulin: Secondary | ICD-10-CM | POA: Diagnosis not present

## 2023-11-17 DIAGNOSIS — Z713 Dietary counseling and surveillance: Secondary | ICD-10-CM | POA: Diagnosis not present

## 2023-11-17 NOTE — Telephone Encounter (Signed)
 Can we start a PA for this patient's Trulicity ? He tells me today that when we increased the dose last month his insurance required a PA.

## 2023-11-17 NOTE — Progress Notes (Signed)
 S:     No chief complaint on file.  58 y.o. male who presents for diabetes evaluation, education, and management. We also have him enrolled in the LIBERATE study.  Patient arrives in good spirits. Presents for diabetes evaluation, education, and management. Patient was originally referred by Primary Care Provider on 06/22/23. Pharmacy has seen him several times since. We enrolled him in LIBERATE in March. His 3 month follow-up was last month. We increased Trulicity  at that visit but he was unable to get d/t it requiring a PA.   PMH includes T2D, Bell's palsy, HTN, HLD, stroke (2020)  Today he is doing well. Patient's diabetes is longstanding. He reports that he continues to take the Trulicity  1.5 mg dose. He tells me today that his insurance is requiring another PA with his dose increase. Denies any issues with nausea, vomiting, GI upset on the current dose. He endorses adherence to his Trulicity  and the Jardiance . He is enjoying wearing the freestyle libre sensors, and states he has been taking notes on which foods spike his blood sugars. He notes that sandwiches and starchy vegetables (creamed corn, potatoes, fruits) cause a spike in BG. He has transitioned from regular soda to zero sugar sodas. He has increased intake of dairy, drinking more milk.  Family/Social History:  FH: no pertinent positives Tobacco: never smoker Alcohol: occasional use   Insurance coverage/medication affordability: Medicaid + Medicare  Current diabetes medications include: Jardiance  25 mg daily, Trulicity  3 mg weekly (continues to take the 1.5 mg dose) -Intolerance to Ozempic  2 mg - endorses too much weight loss.  -Reports constipation, bloating, and migraines with metformin  IR.  Patient denies nocturia.  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 Stable. Re-establishing with podiatry. Pregabalin  does provide relief. Patient denies any  vision changes since last visit.  . Patient reports self foot exams.    Patient reported dietary habits: Reports that he recently reduced intake of sugary fruits.  Exercise habits: yard work, house work    O:  Date of Download: 11/17/2023, 4 week report % Time CGM is active: 97% Average Glucose: 155 mg/dL Glucose Management Indicator: 7% Glucose Variability: 24.8 (goal <36%) Time in Goal:  - Time in range 70-180: 78% - Time above range: 22% - Time below range: 0%  Lab Results  Component Value Date   HGBA1C 7.9 (A) 10/16/2023   There were no vitals filed for this visit.  Lipid Panel     Component Value Date/Time   CHOL 174 02/02/2023 0951   TRIG 109 02/02/2023 0951   HDL 53 02/02/2023 0951   CHOLHDL 3.3 02/02/2023 0951   LDLCALC 101 (H) 02/02/2023 0951    Clinical Atherosclerotic Cardiovascular Disease (ASCVD): Yes  The ASCVD Risk score (Arnett DK, et al., 2019) failed to calculate for the following reasons:   Risk score cannot be calculated because patient has a medical history suggesting prior/existing ASCVD   Patient is participating in a Managed Medicaid Plan:  Yes   A/P: Diabetes longstanding. A1c of 7.9% last month is above goal < 7.0%. However, his CGM report shows good improvement since last visit. We will work with pharmacy to get the PA approved for the 3mg  weekly dose. Since on Trulicity , will continue to monitor for undesirable weight loss. Patient is able to verbalize appropriate hypoglycemia management plan but is asymptomatic today. Medication adherence appears suboptimal due to issues with getting medication from pharmacy due to coverage. Patient amenable  to increasing Trulicity  dose once we can get approval.  - Continue Trulicity  1.5 mg weekly. Increase Trulicity  to 3 mg once approved.  - Continue Jardiance  25 mg daily - Educated on [facility] sensor application and usage. Recommend to monitor blood glucose continuously. Encouraged him to call if he has any issues  with FL3 sensors.  - Extensively discussed pathophysiology of diabetes, recommended lifestyle interventions, dietary effects on blood sugar control - Counseled on s/sx of and management of hypoglycemia - Next A1C anticipated 01/2024  Hyperlipidemia/ASCVD Risk Reduction: - Currently uncontrolled with LDL-C above goal. Coordinating with his Cardiologuist, Dr. Ladona. I was instructed to get lipid panel in another 4-6 weeks but mistakenly got it today. We will plan on recheck next month.  - Continue atorvastatin  40 mg daily  - Continue Zetia  10 mg daily - Lipid panel in 4 weeks.   Written patient instructions provided.  Total time in face to face counseling 45 minutes.    Follow-up: Pharmacist: 1 month on 11/17/23 PCP: None scheduled  Josefa Range, PharmD PGY1 Pharmacy Resident

## 2023-11-18 ENCOUNTER — Ambulatory Visit: Payer: Self-pay | Admitting: Family

## 2023-11-18 DIAGNOSIS — E785 Hyperlipidemia, unspecified: Secondary | ICD-10-CM

## 2023-11-18 LAB — LIPID PANEL
Chol/HDL Ratio: 2.3 ratio (ref 0.0–5.0)
Cholesterol, Total: 94 mg/dL — ABNORMAL LOW (ref 100–199)
HDL: 41 mg/dL (ref >39)
LDL Chol Calc (NIH): 40 mg/dL (ref 0–99)
Triglycerides: 55 mg/dL (ref 0–149)
VLDL Cholesterol Cal: 13 mg/dL (ref 5–40)

## 2023-11-18 MED ORDER — ATORVASTATIN CALCIUM 40 MG PO TABS: 40.0000 mg | ORAL_TABLET | Freq: Every day | ORAL | 0 refills | Status: DC

## 2023-11-18 NOTE — Progress Notes (Signed)
 Lipids under excellent control and continue present management

## 2023-11-19 ENCOUNTER — Ambulatory Visit (HOSPITAL_COMMUNITY)
Admission: RE | Admit: 2023-11-19 | Discharge: 2023-11-19 | Disposition: A | Discharge status: Home / Self Care | Source: Ambulatory Visit | Attending: Cardiology | Admitting: Cardiology

## 2023-11-19 DIAGNOSIS — E7800 Pure hypercholesterolemia, unspecified: Secondary | ICD-10-CM

## 2023-11-19 DIAGNOSIS — R9431 Abnormal electrocardiogram [ECG] [EKG]: Secondary | ICD-10-CM

## 2023-11-23 ENCOUNTER — Ambulatory Visit: Payer: Self-pay | Admitting: Cardiology

## 2023-11-23 ENCOUNTER — Ambulatory Visit (HOSPITAL_COMMUNITY)
Admission: RE | Admit: 2023-11-23 | Discharge: 2023-11-23 | Disposition: A | Discharge status: Home / Self Care | Source: Ambulatory Visit | Attending: Internal Medicine | Admitting: Internal Medicine

## 2023-11-23 ENCOUNTER — Encounter: Payer: Self-pay | Admitting: Cardiology

## 2023-11-23 DIAGNOSIS — R9431 Abnormal electrocardiogram [ECG] [EKG]: Secondary | ICD-10-CM | POA: Insufficient documentation

## 2023-11-23 DIAGNOSIS — E7800 Pure hypercholesterolemia, unspecified: Secondary | ICD-10-CM | POA: Diagnosis present

## 2023-11-23 LAB — MYOCARDIAL PERFUSION IMAGING
LV dias vol: 88 mL (ref 62–150)
LV sys vol: 28 mL (ref 4.2–5.8)
Nuc Stress EF: 68 %
Peak HR: 84 {beats}/min
Rest HR: 70 {beats}/min
Rest Nuclear Isotope Dose: 9.5 mCi
SDS: 2
SRS: 14
SSS: 11
ST Depression (mm): 0 mm
Stress Nuclear Isotope Dose: 30.8 mCi
TID: 1.02

## 2023-11-23 MED ORDER — TECHNETIUM TC 99M TETROFOSMIN IV KIT
30.8000 | PACK | Freq: Once | INTRAVENOUS | Status: AC | PRN
  Administered 2023-11-23: 30.8 via INTRAVENOUS

## 2023-11-23 MED ORDER — REGADENOSON 0.4 MG/5ML IV SOLN
0.4000 mg | Freq: Once | INTRAVENOUS | Status: AC
  Administered 2023-11-23: 0.4 mg via INTRAVENOUS

## 2023-11-23 MED ORDER — REGADENOSON 0.4 MG/5ML IV SOLN
INTRAVENOUS | Status: AC
  Filled 2023-11-23: qty 5

## 2023-11-23 MED ORDER — TECHNETIUM TC 99M TETROFOSMIN IV KIT
9.5000 | PACK | Freq: Once | INTRAVENOUS | Status: AC | PRN
  Administered 2023-11-23: 9.5 via INTRAVENOUS

## 2023-11-23 NOTE — Progress Notes (Signed)
 I sent surgical clearance letter to Dorn Ned, MD (neurosurgery). NOrmal stress test

## 2023-11-26 ENCOUNTER — Ambulatory Visit: Payer: Self-pay | Admitting: Cardiology

## 2023-11-26 DIAGNOSIS — R911 Solitary pulmonary nodule: Secondary | ICD-10-CM

## 2023-11-26 NOTE — Progress Notes (Signed)
 Your stress test is completely normal, hence low risk from cardiac standpoint.  There is a left lower lobe nodule in your lungs, that is at least 1.3 cm and needs further evaluation, I have sent a note to your PCP to order further testing and follow-up.

## 2023-12-14 ENCOUNTER — Telehealth: Payer: Self-pay | Admitting: Family

## 2023-12-14 NOTE — Telephone Encounter (Signed)
 Called pt to confirm appt for 8/5 LVM

## 2023-12-15 ENCOUNTER — Ambulatory Visit: Attending: Family Medicine | Admitting: Pharmacist

## 2023-12-15 ENCOUNTER — Encounter: Payer: Self-pay | Admitting: Pharmacist

## 2023-12-15 DIAGNOSIS — E785 Hyperlipidemia, unspecified: Secondary | ICD-10-CM | POA: Diagnosis not present

## 2023-12-15 DIAGNOSIS — Z7985 Long-term (current) use of injectable non-insulin antidiabetic drugs: Secondary | ICD-10-CM | POA: Diagnosis not present

## 2023-12-15 DIAGNOSIS — E1140 Type 2 diabetes mellitus with diabetic neuropathy, unspecified: Secondary | ICD-10-CM | POA: Insufficient documentation

## 2023-12-15 DIAGNOSIS — E119 Type 2 diabetes mellitus without complications: Secondary | ICD-10-CM | POA: Diagnosis not present

## 2023-12-15 DIAGNOSIS — I10 Essential (primary) hypertension: Secondary | ICD-10-CM | POA: Diagnosis not present

## 2023-12-15 DIAGNOSIS — Z7984 Long term (current) use of oral hypoglycemic drugs: Secondary | ICD-10-CM | POA: Diagnosis not present

## 2023-12-15 MED ORDER — TRULICITY 1.5 MG/0.5ML ~~LOC~~ SOAJ: 1.5000 mg | SUBCUTANEOUS | 1 refills | Status: DC

## 2023-12-15 NOTE — Progress Notes (Signed)
 S:     No chief complaint on file.  58 y.o. male who presents for diabetes evaluation, education, and management. We also have him enrolled in the LIBERATE study. Of note, I saw him last month. GMI shows good improvement. We increased Trulicity .  Today, pt arrives in good spirits. Presents for diabetes evaluation, education, and management. Patient was originally referred by Primary Care Provider on 06/22/23. Pharmacy has seen him several times since. We enrolled him in LIBERATE in March. His 3 month follow-up was in June.   PMH includes T2D, Bell's palsy, HTN, HLD.   Today, he is doing well overall. Patient's diabetes is longstanding. He reports that he increased Trulicity  to the 3 mg dose. He denies nausea, vomiting. However, endorses increase reflux and indigestion. Requesting to go back to the 1.5 mg dose.  He endorses adherence to his Trulicity  and the Jardiance . He is enjoying wearing the freestyle libre sensors, and states he has been taking notes on which foods spike his blood sugars. He notes that sandwiches and starchy vegetables (creamed corn, potatoes, fruits) cause a spike in BG. He has transitioned from regular soda to zero sugar sodas. He has increased intake of dairy, drinking more milk due to acid reflux.  Family/Social History:  FH: no pertinent positives Tobacco: never smoker Alcohol: occasional use   Insurance coverage/medication affordability: Medicaid + Medicare  Current diabetes medications include: Jardiance  25 mg daily, Trulicity  3mg  weekly -Intolerance to Ozempic  2 mg - endorses too much weight loss.  -Reports constipation, bloating, and migraines with metformin  IR.  Patient denies nocturia.  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 Stable. Re-establishing with podiatry. Pregabalin  does provide relief. Patient denies any vision changes since last visit.  . Patient reports self foot exams.     Patient reported dietary habits: Reports that he recently reduced intake of sugary fruits.  Exercise habits: yard work, house work    O:  Date of Download: 12/15/2023, 4 week report % Time CGM is active: 97% Average Glucose: 160 mg/dL Glucose Management Indicator: 7.4% Glucose Variability: 22.7 (goal <36%) Time in Goal:  - Time in range 70-180: 60% - Time above range: 40% - Time below range: 0%  Lab Results  Component Value Date   HGBA1C 7.9 (A) 10/16/2023   There were no vitals filed for this visit.  Lipid Panel     Component Value Date/Time   CHOL 94 (L) 11/17/2023 1117   TRIG 55 11/17/2023 1117   HDL 41 11/17/2023 1117   CHOLHDL 2.3 11/17/2023 1117   LDLCALC 40 11/17/2023 1117    Clinical Atherosclerotic Cardiovascular Disease (ASCVD): Yes  The ASCVD Risk score (Arnett DK, et al., 2019) failed to calculate for the following reasons:   Risk score cannot be calculated because patient has a medical history suggesting prior/existing ASCVD   Patient is participating in a Managed Medicaid Plan:  Yes   A/P: Diabetes longstanding. A1c of 7.9% in June above goal < 7.0%. However, his CGM report shows good improvement overall. We will decrease Trulicity  dose d/t GI side effects and keep him on the 1.5 mg weekly dose. Since on Trulicity , will continue to monitor for undesirable weight loss. Patient is able to verbalize appropriate hypoglycemia management plan but is asymptomatic today. Medication adherence appears to be optimal.  - Decrease Trulicity  to 1.5 mg weekly. - Continue Jardiance  25 mg daily - Educated on [facility] sensor application and usage. Recommend to monitor  blood glucose continuously. Encouraged him to call if he has any issues with FL3 sensors.  - Extensively discussed pathophysiology of diabetes, recommended lifestyle interventions, dietary effects on blood sugar control - Counseled on s/sx of and management of hypoglycemia - Next A1C anticipated 01/2024  Written  patient instructions provided.  Total time in face to face counseling 45 minutes.    Follow-up: Pharmacist: September for Liberate 6 month PCP: None scheduled  Herlene Fleeta Morris, PharmD, [place], CPP Clinical Pharmacist [facility] [phone]

## 2024-01-28 ENCOUNTER — Telehealth: Payer: Self-pay | Admitting: Pharmacist

## 2024-01-28 ENCOUNTER — Encounter: Payer: Self-pay | Admitting: Pharmacist

## 2024-01-28 ENCOUNTER — Ambulatory Visit: Attending: Family | Admitting: Pharmacist

## 2024-01-28 ENCOUNTER — Ambulatory Visit: Payer: Self-pay | Admitting: Family

## 2024-01-28 DIAGNOSIS — E119 Type 2 diabetes mellitus without complications: Secondary | ICD-10-CM

## 2024-01-28 DIAGNOSIS — Z7985 Long-term (current) use of injectable non-insulin antidiabetic drugs: Secondary | ICD-10-CM

## 2024-01-28 DIAGNOSIS — Z7984 Long term (current) use of oral hypoglycemic drugs: Secondary | ICD-10-CM

## 2024-01-28 LAB — POCT GLYCOSYLATED HEMOGLOBIN (HGB A1C): HbA1c, POC (controlled diabetic range): 7.6 % — AB (ref 0.0–7.0)

## 2024-01-28 MED ORDER — FREESTYLE LIBRE 3 PLUS SENSOR MISC: 3 refills | Status: DC

## 2024-01-28 NOTE — Progress Notes (Signed)
 S:     No chief complaint on file.  58 y.o. male who presents for diabetes evaluation, education, and management in the context of the LIBERATE Study. Today, patient arrives in good spirits and presents without any assistance.   Patient arrives in good spirits. Presents for diabetes evaluation, education, and management. Patient was originally referred by Primary Care Provider on 06/22/23. Pharmacy has seen him several times since. We enrolled him in LIBERATE in March. Today is his 6 month follow-up.  PMH includes T2D, Bell's palsy, HTN, HLD, stroke (2020)   Today he is doing well. Patient's diabetes is longstanding. He reports that he continues to take the Trulicity  1.5 mg dose. Denies any issues with nausea, vomiting on the current dose. Does have some dyspepsia but is manageable. He endorses adherence to his Trulicity  and the Jardiance .   Family/Social History:  FH: no pertinent positives Tobacco: never smoker Alcohol: occasional use   Current diabetes medications include: Trulicity  1.5 mg weekly, Jardiance  25 mg daily  Patient reports adherence to taking all medications as prescribed.   Insurance coverage: Medicaid + Medicare  Patient reports hypoglycemic readings but no symptoms. Sleeps on his left side and lows occur overnight.   CGM Study Study visit: 6 Month Follow Up Visit  CGM Data Download date: 01/28/2024 % Time CGM Is Active: 98 % Average glucose (mg/dL): 824 mg/dL Glucose Management Indicator (%): 7.5 % Glucose Variability (%): 29.4 % Time Above Range >180 mg/dL (%): 39 % Time in Range 70-180 mg/dL (%): 61 % Time Below Range <70 mg/dL (%): 0 %  Diabetes Distress Scale Feeling like diabetes is taking up too much of my mental and physical energy every day.: Not a problem Feeling that my doctor doesn't know enough about diabetes and diabetic care. : Not a problem Feeling angry, scared, and/or depressed when I think about living with diabetes : A slight  problem Feeling that my doctor doesn't give my clear directions on how to manage my diabetes. : Not a problem Feeling that im not testing my blood sugars frequently enough.: A slight problem Feeling that I'm often failing with my diabetes routine: Not a problem Feelling that friends and family are not supportive enough of self care efforts.: A moderate problem Feeling that diabetes controls my life.: Not a problem Feeling that my doctor doesn't take my concerns seriously enough: Not a problem Not feeling confident in my day to day ability to manage diabetes: A slight problem Feeling that I will end up with serious long term complications no matter what I do.: A moderate problem Feeling that I am not sticking closely enough to a good meal plan.: Not a problem Feeling that friends or family don't appreciate how difficult living with diabetes can be. : A moderate problem Feeling overwhelmed by the demands of living with diabetes.: Not a problem Feeling that I don't have a doctor who I can see.: Not a problem Not feeling motivated to keep up my diabetes self management.: A moderate problem Feeling that friends or family don't give me the emotional support that I would like. : A moderate problem DDS17 Score: 30 Emotional Burden Score: 1.6 Physician related distress score: 1 Regimen Related Distress score : 1.8 Interpersonal distress score: 3   Patient denies nocturia.  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 Stable. Re-establishing with podiatry. Pregabalin  does provide relief. Patient denies any vision changes since last visit.  . Patient reports  self foot exams.     Patient reported dietary habits: Reports that he recently reduced intake of sugary fruits.   Exercise habits: yard work, house work   O:  Ambulance person today for a 7 day report % Time CGM is active: 86% Average Glucose: 144 mg/dL Glucose  Management Indicator: 6.8  Glucose Variability: 28.2% (goal <36%) Time in Goal:  - Time in range 70-180: 78% - Time above range: 18% - Time below range: 4% Observed patterns: -Lows occur: 12a-3a and then again 6p-9p -Spikes occur infrequently but most often occur between 8a-12p if they do occur   Lab Results  Component Value Date   HGBA1C 7.6 (A) 01/28/2024   There were no vitals filed for this visit.  Lipid Panel     Component Value Date/Time   CHOL 94 (L) 11/17/2023 1117   TRIG 55 11/17/2023 1117   HDL 41 11/17/2023 1117   CHOLHDL 2.3 11/17/2023 1117   LDLCALC 40 11/17/2023 1117   Clinical Atherosclerotic Cardiovascular Disease (ASCVD): Yes  The ASCVD Risk score (Arnett DK, et al., 2019) failed to calculate for the following reasons:   Risk score cannot be calculated because patient has a medical history suggesting prior/existing ASCVD   Patient is participating in a Managed Medicaid Plan: No    A/P:  LIBERATE Study:  - Discussed options for obtaining continued supply of Libre 3 sensors.   Diabetes longstanding currently above goal based on A1c, however, GMI on CGM AGP report is at goal. Patient is able to verbalize appropriate hypoglycemia management plan - I believe his lows are pressure lows and false due to him sleeping on the same side his sensor is on. Will not change any medication at this time. Medication adherence appears to be optimal.  -Continued current regimen. -Patient educated on purpose, proper use, and potential adverse effects of Trulicity , Jardiance .  -Extensively discussed pathophysiology of diabetes, recommended lifestyle interventions, dietary effects on blood sugar control.  -Counseled on s/sx of and management of hypoglycemia.  -Next A1c anticipated 04/2024.   Written patient instructions provided. Patient verbalized understanding of treatment plan.  Total time in face to face counseling 30 minutes.    Follow-up:  Pharmacist in 6-8  weeks.  Herlene Fleeta Morris, PharmD, JAQUELINE, CPP Clinical Pharmacist [facility] [phone]

## 2024-01-28 NOTE — Telephone Encounter (Signed)
 Can we attempt a PA for the [place] 3 plus sensors?

## 2024-02-05 ENCOUNTER — Ambulatory Visit (INDEPENDENT_AMBULATORY_CARE_PROVIDER_SITE_OTHER): Admitting: Pulmonary Disease

## 2024-02-05 ENCOUNTER — Encounter: Payer: Self-pay | Admitting: Pulmonary Disease

## 2024-02-05 VITALS — BP 129/74 | HR 78 | Ht 70.0 in | Wt 171.0 lb

## 2024-02-05 DIAGNOSIS — Z7722 Contact with and (suspected) exposure to environmental tobacco smoke (acute) (chronic): Secondary | ICD-10-CM

## 2024-02-05 DIAGNOSIS — R911 Solitary pulmonary nodule: Secondary | ICD-10-CM | POA: Diagnosis not present

## 2024-02-05 DIAGNOSIS — R053 Chronic cough: Secondary | ICD-10-CM

## 2024-02-05 NOTE — Progress Notes (Signed)
 Subjective:   PATIENT ID: Matthew Jennings GENDER: male DOB: February 13, 1966, MRN: [id number]   HPI Discussed the use of AI scribe software for clinical note transcription with the patient, who gave verbal consent to proceed.  History of Present Illness   Matthew Jennings is a 58 year old male, never smoker who presents with a lung nodule. He was referred for evaluation of a lung nodule found incidentally on a heart scan.  A 1.3 cm nodule is present in the left lower lobe of the lung, identified during a heart scan. He has a persistent cough since July, with no mucus production, shortness of breath, or wheezing. There are no changes in appetite or weight loss. He has significant secondhand smoke exposure from childhood and work as a bar back. He has no exposure to vaping or other inhaled substances. He is currently disabled, with a past occupation as a Theme park manager. Family history includes his mother having breast cancer with metastasis.       Past Medical History:  Diagnosis Date   Arthritis    Bell's palsy    left   DM (diabetes mellitus) (HCC)    Type 2   Facial paralysis/Bells palsy    Headache    Migraines   Hx of migraines    Stroke (HCC) 2020   Pt states it was a Palsy episode but due to increase in symptoms, classified it as mild stroke leaving pt with current spine/nerve/strength problems     Family History  Problem Relation Age of Onset   Cancer Mother    Alzheimer's disease Father      Social History   Socioeconomic History   Marital status: Single    Spouse name: Not on file   Number of children: 0   Years of education: Not on file   Highest education level: Associate degree: occupational, Scientist, product/process development, or vocational program  Occupational History    Comment: EMT  Tobacco Use   Smoking status: Never    Passive exposure: Yes   Smokeless tobacco: Never  Vaping Use   Vaping status: Never Used  Substance and Sexual Activity   Alcohol use: Yes    Comment:  occasionally   Drug use: Not Currently   Sexual activity: Not Currently  Other Topics Concern   Not on file  Social History Narrative   Lives alone   Social Drivers of Health   Financial Resource Strain: High Risk (03/23/2023)   Overall Financial Resource Strain (CARDIA)    Difficulty of Paying Living Expenses: Very hard  Food Insecurity: Food Insecurity Present (03/23/2023)   Hunger Vital Sign    Worried About Running Out of Food in the Last Year: Often true    Ran Out of Food in the Last Year: Often true  Transportation Needs: No Transportation Needs (03/23/2023)   PRAPARE - Administrator, Civil Service (Medical): No    Lack of Transportation (Non-Medical): No  Physical Activity: Sufficiently Active (03/23/2023)   Exercise Vital Sign    Days of Exercise per Week: 5 days    Minutes of Exercise per Session: 30 min  Stress: Stress Concern Present (03/23/2023)   [employer] of Occupational Health - Occupational Stress Questionnaire    Feeling of Stress : Very much  Social Connections: Socially Isolated (03/23/2023)   Social Connection and Isolation Panel    Frequency of Communication with Friends and Family: More than three times a week    Frequency of Social  Gatherings with Friends and Family: Three times a week    Attends Religious Services: Never    Active Member of Clubs or Organizations: No    Attends Banker Meetings: Never    Marital Status: Never married  Intimate Partner Violence: Not At Risk (03/23/2023)   Humiliation, Afraid, Rape, and Kick questionnaire    Fear of Current or Ex-Partner: No    Emotionally Abused: No    Physically Abused: No    Sexually Abused: No     Allergies  Allergen Reactions   Penicillins Anaphylaxis   Aspirin Other (See Comments)    Lyme Disease      Outpatient Medications Prior to Visit  Medication Sig Dispense Refill   atorvastatin  (LIPITOR) 40 MG tablet Take 1 tablet (40 mg total) by mouth daily. 90  tablet 0   Continuous Glucose Sensor (FREESTYLE LIBRE 3 PLUS SENSOR) MISC Change sensor every 15 days. 2 each 3   Dulaglutide  (TRULICITY ) 1.5 MG/0.5ML SOAJ Inject 1.5 mg into the skin once a week. 6 mL 1   empagliflozin  (JARDIANCE ) 25 MG TABS tablet Take 1 tablet (25 mg total) by mouth daily before breakfast. 90 tablet 2   ezetimibe  (ZETIA ) 10 MG tablet Take 1 tablet (10 mg total) by mouth daily. 90 tablet 3   losartan  (COZAAR ) 25 MG tablet Take 1 tablet (25 mg total) by mouth daily. 90 tablet 3   pregabalin  (LYRICA ) 25 MG capsule Take 1 capsule (25 mg total) by mouth 2 (two) times daily. 60 capsule 0   Rimegepant Sulfate (NURTEC) 75 MG TBDP Take 1 tablet (75 mg total) by mouth as needed. 16 tablet 11   No facility-administered medications prior to visit.    Review of Systems  Constitutional:  Negative for chills, fever, malaise/fatigue and weight loss.  HENT:  Negative for congestion, sinus pain and sore throat.   Eyes: Negative.   Respiratory:  Positive for cough. Negative for hemoptysis, sputum production, shortness of breath and wheezing.   Cardiovascular:  Negative for chest pain, palpitations, orthopnea, claudication and leg swelling.  Gastrointestinal:  Negative for abdominal pain, heartburn, nausea and vomiting.  Genitourinary: Negative.   Musculoskeletal:  Negative for joint pain and myalgias.  Skin:  Negative for rash.  Neurological:  Negative for weakness.  Endo/Heme/Allergies: Negative.   Psychiatric/Behavioral: Negative.     Objective:   Vitals:   02/05/24 0846  BP: 129/74  Pulse: 78  SpO2: 98%  Weight: 171 lb (77.6 kg)  Height: 5' 10 (1.778 m)   Physical Exam Constitutional:      General: He is not in acute distress.    Appearance: Normal appearance.  Eyes:     General: No scleral icterus.    Conjunctiva/sclera: Conjunctivae normal.  Cardiovascular:     Rate and Rhythm: Normal rate and regular rhythm.  Pulmonary:     Breath sounds: No wheezing, rhonchi or  rales.  Musculoskeletal:     Right lower leg: No edema.     Left lower leg: No edema.  Skin:    General: Skin is warm and dry.  Neurological:     General: No focal deficit present.     CBC    Component Value Date/Time   WBC 8.9 05/26/2023 1110   RBC 5.24 05/26/2023 1110   HGB 14.7 05/26/2023 1110   HCT 44.0 05/26/2023 1110   PLT 201 05/26/2023 1110   MCV 84.0 05/26/2023 1110   MCH 28.1 05/26/2023 1110   MCHC 33.4 05/26/2023 1110  RDW 12.9 05/26/2023 1110   LYMPHSABS 1.2 04/01/2022 1440   MONOABS 0.5 04/01/2022 1440   EOSABS 0.1 04/01/2022 1440   BASOSABS 0.0 04/01/2022 1440      Latest Ref Rng & Units 05/26/2023   11:10 AM 02/02/2023    9:51 AM 04/01/2022    2:40 PM  BMP  Glucose 70 - 99 mg/dL 832  835  883   BUN 6 - 20 mg/dL 13  21  26    Creatinine 0.61 - 1.24 mg/dL 8.87  9.16  9.18   BUN/Creat Ratio 9 - 20  25    Sodium 135 - 145 mmol/L 140  143  141   Potassium 3.5 - 5.1 mmol/L 3.8  4.6  4.1   Chloride 98 - 111 mmol/L 106  105  109   CO2 22 - 32 mmol/L 25  19  26    Calcium  8.9 - 10.3 mg/dL 9.6  89.6  9.4    Chest imaging: Myocardial Perfusion Scan 11/23/23 13 mm left lower lobe pulmonary nodule, suggest follow-up dedicated chest CT for further evaluation.  PFT:     No data to display          Labs:  Path:  Echo:  Heart Catheterization:    Assessment & Plan:   Lung nodule - Plan: CT Chest Wo Contrast Assessment and Plan    Solitary pulmonary nodule, left lower lobe 1.3 cm nodule in left lower lobe identified. Differential includes benign and malignant causes. Low malignancy risk but requires monitoring due to secondhand smoke exposure. - Order dedicated CT scan of chest to evaluate nodule and check for additional nodules. - Schedule follow-up visit in six months, contingent on CT scan results. - Instruct him to report symptoms such as worsening cough, dyspnea, hemoptysis, appetite loss, or weight loss.  Chronic cough Chronic cough since  July, coinciding with lung nodule discovery. No mucus, dyspnea, or wheezing. Significant secondhand smoke exposure. - Monitor symptoms and reassess if cough worsens or new symptoms develop.     Follow up in 6 months  Dorn Chill, MD [facility] Office: [phone]   Current Outpatient Medications:    atorvastatin  (LIPITOR) 40 MG tablet, Take 1 tablet (40 mg total) by mouth daily., Disp: 90 tablet, Rfl: 0   Continuous Glucose Sensor (FREESTYLE LIBRE 3 PLUS SENSOR) MISC, Change sensor every 15 days., Disp: 2 each, Rfl: 3   Dulaglutide  (TRULICITY ) 1.5 MG/0.5ML SOAJ, Inject 1.5 mg into the skin once a week., Disp: 6 mL, Rfl: 1   empagliflozin  (JARDIANCE ) 25 MG TABS tablet, Take 1 tablet (25 mg total) by mouth daily before breakfast., Disp: 90 tablet, Rfl: 2   ezetimibe  (ZETIA ) 10 MG tablet, Take 1 tablet (10 mg total) by mouth daily., Disp: 90 tablet, Rfl: 3   losartan  (COZAAR ) 25 MG tablet, Take 1 tablet (25 mg total) by mouth daily., Disp: 90 tablet, Rfl: 3   pregabalin  (LYRICA ) 25 MG capsule, Take 1 capsule (25 mg total) by mouth 2 (two) times daily., Disp: 60 capsule, Rfl: 0   Rimegepant Sulfate (NURTEC) 75 MG TBDP, Take 1 tablet (75 mg total) by mouth as needed., Disp: 16 tablet, Rfl: 11

## 2024-02-05 NOTE — Patient Instructions (Addendum)
 You have a small spot on the lung in the left lower lobe  We will check a CT Chest scan in October. Once we have these results we will plan to monitor this spot on the lung with follow up scans.  Follow up in 6 months in person or via video visit

## 2024-02-15 ENCOUNTER — Telehealth: Payer: Self-pay | Admitting: Pharmacist

## 2024-02-15 NOTE — Telephone Encounter (Signed)
 Pharmacy Patient Advocate Encounter  Received notification from [facility] that Prior Authorization for NURTEC 75 MG PO TBDP has been APPROVED from 02/15/2024 to 02/14/2025   PA #/Case ID/Reference #: [id number]

## 2024-02-15 NOTE — Telephone Encounter (Signed)
 Clinical questions have been answered and PA submitted. PA currently Pending. Please be advised that most companies allow up to 30 days to make a decision. We will advise when a determination has been made, or follow up in 1 week.   Please reach out to our team, Rx Prior Auth Pool, if you haven't heard back in a week.

## 2024-02-15 NOTE — Telephone Encounter (Signed)
 Pharmacy Patient Advocate Encounter   Received notification from CoverMyMeds that prior authorization for Nurtec 75MG  dispersible tablets is required/requested.   Insurance verification completed.   The patient is insured through [facility].   Per test claim: PA required; PA started via CoverMyMeds. KEY [id number] . Waiting for clinical questions to populate.

## 2024-02-20 ENCOUNTER — Ambulatory Visit (HOSPITAL_BASED_OUTPATIENT_CLINIC_OR_DEPARTMENT_OTHER)
Admission: RE | Admit: 2024-02-20 | Discharge: 2024-02-20 | Disposition: A | Discharge status: Home / Self Care | Source: Ambulatory Visit | Attending: Pulmonary Disease | Admitting: Pulmonary Disease

## 2024-02-20 DIAGNOSIS — R911 Solitary pulmonary nodule: Secondary | ICD-10-CM | POA: Insufficient documentation

## 2024-02-23 ENCOUNTER — Ambulatory Visit: Admitting: Adult Health

## 2024-02-23 ENCOUNTER — Encounter: Payer: Self-pay | Admitting: Adult Health

## 2024-02-23 VITALS — BP 127/85 | HR 71 | Ht 70.0 in | Wt 172.0 lb

## 2024-02-23 DIAGNOSIS — Z7985 Long-term (current) use of injectable non-insulin antidiabetic drugs: Secondary | ICD-10-CM

## 2024-02-23 DIAGNOSIS — Z9889 Other specified postprocedural states: Secondary | ICD-10-CM

## 2024-02-23 DIAGNOSIS — G43109 Migraine with aura, not intractable, without status migrainosus: Secondary | ICD-10-CM

## 2024-02-23 DIAGNOSIS — M4802 Spinal stenosis, cervical region: Secondary | ICD-10-CM | POA: Diagnosis not present

## 2024-02-23 DIAGNOSIS — G5623 Lesion of ulnar nerve, bilateral upper limbs: Secondary | ICD-10-CM

## 2024-02-23 DIAGNOSIS — Z8669 Personal history of other diseases of the nervous system and sense organs: Secondary | ICD-10-CM

## 2024-02-23 DIAGNOSIS — E1142 Type 2 diabetes mellitus with diabetic polyneuropathy: Secondary | ICD-10-CM

## 2024-02-23 DIAGNOSIS — R2689 Other abnormalities of gait and mobility: Secondary | ICD-10-CM

## 2024-02-23 NOTE — Progress Notes (Signed)
 [facility]  PATIENT: Matthew Jennings DOB: 10/18/1965  REFERRING CLINICIAN: Lorren Greig PARAS, NP HISTORY FROM: patient  REASON FOR VISIT: Bell's Palsy, left-sided numbness   HISTORICAL  CHIEF COMPLAINT:  Chief Complaint  Patient presents with   Migraine    Rm 8 alone Pt is well, reports ongoing numbness and weakness in extremities. L sided facial twitching.     HISTORY OF PRESENT ILLNESS:    Update 02/23/2024 JM: Patient returns for follow-up visit. Overall has been stable since prior visit. Reports continued improvement of LUE numbness and weakness, numbness only occasionally and able to function more with left hand. Did undergo right ulnar release in July with improvement of numbness and hand movement but still unable to make a full fist and still with some weakness. Continued right shoulder pain, questionable rotator cuff injury.  Continued gait impairment with mild imbalance, denies worsening.  He has follow-up with Dr. Debby next week and plans on initiating therapy.  Reports occasional left facial numbness and twitches at the corner of his eye and mouth, not new, does not last long.  Continue lower extremity neuropathy but feels this improved some after ACDF, still with some numbness but able to feel more, doesn't feel like feet are concrete. Overall migraines stable, about 1-2 per month, never received Nurtec prescription, has been taking amitriptyline  as needed with onset of migraine and will resolve after laying down.     History provided for reference purposes only Update 08/04/2023 JM: Patient returns for 66-month follow-up unaccompanied.  He has since under went C4-7 ACDF and left cubital tunnel release in 05/2023 with Dr. Debby.  Reports improvement of left hand symptoms, increased sensation and strength, still has very slight weakness and numbness but overall greatly improved.  He continues to have RUE symptoms, reports issues with his shoulder blades  and possibly pursuing further surgery for RUE symptoms including ulnar release but still waiting on insurance approval.  He continues to have poor gait/balance.  He also reports continued left facial numbness.  Currently working on exercises at home, plans on considering pursuing therapy after neck surgery if needed.  Reports all medications were stopped prior to his recent surgery.  He has since been restarted on Trulicity  and Jardiance .  He has not yet restarted Plavix  or atorvastatin .  He is also previously on amitriptyline  for migraine prophylaxis and neuropathy pain but this was never restarted.  Feels headaches improved after surgery, currently occurring about once every 1.5 weeks.  Never received Nurtec.  Update 01/26/2023 JM: Patient returns for follow-up visit.  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  Referral placed to orthopedic and neurosurgery.  Reports continued left-sided weakness gradually worsening and now affecting right side. Reports more recently after resting right arm on table at chest height, will start to have pain in shoulder that radiates to ear and jaw. Reports worked with PT with some improvement but after stopping, symptoms returned and has been gradually worsening.  Recently had follow-up with neurosurgery Dr. Debby, OV note reviewed, due to failure of medical and physical therapy and progressive nature of symptoms resulting in functional loss, recommended pursuing surgical intervention in form of C4-5, C5-6, C6-7 ACDF as  well as left cubital tunnel release and likely need to consider right cubital  tunnel release in the future.  Patient questions pursuing surgery at this point, concerned that he was only seen by Dr. Debby twice and had no further testing or imaging prior to recommending surgical intervention.   Reports continued migraine headaches, about 2-3 per month, usually has at least 1 more severe migraine.  Remains on amitriptyline  100 mg nightly for both migraine prophylaxis and neuropathic pain. Use of naratriptan  but does cause fatigue, usually falls asleep and will wake up without migraine. C/o difficult time sleeping due to pain and unable to get comfortable.   Denies any other neurological or new stroke symptoms.  Remains on Plavix  and atorvastatin .  Routinely follows with PCP for stroke risk factor management.  Update 06/02/2022 JM: Patient returns for 33-month follow-up.  He continues to experience left-sided numbness and weakness which has been gradually worsening since prior visit. Does have mild RLE numbness distally but this has been stable. He has difficulty holding or picking up objects and has been having more difficulty with his left leg giving out at times while ambulating, he does questions some weakness in LLE. Denies lower back pain. Has had difficulty sleeping due to pain especially in his neck and left shoulder. Has been previously referred to neurosurgery and orthopedics for cervical radiculopathy with difficulty finding office that will accept his insurance but has been recently approved for Medicaid.  He continues to apply for disability.  Migraines have improved since prior visit, about 3 migraines per month. Current use of amitriptyline  75 mg nightly. Was unable to try naratriptan  due to co-pay.  He is interested in trying that he has Medicaid.  Denies any new stroke or neurological symptoms.  Remains on Plavix  and atorvastatin .  Blood pressure well-controlled.  Has not had recent follow-up with PCP.  Update 11/25/2021 JM: Patient returns for 82-month follow-up  unaccompanied.  Left arm weakness/numbness persist. Continued difficulty with using left hand. Does have left shoulder pain. Reports numbness/pain especially from elbow to outer hand. Continued neck pain. Can have difficulty sleeping at night.  Completed MR cervical spine which showed severe bilateral foraminal stenosis and disc bulging at C5-6 and C6-7, referral placed to neurosurgery but per patient, refused to see patient due to being covered under [facility].   Continued left leg numbness and imbalance. Continued b/l foot numbness.   Migraines still 2-3x per week but severe migraine about 1x per week which can be debilitating, did see much benefit after increasing amitriptyline  dosage at prior visit. He does take nightly but has also been taking as needed for acute management in addition to naproxen. Migraines typically resolve after 30 to 60 minutes. Previously prescribed naratriptan  as intolerance to rizatriptan  and sumatriptan but reports he never received prescription.  Compliant on Plavix  and atorvastatin . Denies any new stroke type symptoms. Blood pressure today 129/93.  Prior follow-up with PCP 6 months ago.  He is in the process of applying for Medicaid, reports needs to apply for disability first. Trying to collect all prior medical information as advised by social security.   MRI cervical spine 09/16/2021 IMPRESSION:  - At C6-7 disc bulging and uncovertebral joint hypertrophy with moderate spinal stenosis and severe bilateral foraminal stenosis. No cord signal changes. - At C5-6 disc bulging and uncovertebral joint hypertrophy with mild spinal stenosis and severe bilateral foraminal stenosis. No cord signal changes. - At C4-5 right ward disc bulging and uncovertebral joint hypertrophy with severe right foraminal  stenosis.   UPDATE 08/26/2021 JM: Returns for 62-month follow-up unaccompanied.  Reports worsening of left hand weakness more so with digit 1 and 5, greater difficulty making a  fist and dropping things, does have long standing hx of neck pain which has also been progressing, can radiate down in to his shoulder and down the back side of his left arm, usually worse in the morning. Also reports continued imbalance and left leg numbness. Continues bilateral foot numbness, bottom of feet, stable. Continued migraines approximately 2-3x per week, currently on amitriptyline  25 mg nightly, will use Maxalt  which can help take the edge off but unable to take during the day due to fatigue, will use naproxen during the day if needed with benefit if he takes soon enough. Does not overuse naproxen. 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 Hx of Bell's palsy, resolved.  Remains on Plavix  and atorvastatin , denies side effects.  Blood pressure today 137/87.  A1c 9.0 (05/2021) with PCP making adjustments to diabetic regimen. No further concerns at this time.   UPDATE 02/18/2021 JM: Matthew Jennings returns for 6 months follow-up.  No residual facial weakness or reoccurring of symptoms.  Continued left hand weakness with numbness (ring and pinky finger and occasional thumb) with difficulty picking up smaller objects and left leg numbness with leg giving out leading to imbalance.  No AD and no falls. Denies any associated pain such as in neck, lower back, hip or knee.  Also reports occasional slurred speech especially when trying to speak quicker.  All symptoms persistent since 05/2020.  Denies new stroke/TIA symptoms.  Reports improvement of bilateral foot numbness as glucose levels improved (most recent A1c 7.4 down from 10.4).  Migraine headaches improved since prior visit - only took 1 month of amitriptyline  as he was not aware he had refills.  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 He has been doing cooking at home (like he would at work) without difficulty.  Remains on atorvastatin  40 mg daily.  Blood pressure today 126/86.  No further concerns at this  time.   Initial consult visit 08/20/2020 Dr. Margaret: 58 year old male here for evaluation of Bell's palsy.  January 2022 patient had onset of left facial weakness, went to the ER for evaluation.  He was also having left hand numbness at the time and had MRI of the brain which showed enhancement of the left facial nerve.  No cause for left hand numbness was found.  Patient followed up with ENT and Bell's palsy symptoms have improved.  Patient continues to have left hand numbness and weakness, mainly affecting digits 1 and 5.  Patient also has bilateral toe and feet numbness.  Patient has new diagnosis of diabetes, to started medications about 1 month ago.  A1c is greater than 11.  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  Patient has history of migraine since age 14 years old with global intense squeezing throbbing sensation, sensitive to light and sound, triggered by weather changes.  Having 3 headaches per week.  He has tried Aleve and Keppra in the past without relief.    REVIEW OF SYSTEMS: Full 14 system review of systems performed and negative with exception of: as per HPI.   ALLERGIES: Allergies  Allergen Reactions   Penicillins Anaphylaxis   Aspirin Other (See Comments)    Lyme Disease     HOME  MEDICATIONS: Outpatient Medications Prior to Visit  Medication Sig Dispense Refill   atorvastatin  (LIPITOR) 40 MG tablet Take 1 tablet (40 mg total) by mouth daily. 90 tablet 0   Continuous Glucose Sensor (FREESTYLE LIBRE 3 PLUS SENSOR) MISC Change sensor every 15 days. 2 each 3   Dulaglutide  (TRULICITY ) 1.5 MG/0.5ML SOAJ Inject 1.5 mg into the skin once a week. 6 mL 1   empagliflozin  (JARDIANCE ) 25 MG TABS tablet Take 1 tablet (25 mg total) by mouth daily before breakfast. 90 tablet 2   ezetimibe  (ZETIA ) 10 MG tablet Take 1 tablet (10 mg total) by mouth daily. 90 tablet 3   losartan  (COZAAR ) 25 MG tablet Take 1  tablet (25 mg total) by mouth daily. 90 tablet 3   Rimegepant Sulfate (NURTEC) 75 MG TBDP Take 1 tablet (75 mg total) by mouth as needed. 16 tablet 11   pregabalin  (LYRICA ) 25 MG capsule Take 1 capsule (25 mg total) by mouth 2 (two) times daily. (Patient not taking: Reported on 02/23/2024) 60 capsule 0   No facility-administered medications prior to visit.    PAST MEDICAL HISTORY: Past Medical History:  Diagnosis Date   Arthritis    Bell's palsy    left   DM (diabetes mellitus) (HCC)    Type 2   Facial paralysis/Bells palsy    Headache    Migraines   Hx of migraines    Stroke (HCC) 2020   Pt states it was a Palsy episode but due to increase in symptoms, classified it as mild stroke leaving pt with current spine/nerve/strength problems    PAST SURGICAL HISTORY: Past Surgical History:  Procedure Laterality Date   ANTERIOR CERVICAL DECOMP/DISCECTOMY FUSION N/A 06/02/2023   Procedure: CERVICAL FOUR-FIVE, CERVICAL FIVE-SIX, CERVICAL SIX-SEVEN ANTERIOR CERVICAL DECOMPRESSION/DISCECTOMY FUSION;  Surgeon: Debby Dorn MATSU, MD;  Location: [facility] OR;  Service: Neurosurgery;  Laterality: N/A;   KNEE RECONSTRUCTION Left    58 years old   ULNAR NERVE TRANSPOSITION Left 06/02/2023   Procedure: ULNAR NERVE DECOMPRESSION/TRANSPOSITION;  Surgeon: Debby Dorn MATSU, MD;  Location: [facility] OR;  Service: Neurosurgery;  Laterality: Left;   VENTRAL HERNIA REPAIR     As a newborn    FAMILY HISTORY: Family History  Problem Relation Age of Onset   Cancer Mother    Alzheimer's disease Father     SOCIAL HISTORY: Social History   Socioeconomic History   Marital status: Single    Spouse name: Not on file   Number of children: 0   Years of education: Not on file   Highest education level: Associate degree: occupational, Scientist, product/process development, or vocational program  Occupational History    Comment: EMT  Tobacco Use   Smoking status: Never    Passive exposure: Yes   Smokeless tobacco: Never  Vaping Use    Vaping status: Never Used  Substance and Sexual Activity   Alcohol use: Yes    Comment: occasionally   Drug use: Not Currently   Sexual activity: Not Currently  Other Topics Concern   Not on file  Social History Narrative   Lives alone   Social Drivers of Health   Financial Resource Strain: High Risk (03/23/2023)   Overall Financial Resource Strain (CARDIA)    Difficulty of Paying Living Expenses: Very hard  Food Insecurity: Food Insecurity Present (03/23/2023)   Hunger Vital Sign    Worried About Running Out of Food in the Last Year: Often true    Ran Out of Food in the Last Year: Often true  Transportation Needs: No Transportation Needs (03/23/2023)   PRAPARE - Administrator, Civil Service (Medical): No    Lack of Transportation (Non-Medical): No  Physical Activity: Sufficiently Active (03/23/2023)   Exercise Vital Sign    Days of Exercise per Week: 5 days    Minutes of Exercise per Session: 30 min  Stress: Stress Concern Present (03/23/2023)   [employer] of Occupational Health - Occupational Stress Questionnaire    Feeling of Stress : Very much  Social Connections: Socially Isolated (03/23/2023)   Social Connection and Isolation Panel    Frequency of Communication with Friends and Family: More than three times a week    Frequency of Social Gatherings with Friends and Family: Three times a week    Attends Religious Services: Never    Active Member of Clubs or Organizations: No    Attends Banker Meetings: Never    Marital Status: Never married  Intimate Partner Violence: Not At Risk (03/23/2023)   Humiliation, Afraid, Rape, and Kick questionnaire    Fear of Current or Ex-Partner: No    Emotionally Abused: No    Physically Abused: No    Sexually Abused: No     PHYSICAL EXAM  GENERAL EXAM/CONSTITUTIONAL: Vitals:  Vitals:   02/23/24 1257  BP: 127/85  Pulse: 71  Weight: 172 lb (78 kg)  Height: 5' 10 (1.778 m)   Patient is in no  distress; very pleasant middle-age Caucasian male, well developed, nourished and groomed; neck is supple  MUSCULOSKELETAL: Gait, strength, tone, movements noted in Neurologic exam below  NEUROLOGIC: MENTAL STATUS:  awake, alert, oriented to person, place and time recent and remote memory intact normal attention and concentration language fluent, comprehension intact, naming intact fund of knowledge appropriate  CRANIAL NERVE:  2nd - no papilledema on fundoscopic exam 2nd, 3rd, 4th, 6th - pupils equal and reactive to light, visual fields full to confrontation, extraocular muscles intact, no nystagmus 5th - facial sensation symmetric 7th - facial strength symmetric 8th - hearing intact 9th - palate elevates symmetrically, uvula midline 11th - shoulder shrug symmetric 12th - tongue protrusion midline  MOTOR:  LUE 5/5 PROXIMAL, 4+/5 DISTAL AND LLE 4+-5-/5 HF AND ADF, MILD ATROPHY AND WEAKNESS LEFT HAND INTRINSIC MUSCLES (although greatly improved since prior visit). RUE 4+5, DIFFICULTY MAKING COMPLETE FIST. RLE 5/5  SENSORY:  normal symmetrically to vibration; DECREASED LIGHT TOUCH AND PP SENSATION RUE  COORDINATION:  finger-nose-finger mild incoordination of LUE, decreased RIGHT>LEFT fine finger movements  REFLEXES:  deep tendon reflexes TRACE and symmetric  GAIT/STATION:  narrow based gait with mild imbalance with decreased LLE stride length and step height without use of assistive device. Able to stand from seated position with arms crossed.       DIAGNOSTIC DATA (LABS, IMAGING, TESTING) - I reviewed patient records, labs, notes, testing and imaging myself where available.  Lab Results  Component Value Date   WBC 8.9 05/26/2023   HGB 14.7 05/26/2023   HCT 44.0 05/26/2023   MCV 84.0 05/26/2023   PLT 201 05/26/2023      Component Value Date/Time   NA 140 05/26/2023 1110   NA 143 02/02/2023 0951   K 3.8 05/26/2023 1110   CL 106 05/26/2023 1110   CO2 25 05/26/2023  1110   GLUCOSE 167 (H) 05/26/2023 1110   BUN 13 05/26/2023 1110   BUN 21 02/02/2023 0951   CREATININE 1.12 05/26/2023 1110   CALCIUM  9.6 05/26/2023 1110   PROT 8.3 (H) 05/17/2020  1416   ALBUMIN 4.6 05/17/2020 1416   AST 23 05/17/2020 1416   ALT 33 05/17/2020 1416   ALKPHOS 99 05/17/2020 1416   BILITOT 0.6 05/17/2020 1416   GFRNONAA >60 05/26/2023 1110   Lab Results  Component Value Date   CHOL 94 (L) 11/17/2023   HDL 41 11/17/2023   LDLCALC 40 11/17/2023   TRIG 55 11/17/2023   CHOLHDL 2.3 11/17/2023   Lab Results  Component Value Date   HGBA1C 7.6 (A) 01/28/2024   Lab Results  Component Value Date   VITAMINB12 529 06/02/2022   Lab Results  Component Value Date   TSH 3.010 06/02/2022    05/17/20 MRI brain / IAC  - Prominent asymmetric enhancement of the left seventh cranial nerve extending from the IAC fundus through the mastoid segment. Findings are compatible with Bell's palsy in the appropriate clinical setting. Should the patient's symptoms persist or follow an unexpected course, follow-up brain MRI with contrast and with IAC protocol recommended. - T2 hyperintensity and enhancement within the left mandibular condyle, nonspecific but possibly degenerative or inflammatory in etiology. Clinical correlation is recommended.  MRI CERVICAL SPINE WO CONTRAST 09/16/2021 IMPRESSION:  MRI cervical spine (without) demonstrating: - At C6-7 disc bulging and uncovertebral joint hypertrophy with moderate spinal stenosis and severe bilateral foraminal stenosis. No cord signal changes. - At C5-6 disc bulging and uncovertebral joint hypertrophy with mild spinal stenosis and severe bilateral foraminal stenosis. No cord signal changes. - At C4-5 right ward disc bulging and uncovertebral joint hypertrophy with severe right foraminal stenosis.        ASSESSMENT AND PLAN  58 y.o. year old male here with:  Dx:  1. Migraine with aura and without status migrainosus, not  intractable   2. H/O cervical spine surgery   3. Cervical stenosis of spinal canal   4. Ulnar neuropathy of both upper extremities   5. Abnormality of gait due to impairment of balance   6. History of Bell's palsy         PLAN:   MIGRAINE WITH AURA -Improved after ACDF, typically about 1-2/month -will continue amitriptyline  as needed, declines need a refill at this time. Advised to call if migraines should worsen. He is aware amitriptyline  is typically not prescribed to use as needed but as this regimen has been working, he wishes to continue at this time -previously tried/failed rizatriptan , sumatriptan and naratriptan    LEFT AND RIGHT SIDED WEAKNESS AND PARESTHESIAS BILATERAL HAND NUMBNESS (likely d/t severe cervical stenosis, peripheral neuropathy; carpal tunnel syndrome + ulnar neuropathy) GAIT IMPAIRMENT/IMBALANCE  NEUROPATHY, DIABETIC  -s/p ACDF C4-7, left cubital tunnel release 05/2023 and right cubital tunnel release 11/2023 by Dr. Debby with significant improvement of BUE symptoms, still decreased right hand movement and weakness -has f/u with neurosurgery next week with plans on discussing pursuing PT and OT.  Advised to call if assistance is needed with referrals -Ongoing follow-up with PCP for diabetic management -EMG/NCV 07/2022 severe ulnar neuropathy at left and moderate on the right, severe distal lower extremity axonal polyneuropathy, mild upper extremity polyneuropathy, chronic neurogenic changes in left muscles that share C6 innervation consistent with MRI finding -MR cervical 09/16/2021 severe bilateral foraminal stenosis and disc bulging at C5-6 and C6-7 and severe right foraminal stenosis C4-5  -Previously questioned right sided stroke not seen on imaging contributing to symptoms but based on prior MRI C-spine and EMG/NCV, left-sided weakness and paresthesias more likely in setting of severe cervical spinal stenosis, carpal tunnel syndrome and ulnar neuropathy.  LEFT BELL'S PALSY (resolved) - continue supportive care     Follow-up in 1 year or call earlier if needed       CC:  Lorren Greig PARAS, NP    I personally spent a total of 43 minutes in the care of the patient today including preparing to see the patient, getting/reviewing separately obtained history, performing a medically appropriate exam/evaluation, counseling and educating, placing orders, and documenting clinical information in the EHR.   Harlene Bogaert, AGNP-BC  [facility] [address] [place], [place] [address]  Phone [phone] Fax [phone] Note: This document was prepared with digital dictation and possible smart phrase technology. Any transcriptional errors that result from this process are unintentional.

## 2024-02-23 NOTE — Patient Instructions (Addendum)
 Your Plan:  Continue amitriptyline  as needed for migraine rescue   Please call if migraines should worsen   Follow up with Dr. Debby regarding doing physical and/or occupational therapy for continued right hand weakness and decreased mobility, left arm symptoms and balance difficulties       Follow up in 1 year or call earlier if needed        Thank you for coming to see us  at [facility]. I hope we have been able to provide you high quality care today.  You may receive a patient satisfaction survey over the next few weeks. We would appreciate your feedback and comments so that we may continue to improve ourselves and the health of our patients.

## 2024-02-28 ENCOUNTER — Ambulatory Visit: Payer: Self-pay | Admitting: Pulmonary Disease

## 2024-03-22 ENCOUNTER — Other Ambulatory Visit: Payer: Self-pay | Admitting: Family Medicine

## 2024-03-22 ENCOUNTER — Telehealth: Payer: Self-pay | Admitting: Emergency Medicine

## 2024-03-22 NOTE — Telephone Encounter (Signed)
 Copied from CRM [id number]. Topic: Clinical - Medication Question >> Mar 22, 2024  4:13 PM Myrick T wrote: Reason for CRM: patient called to f/u on the refill request for Dulaglutide  (TRULICITY ) 1.5 MG/0.5ML SOAJ. Advised patient that the request was recvd but can take up to 3 business days. Patient is requesting that provider give his refills so he does not have to call every month or give him a 90 days supply. Please f/u with patient

## 2024-03-23 NOTE — Telephone Encounter (Signed)
 Schedule appointment. Last office visit 07/23/2023. Dulaglutide  prescribed on (03/22/2024  9:29 PM EST) by Delbert Clam, MD.

## 2024-03-23 NOTE — Telephone Encounter (Signed)
 Pt scheduled

## 2024-03-28 ENCOUNTER — Telehealth: Payer: Self-pay | Admitting: Family

## 2024-03-28 NOTE — Telephone Encounter (Signed)
 LVM to confirm appt  11/18

## 2024-03-29 ENCOUNTER — Ambulatory Visit: Attending: Family | Admitting: Pharmacist

## 2024-03-29 ENCOUNTER — Encounter: Payer: Self-pay | Admitting: Pharmacist

## 2024-03-29 ENCOUNTER — Other Ambulatory Visit: Payer: Self-pay

## 2024-03-29 DIAGNOSIS — Z7985 Long-term (current) use of injectable non-insulin antidiabetic drugs: Secondary | ICD-10-CM | POA: Diagnosis not present

## 2024-03-29 DIAGNOSIS — G510 Bell's palsy: Secondary | ICD-10-CM | POA: Diagnosis not present

## 2024-03-29 DIAGNOSIS — I10 Essential (primary) hypertension: Secondary | ICD-10-CM | POA: Diagnosis not present

## 2024-03-29 DIAGNOSIS — Z7984 Long term (current) use of oral hypoglycemic drugs: Secondary | ICD-10-CM

## 2024-03-29 DIAGNOSIS — E785 Hyperlipidemia, unspecified: Secondary | ICD-10-CM | POA: Insufficient documentation

## 2024-03-29 DIAGNOSIS — Z8673 Personal history of transient ischemic attack (TIA), and cerebral infarction without residual deficits: Secondary | ICD-10-CM | POA: Diagnosis not present

## 2024-03-29 DIAGNOSIS — E119 Type 2 diabetes mellitus without complications: Secondary | ICD-10-CM | POA: Insufficient documentation

## 2024-03-29 DIAGNOSIS — R9431 Abnormal electrocardiogram [ECG] [EKG]: Secondary | ICD-10-CM

## 2024-03-29 DIAGNOSIS — E7800 Pure hypercholesterolemia, unspecified: Secondary | ICD-10-CM

## 2024-03-29 MED ORDER — EZETIMIBE 10 MG PO TABS: 10.0000 mg | ORAL_TABLET | Freq: Every day | ORAL | 3 refills | Status: DC

## 2024-03-29 MED ORDER — ATORVASTATIN CALCIUM 40 MG PO TABS: 40.0000 mg | ORAL_TABLET | Freq: Every day | ORAL | 0 refills | Status: DC

## 2024-03-29 MED ORDER — EMPAGLIFLOZIN 25 MG PO TABS: 25.0000 mg | ORAL_TABLET | Freq: Every day | ORAL | 2 refills | Status: DC

## 2024-03-29 MED ORDER — FREESTYLE LIBRE 3 PLUS SENSOR MISC
3 refills | Status: DC
  Filled 2024-03-29 (×2): qty 2, 30d supply, fill #0

## 2024-03-29 MED ORDER — LOSARTAN POTASSIUM 25 MG PO TABS: 25.0000 mg | ORAL_TABLET | Freq: Every day | ORAL | 3 refills | Status: DC

## 2024-03-29 MED ORDER — TRULICITY 1.5 MG/0.5ML ~~LOC~~ SOAJ: 1.5000 mg | SUBCUTANEOUS | 1 refills | Status: DC

## 2024-03-29 MED ORDER — FREESTYLE LIBRE 3 PLUS SENSOR MISC: 3 refills | Status: AC

## 2024-03-29 NOTE — Progress Notes (Signed)
 S:     No chief complaint on file.  58 y.o. male who presents for diabetes evaluation, education, and management. Of note, he was enrolled in LIBERATE from 07/20/23 - to 01/28/2024. A1c at time of enrollment was 8.0%. This improved to 7.9% in June of this year, and, ultimately, to 7.6% in September.   Today, patient arrives in good spirits and presents without any assistance. Patient was originally referred by Primary Care Provider on 06/22/23. Has an appt upcoming with her on 05/03/2024.   PMH includes T2D, Bell's palsy, HTN, HLD, stroke (2020)   Today he is doing okay. Patient's diabetes is longstanding. He reports that he continues to take the Trulicity  1.5 mg dose but has been without for ~2 weeks due to an insurance issue. Denies any issues with nausea, vomiting on the current dose. Does have some dyspepsia but is manageable. He endorses adherence to the Jardiance  as well. Of note, I did not make any changes in September given his AGP report. His GMI at the time was 6.8%, and his TIR was 78%.  Family/Social History:  FH: no pertinent positives Tobacco: never smoker Alcohol: occasional use   Current diabetes medications include: Trulicity  1.5 mg weekly, Jardiance  25 mg daily -Has been without Trulicity  for 2 weeks  Insurance coverage: Medicaid + Medicare  Patient denies hypoglycemic events.   Patient denies nocturia.  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 Stable. Re-establishing with podiatry. Pregabalin  does provide relief. Patient denies any vision changes since last visit.  . Patient reports self foot exams.     Patient reported dietary habits: Reports that he recently reduced intake of sugary fruits.   Exercise habits: yard work, house work   O:  Ambulance Person today for a 28 day report % Time CGM is active: 94% Average Glucose: 189 mg/dL Glucose Management Indicator: 7.8  Glucose  Variability: 28.1% (goal <36%) Time in Goal:  - Time in range 70-180: 50% - Time above range: 50% - Time below range: 0% Observed patterns: -Lows occur: 12a-3a and then again 6p-9p -Spikes occur infrequently but most often occur between 8a-12p if they do occur  -Of note, I dug a little deeper into glycemic control recently. Over the last 2 weeks, specifically, GMI is up to 8.4% with a TIR of 34%. His glucose avg is 211 mg/dL.  Lab Results  Component Value Date   HGBA1C 7.6 (A) 01/28/2024   There were no vitals filed for this visit.  Lipid Panel     Component Value Date/Time   CHOL 94 (L) 11/17/2023 1117   TRIG 55 11/17/2023 1117   HDL 41 11/17/2023 1117   CHOLHDL 2.3 11/17/2023 1117   LDLCALC 40 11/17/2023 1117   Clinical Atherosclerotic Cardiovascular Disease (ASCVD): Yes  The ASCVD Risk score (Arnett DK, et al., 2019) failed to calculate for the following reasons:   Risk score cannot be calculated because patient has a medical history suggesting prior/existing ASCVD   Patient is participating in a Managed Medicaid Plan: No    A/P: Diabetes longstanding currently above goal based on A1c. GMI shows worsening control at home, particularly over the last 2 weeks due to going without Trulicity . Patient is able to verbalize appropriate hypoglycemia management plan. Refills sent to his pharmacy. Will not change any medication at this time.  -Continued current regimen. -Patient educated on purpose, proper use, and potential adverse effects of Trulicity , Jardiance .  -Extensively discussed pathophysiology  of diabetes, recommended lifestyle interventions, dietary effects on blood sugar control.  -Counseled on s/sx of and management of hypoglycemia.  -Next A1c anticipated 04/2024.   Written patient instructions provided. Patient verbalized understanding of treatment plan.  Total time in face to face counseling 30 minutes.    Follow-up:  Pharmacist in 6-8 weeks.  Herlene Fleeta Morris,  PharmD, JAQUELINE, CPP Clinical Pharmacist [facility] [phone]

## 2024-04-20 ENCOUNTER — Encounter: Payer: Self-pay | Admitting: Family

## 2024-05-03 ENCOUNTER — Ambulatory Visit: Admitting: Family

## 2024-05-24 ENCOUNTER — Encounter: Payer: Self-pay | Admitting: Family

## 2024-05-24 ENCOUNTER — Other Ambulatory Visit: Payer: Self-pay

## 2024-05-24 ENCOUNTER — Ambulatory Visit (INDEPENDENT_AMBULATORY_CARE_PROVIDER_SITE_OTHER): Admitting: Family

## 2024-05-24 VITALS — BP 127/83 | HR 78 | Ht 70.0 in | Wt 172.0 lb

## 2024-05-24 DIAGNOSIS — Z794 Long term (current) use of insulin: Secondary | ICD-10-CM

## 2024-05-24 DIAGNOSIS — E119 Type 2 diabetes mellitus without complications: Secondary | ICD-10-CM

## 2024-05-24 DIAGNOSIS — Z0189 Encounter for other specified special examinations: Secondary | ICD-10-CM | POA: Diagnosis not present

## 2024-05-24 DIAGNOSIS — E1165 Type 2 diabetes mellitus with hyperglycemia: Secondary | ICD-10-CM

## 2024-05-24 DIAGNOSIS — Z0100 Encounter for examination of eyes and vision without abnormal findings: Secondary | ICD-10-CM | POA: Diagnosis not present

## 2024-05-24 DIAGNOSIS — E785 Hyperlipidemia, unspecified: Secondary | ICD-10-CM

## 2024-05-24 DIAGNOSIS — I10 Essential (primary) hypertension: Secondary | ICD-10-CM | POA: Diagnosis not present

## 2024-05-24 MED ORDER — EZETIMIBE 10 MG PO TABS
10.0000 mg | ORAL_TABLET | Freq: Every day | ORAL | 0 refills | Status: AC
  Filled 2024-05-24: qty 90, 90d supply, fill #0

## 2024-05-24 MED ORDER — TRULICITY 1.5 MG/0.5ML ~~LOC~~ SOAJ
1.5000 mg | SUBCUTANEOUS | 0 refills | Status: DC
  Filled 2024-05-24: qty 6, 84d supply, fill #0

## 2024-05-24 MED ORDER — LOSARTAN POTASSIUM 25 MG PO TABS
25.0000 mg | ORAL_TABLET | Freq: Every day | ORAL | 0 refills | Status: AC
  Filled 2024-05-24: qty 90, 90d supply, fill #0

## 2024-05-24 MED ORDER — EMPAGLIFLOZIN 25 MG PO TABS
25.0000 mg | ORAL_TABLET | Freq: Every day | ORAL | 0 refills | Status: AC
  Filled 2024-05-24: qty 90, 90d supply, fill #0

## 2024-05-24 MED ORDER — ATORVASTATIN CALCIUM 40 MG PO TABS
40.0000 mg | ORAL_TABLET | Freq: Every day | ORAL | 0 refills | Status: AC
  Filled 2024-05-24: qty 90, 90d supply, fill #0

## 2024-05-24 NOTE — Progress Notes (Signed)
 "   Patient ID: Matthew Jennings, male    DOB: January 07, 1966  MRN: [id number]  CC: Chronic Conditions Follow-Up  Subjective: Matthew Jennings is a 59 y.o. male who presents for chronic conditions follow-up.   His concerns today include:  - Doing well on Losartan , no issues/concerns. He does not complain of red flag symptoms such as but not limited to chest pain, shortness of breath, worst headache of life, nausea/vomiting.  - Patient seen on 03/29/2024 at [facility] for type 2 diabetes mellitus without complication, without long-term current use of insulin . He is doing well on Dulagutide and Empagliflozin , no issues/concerns. He denies red flag symptoms associated with diabetes.  - Due for diabetic eye exam.  - Due for diabetic foot exam. - Doing well on Atorvastatin  and Ezetimibe , no issues/concerns.   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) 11/30/2020   Numbness and tingling in left hand 11/22/2020   Hearing loss of right ear due to cerumen impaction 08/10/2020   Hearing loss of left ear 08/10/2020   Balance problem 08/10/2020   Type 2 diabetes mellitus (HCC) 07/10/2020   Hyperlipidemia 07/10/2020   Bell's palsy 07/07/2020   Elevated blood-pressure reading without diagnosis of hypertension 07/07/2020     Medications Ordered Prior to Encounter[1]  Allergies[2]  Social History   Socioeconomic History   Marital status: Single    Spouse name: Not on file   Number of children: 0   Years of education: Not on file   Highest education level: Associate degree: occupational, scientist, product/process development, or vocational program  Occupational History    Comment: EMT  Tobacco Use   Smoking status: Never    Passive exposure: Yes   Smokeless tobacco: Never  Vaping Use   Vaping  status: Never Used  Substance and Sexual Activity   Alcohol use: Yes    Comment: occasionally   Drug use: Not Currently   Sexual activity: Not Currently  Other Topics Concern   Not on file  Social History Narrative   Lives alone   Social Drivers of Health   Tobacco Use: Medium Risk (05/24/2024)   Patient History    Smoking Tobacco Use: Never    Smokeless Tobacco Use: Never    Passive Exposure: Yes  Financial Resource Strain: High Risk (03/23/2023)   Overall Financial Resource Strain (CARDIA)    Difficulty of Paying Living Expenses: Very hard  Food Insecurity: Food Insecurity Present (03/23/2023)   Hunger Vital Sign    Worried About Radiation Protection Practitioner of Food in the Last Year: Often true    Ran Out of Food in the Last Year: Often true  Transportation Needs: No Transportation Needs (03/23/2023)   PRAPARE - Administrator, Civil Service (Medical): No    Lack of Transportation (Non-Medical): No  Physical Activity: Sufficiently Active (03/23/2023)   Exercise Vital Sign    Days of Exercise per Week: 5 days    Minutes of Exercise per Session: 30 min  Stress: Stress Concern Present (03/23/2023)   [employer] of Occupational Health - Occupational Stress Questionnaire    Feeling of Stress : Very much  Social Connections: Socially Isolated (03/23/2023)   Social Connection and Isolation Panel    Frequency of Communication with Friends and Family: More than three  times a week    Frequency of Social Gatherings with Friends and Family: Three times a week    Attends Religious Services: Never    Active Member of Clubs or Organizations: No    Attends Banker Meetings: Never    Marital Status: Never married  Intimate Partner Violence: Not At Risk (03/23/2023)   Humiliation, Afraid, Rape, and Kick questionnaire    Fear of Current or Ex-Partner: No    Emotionally Abused: No    Physically Abused: No    Sexually Abused: No  Depression (PHQ2-9): Low Risk (04/27/2023)    Depression (PHQ2-9)    PHQ-2 Score: 1  Alcohol Screen: Low Risk (03/23/2023)   Alcohol Screen    Last Alcohol Screening Score (AUDIT): 0  Housing: Medium Risk (03/23/2023)   Housing    Last Housing Risk Score: 1  Utilities: At Risk (03/23/2023)   AHC Utilities    Threatened with loss of utilities: Yes  Health Literacy: Adequate Health Literacy (03/23/2023)   B1300 Health Literacy    Frequency of need for help with medical instructions: Never    Family History  Problem Relation Age of Onset   Cancer Mother    Alzheimer's disease Father     Past Surgical History:  Procedure Laterality Date   ANTERIOR CERVICAL DECOMP/DISCECTOMY FUSION N/A 06/02/2023   Procedure: CERVICAL FOUR-FIVE, CERVICAL FIVE-SIX, CERVICAL SIX-SEVEN ANTERIOR CERVICAL DECOMPRESSION/DISCECTOMY FUSION;  Surgeon: Debby Dorn MATSU, MD;  Location: [facility] OR;  Service: Neurosurgery;  Laterality: N/A;   KNEE RECONSTRUCTION Left    59 years old   ULNAR NERVE TRANSPOSITION Left 06/02/2023   Procedure: ULNAR NERVE DECOMPRESSION/TRANSPOSITION;  Surgeon: Debby Dorn MATSU, MD;  Location: [facility] OR;  Service: Neurosurgery;  Laterality: Left;   VENTRAL HERNIA REPAIR     As a newborn    ROS: Review of Systems Negative except as stated above  PHYSICAL EXAM: BP 127/83   Pulse 78   Ht 5' 10 (1.778 m)   Wt 172 lb (78 kg)   SpO2 97%   BMI 24.68 kg/m   Physical Exam HENT:     Head: Normocephalic and atraumatic.     Nose: Nose normal.     Mouth/Throat:     Mouth: Mucous membranes are moist.     Pharynx: Oropharynx is clear.  Eyes:     Extraocular Movements: Extraocular movements intact.     Conjunctiva/sclera: Conjunctivae normal.     Pupils: Pupils are equal, round, and reactive to light.  Cardiovascular:     Rate and Rhythm: Normal rate and regular rhythm.     Pulses: Normal pulses.     Heart sounds: Normal heart sounds.  Pulmonary:     Effort: Pulmonary effort is normal.     Breath sounds: Normal breath  sounds.  Musculoskeletal:        General: Normal range of motion.     Cervical back: Normal range of motion and neck supple.  Neurological:     General: No focal deficit present.     Mental Status: He is alert and oriented to person, place, and time.  Psychiatric:        Mood and Affect: Mood normal.        Behavior: Behavior normal.    ASSESSMENT AND PLAN: 1. Primary hypertension (Primary) - Continue Losartan  as prescribed.  - Counseled on blood pressure goal of less than 130/80, low-sodium, DASH diet, medication compliance, and 150 minutes of moderate intensity exercise per week as tolerated. Counseled on medication  adherence and adverse effects. - Follow-up with primary provider in 3 months or sooner if needed.  - losartan  (COZAAR ) 25 MG tablet; Take 1 tablet (25 mg total) by mouth daily.  Dispense: 90 tablet; Refill: 0  2. Type 2 diabetes mellitus with hyperglycemia, with long-term current use of insulin  (HCC) - Hemoglobin A1c result pending.  - Continue Dulaglutide  and Empagliflozin  as prescribed.  - Routine screening.  - Discussed the importance of healthy eating habits, low-carbohydrate diet, low-sugar diet, regular aerobic exercise (at least 150 minutes a week as tolerated) and medication compliance to achieve or maintain control of diabetes. Counseled on medication adherence/adverse effects.  - Follow-up with clinical pharmacist in 4 weeks or sooner if needed.  - Follow-up with primary provider as scheduled.  - Hemoglobin A1c - Basic Metabolic Panel - Dulaglutide  (TRULICITY ) 1.5 MG/0.5ML SOAJ; Inject 1.5 mg into the skin once a week.  Dispense: 6 mL; Refill: 0 - empagliflozin  (JARDIANCE ) 25 MG TABS tablet; Take 1 tablet (25 mg total) by mouth daily before breakfast.  Dispense: 90 tablet; Refill: 0  3. Diabetic eye exam [facility]) - Referral to Ophthalmology for evaluation/management.  - Ambulatory referral to Ophthalmology  4. Encounter for diabetic foot exam [facility]) - Referral  to Podiatry for evaluation/management. - Ambulatory referral to Podiatry  5. Hyperlipidemia, unspecified hyperlipidemia type - Continue Atorvastatin  and Ezetimibe  as prescribed. Counseled on medication adherence/adverse effects.  - Follow-up with primary provider in 3 months or sooner if needed.  - ezetimibe  (ZETIA ) 10 MG tablet; Take 1 tablet (10 mg total) by mouth daily.  Dispense: 90 tablet; Refill: 0 - atorvastatin  (LIPITOR) 40 MG tablet; Take 1 tablet (40 mg total) by mouth daily.  Dispense: 90 tablet; Refill: 0   Patient was given the opportunity to ask questions.  Patient verbalized understanding of the plan and was able to repeat key elements of the plan. Patient was given clear instructions to go to Emergency Department or return to medical center if symptoms don't improve, worsen, or new problems develop.The patient verbalized understanding.   Orders Placed This Encounter  Procedures   Hemoglobin A1c   Basic Metabolic Panel   Ambulatory referral to Podiatry   Ambulatory referral to Ophthalmology     Requested Prescriptions   Signed Prescriptions Disp Refills   Dulaglutide  (TRULICITY ) 1.5 MG/0.5ML SOAJ 6 mL 0    Sig: Inject 1.5 mg into the skin once a week.   empagliflozin  (JARDIANCE ) 25 MG TABS tablet 90 tablet 0    Sig: Take 1 tablet (25 mg total) by mouth daily before breakfast.   ezetimibe  (ZETIA ) 10 MG tablet 90 tablet 0    Sig: Take 1 tablet (10 mg total) by mouth daily.   losartan  (COZAAR ) 25 MG tablet 90 tablet 0    Sig: Take 1 tablet (25 mg total) by mouth daily.   atorvastatin  (LIPITOR) 40 MG tablet 90 tablet 0    Sig: Take 1 tablet (40 mg total) by mouth daily.    Return in about 4 weeks (around 06/21/2024) for Follow-Up or next available chronic conditions with Herlene Fleeta Morris, RPH-CPP at [facility].  Greig JINNY Chute, NP      [1]  Current Outpatient Medications on File Prior to Visit  Medication Sig Dispense Refill   Continuous Glucose Sensor (FREESTYLE LIBRE  3 PLUS SENSOR) MISC Change sensor every 15 days. 2 each 3   pregabalin  (LYRICA ) 25 MG capsule Take 1 capsule (25 mg total) by mouth 2 (two) times daily. (Patient not taking: Reported  on 05/24/2024) 60 capsule 0   Rimegepant Sulfate (NURTEC) 75 MG TBDP Take 1 tablet (75 mg total) by mouth as needed. 16 tablet 11   No current facility-administered medications on file prior to visit.  [2]  Allergies Allergen Reactions   Penicillins Anaphylaxis   Aspirin Other (See Comments)    Lyme Disease    "

## 2024-05-25 ENCOUNTER — Ambulatory Visit: Payer: Self-pay | Admitting: Family

## 2024-05-25 DIAGNOSIS — E1165 Type 2 diabetes mellitus with hyperglycemia: Secondary | ICD-10-CM

## 2024-05-25 LAB — BASIC METABOLIC PANEL WITH GFR
BUN/Creatinine Ratio: 19 (ref 9–20)
BUN: 16 mg/dL (ref 6–24)
CO2: 23 mmol/L (ref 20–29)
Calcium: 9.7 mg/dL (ref 8.7–10.2)
Chloride: 106 mmol/L (ref 96–106)
Creatinine, Ser: 0.86 mg/dL (ref 0.76–1.27)
Glucose: 136 mg/dL — ABNORMAL HIGH (ref 70–99)
Potassium: 4.3 mmol/L (ref 3.5–5.2)
Sodium: 144 mmol/L (ref 134–144)
eGFR: 100 mL/min/1.73

## 2024-05-25 LAB — HEMOGLOBIN A1C
Est. average glucose Bld gHb Est-mCnc: 169 mg/dL
Hgb A1c MFr Bld: 7.5 % — ABNORMAL HIGH (ref 4.8–5.6)

## 2024-05-25 MED ORDER — DULAGLUTIDE 3 MG/0.5ML ~~LOC~~ SOAJ: 3.0000 mg | SUBCUTANEOUS | 0 refills | Status: DC

## 2024-05-25 NOTE — Progress Notes (Signed)
 "   S:    Chief Complaint  Patient presents with   Diabetes   59 y.o. male who presents for diabetes evaluation, education, and management. Of note, he was enrolled in LIBERATE from 07/20/23 - to 01/28/2024. A1c at time of enrollment was 8.0%. This improved to 7.9% in June of this year, and, ultimately, to 7.6% in September. PMH includes T2D, Bell's palsy, HTN, HLD, stroke (2020)  Today, patient arrives in good spirits and presents without any assistance. Patient was originally referred by Primary Care Provider on 06/22/23. Last visit with Greig Chute, NP on 05/24/24, no medication changes were made and A1c was 7.5%, slightly decreased from 7.6% on 01/28/24.    Last seen by pharmacy 03/29/24, where the patient reported being off of Trulicity  for ~2 weeks due to issues with insurance. He was instructed to continue his medication regimen, and refills were sent to the pharmacy.   At today's visit, he reports being off of both Trulicity  and Jardiance  for 1 week due to insurance issues and his prescriptions requiring prior authorization. During the visit, I verified with the pharmacy that neither of these prescriptions requires a PA today, so he is able to pick up all of his medications today. He plans on transitioning to mail order and enrolling in autorefills to help him with medication management. He has not been using a CGM since he finished the LIBRE trial but reports stable morning blood sugars between 110 and 140 mg/dL. Discussed his stable A1c level of ~7.5% for the past 6 months and the probability that he would need to have pharmacotherapy changes in order to reduce it to his goal of <7%. He previously tried Trulicity  3 mg weekly but had indigestion for the first 24 hours after his injection, as well as flares in acid reflux when he was being active. He is amenable to re-trying the 3 mg dose today. Of note, he has worked very hard on lifestyle changes for the past 6 months, which has helped stabilize  his blood sugar readings.   Family/Social History:  FH: no pertinent positives Tobacco: never smoker Alcohol: occasional use   Current diabetes medications include:  Trulicity  1.5 mg weekly (Monday)  Jardiance  25 mg daily Previously attempted diabetes medications:  Metformin  IR (GI upset, severe migraines) Updated allergy list to reflect intolerance Ozempic  (too much weight loss) Insulin  (resistant to initiation in the past)  Insurance coverage: Medicaid + Medicare  Patient denies hypoglycemic events.   Patient denies nocturia.  Patient endorses neuropathy (nerve pain)  Numbness in lower extremity, in the midst of neurosurgery procedures. Stable. Re-establishing with podiatry. Pregabalin  does provide relief. Patient denies any vision changes since last visit.   Patient reports self foot exams.     Patient reported dietary habits: Reports that he recently reduced intake of sugary fruits.   Exercise habits: yard work, house work   O:  Fasting glucose measurements: 110-140 mg/dL  Lab Results  Component Value Date   HGBA1C 7.5 (H) 05/24/2024   There were no vitals filed for this visit.  Lipid Panel     Component Value Date/Time   CHOL 94 (L) 11/17/2023 1117   TRIG 55 11/17/2023 1117   HDL 41 11/17/2023 1117   CHOLHDL 2.3 11/17/2023 1117   LDLCALC 40 11/17/2023 1117   Clinical Atherosclerotic Cardiovascular Disease (ASCVD): Yes - stroke (2020)  Patient is participating in a Managed Medicaid Plan: No    A/P: Diabetes is longstanding and currently above the goal of <7%  based on A1c. Discussed multiple medication options, including increasing Trulicity , ER metformin , and sulfonylureas. He is amenable to increasing Trulicity  to 3 mg weekly since A1c is still not at the goal. Overall, commended his continued lifestyle changes and encouraged him to continue.  Increase Trulicity  to 3 mg weekly($4.90/90DS) Continue Jardiance  25 mg daily ($4.90/90DS) Patient educated on  purpose, proper use, and potential adverse effects of Trulicity  and Jardiance .  Extensively discussed pathophysiology of diabetes, recommended lifestyle interventions, dietary effects on blood sugar control.  Counseled on s/sx of and management of hypoglycemia.  Next A1c anticipated 07/2024.   Written patient instructions provided. Patient verbalized understanding of treatment plan.   Total time in face to face counseling 30 minutes.    Follow-up:  PharmD: 08/08/24  Woodie Jock, PharmD PGY1 Pharmacy Resident  05/30/2024   Herlene Fleeta Morris, PharmD, BCACP, CPP Clinical Pharmacist [facility] [phone]   "

## 2024-05-26 ENCOUNTER — Other Ambulatory Visit: Payer: Self-pay

## 2024-05-30 ENCOUNTER — Other Ambulatory Visit: Payer: Self-pay

## 2024-05-30 ENCOUNTER — Ambulatory Visit: Attending: Family | Admitting: Pharmacist

## 2024-05-30 ENCOUNTER — Encounter: Payer: Self-pay | Admitting: Pharmacist

## 2024-05-30 DIAGNOSIS — Z7985 Long-term (current) use of injectable non-insulin antidiabetic drugs: Secondary | ICD-10-CM | POA: Diagnosis not present

## 2024-05-30 DIAGNOSIS — Z7984 Long term (current) use of oral hypoglycemic drugs: Secondary | ICD-10-CM | POA: Insufficient documentation

## 2024-05-30 DIAGNOSIS — E119 Type 2 diabetes mellitus without complications: Secondary | ICD-10-CM | POA: Diagnosis not present

## 2024-05-30 MED ORDER — TRULICITY 3 MG/0.5ML ~~LOC~~ SOAJ
3.0000 mg | SUBCUTANEOUS | 3 refills | Status: AC
  Filled 2024-05-30: qty 6, 84d supply, fill #0
  Filled 2024-05-30: qty 2, 28d supply, fill #0

## 2024-06-09 ENCOUNTER — Ambulatory Visit: Attending: Neurosurgery

## 2024-06-21 ENCOUNTER — Ambulatory Visit

## 2024-06-23 ENCOUNTER — Ambulatory Visit

## 2024-06-27 ENCOUNTER — Ambulatory Visit

## 2024-06-29 ENCOUNTER — Ambulatory Visit

## 2024-07-04 ENCOUNTER — Ambulatory Visit

## 2024-07-06 ENCOUNTER — Ambulatory Visit

## 2024-07-11 ENCOUNTER — Ambulatory Visit

## 2024-07-13 ENCOUNTER — Ambulatory Visit

## 2024-07-19 ENCOUNTER — Ambulatory Visit

## 2024-07-21 ENCOUNTER — Ambulatory Visit

## 2024-07-25 ENCOUNTER — Ambulatory Visit

## 2024-07-27 ENCOUNTER — Ambulatory Visit

## 2024-08-08 ENCOUNTER — Ambulatory Visit: Payer: Self-pay | Admitting: Pharmacist

## 2025-02-28 ENCOUNTER — Ambulatory Visit: Admitting: Adult Health
